# Patient Record
Sex: Female | Born: 1975 | Race: White | Hispanic: No | Marital: Married | Smoking: Former smoker
Health system: Southern US, Community
[De-identification: ages and names within clinical notes are randomized; demographics above are authoritative.]

## PROBLEM LIST (undated history)

## (undated) DIAGNOSIS — K30 Functional dyspepsia: Secondary | ICD-10-CM

## (undated) DIAGNOSIS — G90A Postural orthostatic tachycardia syndrome (POTS): Secondary | ICD-10-CM

## (undated) DIAGNOSIS — E079 Disorder of thyroid, unspecified: Secondary | ICD-10-CM

## (undated) DIAGNOSIS — R51 Headache: Secondary | ICD-10-CM

## (undated) DIAGNOSIS — K219 Gastro-esophageal reflux disease without esophagitis: Secondary | ICD-10-CM

## (undated) DIAGNOSIS — I82A19 Acute embolism and thrombosis of unspecified axillary vein: Secondary | ICD-10-CM

## (undated) DIAGNOSIS — R000 Tachycardia, unspecified: Secondary | ICD-10-CM

## (undated) DIAGNOSIS — R112 Nausea with vomiting, unspecified: Secondary | ICD-10-CM

## (undated) DIAGNOSIS — K589 Irritable bowel syndrome without diarrhea: Secondary | ICD-10-CM

## (undated) DIAGNOSIS — F419 Anxiety disorder, unspecified: Secondary | ICD-10-CM

## (undated) DIAGNOSIS — T4145XA Adverse effect of unspecified anesthetic, initial encounter: Secondary | ICD-10-CM

## (undated) DIAGNOSIS — D573 Sickle-cell trait: Secondary | ICD-10-CM

## (undated) DIAGNOSIS — M519 Unspecified thoracic, thoracolumbar and lumbosacral intervertebral disc disorder: Secondary | ICD-10-CM

## (undated) DIAGNOSIS — N809 Endometriosis, unspecified: Secondary | ICD-10-CM

## (undated) DIAGNOSIS — J309 Allergic rhinitis, unspecified: Secondary | ICD-10-CM

## (undated) DIAGNOSIS — Q078 Other specified congenital malformations of nervous system: Secondary | ICD-10-CM

## (undated) DIAGNOSIS — I498 Other specified cardiac arrhythmias: Secondary | ICD-10-CM

## (undated) DIAGNOSIS — G43909 Migraine, unspecified, not intractable, without status migrainosus: Secondary | ICD-10-CM

## (undated) DIAGNOSIS — T8859XA Other complications of anesthesia, initial encounter: Secondary | ICD-10-CM

## (undated) DIAGNOSIS — Z9889 Other specified postprocedural states: Secondary | ICD-10-CM

## (undated) DIAGNOSIS — J302 Other seasonal allergic rhinitis: Secondary | ICD-10-CM

## (undated) DIAGNOSIS — I951 Orthostatic hypotension: Secondary | ICD-10-CM

## (undated) DIAGNOSIS — D649 Anemia, unspecified: Secondary | ICD-10-CM

## (undated) HISTORY — DX: Disorder of thyroid, unspecified: E07.9

## (undated) HISTORY — DX: Gastro-esophageal reflux disease without esophagitis: K21.9

## (undated) HISTORY — DX: Anemia, unspecified: D64.9

## (undated) HISTORY — DX: Headache: R51

## (undated) HISTORY — DX: Anxiety disorder, unspecified: F41.9

## (undated) HISTORY — DX: Other specified congenital malformations of nervous system: Q07.8

## (undated) HISTORY — DX: Allergic rhinitis, unspecified: J30.9

## (undated) HISTORY — DX: Irritable bowel syndrome, unspecified: K58.9

## (undated) HISTORY — DX: Unspecified thoracic, thoracolumbar and lumbosacral intervertebral disc disorder: M51.9

## (undated) HISTORY — DX: Sickle-cell trait: D57.3

## (undated) HISTORY — DX: Endometriosis, unspecified: N80.9

## (undated) HISTORY — DX: Other seasonal allergic rhinitis: J30.2

## (undated) HISTORY — DX: Acute embolism and thrombosis of unspecified axillary vein: I82.A19

## (undated) HISTORY — PX: WISDOM TOOTH EXTRACTION: SHX21

## (undated) HISTORY — DX: Functional dyspepsia: K30

---
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
## 1994-04-30 HISTORY — PX: TONSILLECTOMY: SUR1361

## 2000-12-05 ENCOUNTER — Other Ambulatory Visit: Admission: RE | Admit: 2000-12-05 | Discharge: 2000-12-05 | Payer: Self-pay | Admitting: Gynecology

## 2001-12-08 ENCOUNTER — Other Ambulatory Visit: Admission: RE | Admit: 2001-12-08 | Discharge: 2001-12-08 | Payer: Self-pay | Admitting: Gynecology

## 2002-12-08 ENCOUNTER — Other Ambulatory Visit: Admission: RE | Admit: 2002-12-08 | Discharge: 2002-12-08 | Payer: Self-pay | Admitting: Gynecology

## 2003-12-06 ENCOUNTER — Other Ambulatory Visit: Admission: RE | Admit: 2003-12-06 | Discharge: 2003-12-06 | Payer: Self-pay | Admitting: Obstetrics and Gynecology

## 2004-06-20 ENCOUNTER — Inpatient Hospital Stay (HOSPITAL_COMMUNITY): Admission: AD | Admit: 2004-06-20 | Discharge: 2004-06-23 | Payer: Self-pay | Admitting: Obstetrics and Gynecology

## 2005-05-23 ENCOUNTER — Encounter: Admission: RE | Admit: 2005-05-23 | Discharge: 2005-05-23 | Payer: Self-pay | Admitting: Internal Medicine

## 2005-10-10 ENCOUNTER — Other Ambulatory Visit: Admission: RE | Admit: 2005-10-10 | Discharge: 2005-10-10 | Payer: Self-pay | Admitting: Obstetrics and Gynecology

## 2006-05-03 ENCOUNTER — Emergency Department (HOSPITAL_COMMUNITY): Admission: EM | Admit: 2006-05-03 | Discharge: 2006-05-03 | Payer: Self-pay | Admitting: Family Medicine

## 2006-12-10 ENCOUNTER — Emergency Department (HOSPITAL_COMMUNITY): Admission: EM | Admit: 2006-12-10 | Discharge: 2006-12-11 | Payer: Self-pay | Admitting: Emergency Medicine

## 2007-01-10 ENCOUNTER — Ambulatory Visit: Payer: Self-pay | Admitting: Internal Medicine

## 2007-01-14 ENCOUNTER — Ambulatory Visit: Payer: Self-pay | Admitting: Internal Medicine

## 2007-01-14 LAB — CONVERTED CEMR LAB
AST: 22 units/L (ref 0–37)
Albumin: 4.1 g/dL (ref 3.5–5.2)
Amylase: 73 units/L (ref 27–131)
Bilirubin Urine: NEGATIVE
Bilirubin, Direct: 0.1 mg/dL (ref 0.0–0.3)
CO2: 31 meq/L (ref 19–32)
Cortisol, Plasma: 20.6 ug/dL
Creatinine, Ser: 0.7 mg/dL (ref 0.4–1.2)
Eosinophils Absolute: 0 10*3/uL (ref 0.0–0.6)
Eosinophils Relative: 0.3 % (ref 0.0–5.0)
GFR calc Af Amer: 125 mL/min
GFR calc non Af Amer: 103 mL/min
HCT: 41.3 % (ref 36.0–46.0)
MCV: 83.4 fL (ref 78.0–100.0)
Mono Screen: NEGATIVE
Monocytes Relative: 5.8 % (ref 3.0–11.0)
Neutro Abs: 6.9 10*3/uL (ref 1.4–7.7)
Neutrophils Relative %: 76.2 % (ref 43.0–77.0)
Nitrite: NEGATIVE
Platelets: 226 10*3/uL (ref 150–400)
Potassium: 4.4 meq/L (ref 3.5–5.1)
RDW: 11.5 % (ref 11.5–14.6)
Sodium: 142 meq/L (ref 135–145)
Specific Gravity, Urine: <=1.005 (ref 1.000–1.03)
TSH: 2.85 microintl units/mL (ref 0.35–5.50)
Total Bilirubin: 0.9 mg/dL (ref 0.3–1.2)
Total Protein: 7.3 g/dL (ref 6.0–8.3)
Urine Glucose: NEGATIVE mg/dL
WBC: 9 10*3/uL (ref 4.5–10.5)

## 2007-01-15 ENCOUNTER — Ambulatory Visit: Payer: Self-pay | Admitting: Internal Medicine

## 2007-01-22 ENCOUNTER — Ambulatory Visit: Payer: Self-pay | Admitting: Internal Medicine

## 2007-01-22 LAB — CONVERTED CEMR LAB
CO2: 31 meq/L (ref 19–32)
Calcium: 9.2 mg/dL (ref 8.4–10.5)
Chloride: 106 meq/L (ref 96–112)
Potassium: 3.6 meq/L (ref 3.5–5.1)

## 2007-02-03 ENCOUNTER — Ambulatory Visit: Payer: Self-pay | Admitting: Internal Medicine

## 2007-02-03 LAB — CONVERTED CEMR LAB
BUN: 8 mg/dL (ref 6–23)
CO2: 30 meq/L (ref 19–32)
Calcium: 9.1 mg/dL (ref 8.4–10.5)
Chloride: 100 meq/L (ref 96–112)
Sodium: 137 meq/L (ref 135–145)

## 2007-02-13 ENCOUNTER — Ambulatory Visit: Payer: Self-pay | Admitting: Internal Medicine

## 2007-03-21 ENCOUNTER — Ambulatory Visit: Payer: Self-pay | Admitting: Internal Medicine

## 2007-03-24 ENCOUNTER — Ambulatory Visit: Payer: Self-pay | Admitting: Internal Medicine

## 2007-03-24 LAB — CONVERTED CEMR LAB
Bilirubin Urine: NEGATIVE
CO2: 30 meq/L (ref 19–32)
Creatinine, Ser: 0.5 mg/dL (ref 0.4–1.2)
Crystals: NEGATIVE
GFR calc Af Amer: 184 mL/min
GFR calc non Af Amer: 152 mL/min
Glucose, Bld: 99 mg/dL (ref 70–99)
Potassium: 3.9 meq/L (ref 3.5–5.1)
RBC / HPF: NONE SEEN
Sodium: 139 meq/L (ref 135–145)
Specific Gravity, Urine: <=1.005 (ref 1.000–1.03)
Total Protein, Urine: NEGATIVE mg/dL
Urine Glucose: NEGATIVE mg/dL
Urobilinogen, UA: 0.2 (ref 0.0–1.0)
pH: 5.5 (ref 5.0–8.0)

## 2007-04-07 ENCOUNTER — Ambulatory Visit: Payer: Self-pay | Admitting: Internal Medicine

## 2007-04-22 ENCOUNTER — Ambulatory Visit (HOSPITAL_COMMUNITY): Admission: RE | Admit: 2007-04-22 | Discharge: 2007-04-22 | Payer: Self-pay | Admitting: Internal Medicine

## 2007-05-29 ENCOUNTER — Ambulatory Visit: Payer: Self-pay | Admitting: Internal Medicine

## 2007-05-29 LAB — CONVERTED CEMR LAB
Basophils Relative: 0.1 % (ref 0.0–1.0)
GFR calc Af Amer: 125 mL/min
Glucose, Bld: 86 mg/dL (ref 70–99)
MCHC: 34.5 g/dL (ref 30.0–36.0)
Magnesium: 2 mg/dL (ref 1.5–2.5)
Monocytes Relative: 7.4 % (ref 3.0–11.0)

## 2007-06-05 ENCOUNTER — Ambulatory Visit: Payer: Self-pay | Admitting: Internal Medicine

## 2007-06-05 LAB — CONVERTED CEMR LAB
BUN: 5 mg/dL — ABNORMAL LOW (ref 6–23)
Calcium: 9 mg/dL (ref 8.4–10.5)
Chloride: 107 meq/L (ref 96–112)
Creatinine, Ser: 0.7 mg/dL (ref 0.4–1.2)
GFR calc Af Amer: 125 mL/min
GFR calc non Af Amer: 103 mL/min
Sodium: 141 meq/L (ref 135–145)

## 2007-06-23 ENCOUNTER — Ambulatory Visit: Payer: Self-pay | Admitting: Internal Medicine

## 2007-06-23 LAB — CONVERTED CEMR LAB
CO2: 29 meq/L (ref 19–32)
Calcium: 9.2 mg/dL (ref 8.4–10.5)
Chloride: 103 meq/L (ref 96–112)
Creatinine, Ser: 0.7 mg/dL (ref 0.4–1.2)
GFR calc Af Amer: 125 mL/min
Magnesium: 2 mg/dL (ref 1.5–2.5)
Total CK: 66 units/L (ref 7–177)

## 2007-07-21 DIAGNOSIS — N809 Endometriosis, unspecified: Secondary | ICD-10-CM | POA: Insufficient documentation

## 2007-08-25 ENCOUNTER — Emergency Department (HOSPITAL_COMMUNITY): Admission: EM | Admit: 2007-08-25 | Discharge: 2007-08-26 | Payer: Self-pay | Admitting: Emergency Medicine

## 2007-08-28 ENCOUNTER — Ambulatory Visit: Payer: Self-pay | Admitting: Internal Medicine

## 2007-09-04 ENCOUNTER — Telehealth: Payer: Self-pay | Admitting: Internal Medicine

## 2007-09-08 ENCOUNTER — Ambulatory Visit: Payer: Self-pay | Admitting: Internal Medicine

## 2007-09-08 DIAGNOSIS — K589 Irritable bowel syndrome without diarrhea: Secondary | ICD-10-CM | POA: Insufficient documentation

## 2007-09-09 LAB — CONVERTED CEMR LAB
Eosinophils Absolute: 0.1 10*3/uL (ref 0.0–0.7)
Eosinophils Relative: 0.7 % (ref 0.0–5.0)
Ferritin: 22.4 ng/mL (ref 10.0–291.0)
HCT: 40.6 % (ref 36.0–46.0)
Neutro Abs: 5.1 10*3/uL (ref 1.4–7.7)
RBC: 4.89 M/uL (ref 3.87–5.11)
WBC: 7.9 10*3/uL (ref 4.5–10.5)

## 2007-12-17 ENCOUNTER — Ambulatory Visit: Payer: Self-pay | Admitting: Cardiology

## 2007-12-26 ENCOUNTER — Ambulatory Visit: Payer: Self-pay | Admitting: Internal Medicine

## 2007-12-26 DIAGNOSIS — G90A Postural orthostatic tachycardia syndrome (POTS): Secondary | ICD-10-CM | POA: Insufficient documentation

## 2007-12-26 DIAGNOSIS — I951 Orthostatic hypotension: Secondary | ICD-10-CM

## 2007-12-26 DIAGNOSIS — D649 Anemia, unspecified: Secondary | ICD-10-CM

## 2007-12-26 DIAGNOSIS — R000 Tachycardia, unspecified: Secondary | ICD-10-CM

## 2008-01-01 ENCOUNTER — Encounter: Admission: RE | Admit: 2008-01-01 | Discharge: 2008-01-01 | Payer: Self-pay | Admitting: Internal Medicine

## 2008-01-02 ENCOUNTER — Telehealth: Payer: Self-pay | Admitting: Internal Medicine

## 2008-01-06 ENCOUNTER — Telehealth: Payer: Self-pay | Admitting: Internal Medicine

## 2008-03-09 ENCOUNTER — Telehealth: Payer: Self-pay | Admitting: Internal Medicine

## 2008-03-15 ENCOUNTER — Encounter: Payer: Self-pay | Admitting: Internal Medicine

## 2008-03-23 ENCOUNTER — Ambulatory Visit: Payer: Self-pay | Admitting: Internal Medicine

## 2008-04-20 ENCOUNTER — Ambulatory Visit: Payer: Self-pay | Admitting: Internal Medicine

## 2008-04-20 LAB — CONVERTED CEMR LAB: Iron: 167 ug/dL — ABNORMAL HIGH (ref 42–145)

## 2008-04-30 HISTORY — PX: COLONOSCOPY: SHX174

## 2008-05-03 ENCOUNTER — Telehealth: Payer: Self-pay | Admitting: Internal Medicine

## 2008-05-17 ENCOUNTER — Ambulatory Visit: Payer: Self-pay | Admitting: Internal Medicine

## 2008-06-10 ENCOUNTER — Ambulatory Visit: Payer: Self-pay | Admitting: Internal Medicine

## 2008-09-14 ENCOUNTER — Encounter: Payer: Self-pay | Admitting: Internal Medicine

## 2008-10-06 ENCOUNTER — Encounter: Payer: Self-pay | Admitting: Internal Medicine

## 2008-11-03 ENCOUNTER — Telehealth: Payer: Self-pay | Admitting: Internal Medicine

## 2008-11-05 ENCOUNTER — Encounter: Payer: Self-pay | Admitting: Internal Medicine

## 2008-11-16 ENCOUNTER — Ambulatory Visit: Payer: Self-pay | Admitting: Internal Medicine

## 2008-11-26 ENCOUNTER — Encounter (INDEPENDENT_AMBULATORY_CARE_PROVIDER_SITE_OTHER): Payer: Self-pay | Admitting: *Deleted

## 2008-12-15 ENCOUNTER — Encounter: Payer: Self-pay | Admitting: Internal Medicine

## 2008-12-29 ENCOUNTER — Telehealth: Payer: Self-pay | Admitting: Internal Medicine

## 2008-12-31 ENCOUNTER — Encounter: Payer: Self-pay | Admitting: Internal Medicine

## 2009-01-28 ENCOUNTER — Encounter: Payer: Self-pay | Admitting: Internal Medicine

## 2009-02-17 ENCOUNTER — Ambulatory Visit: Payer: Self-pay | Admitting: Internal Medicine

## 2009-02-28 ENCOUNTER — Encounter: Payer: Self-pay | Admitting: Internal Medicine

## 2009-03-31 ENCOUNTER — Encounter: Payer: Self-pay | Admitting: Internal Medicine

## 2009-04-01 ENCOUNTER — Ambulatory Visit: Payer: Self-pay | Admitting: Internal Medicine

## 2009-07-11 ENCOUNTER — Encounter: Admission: RE | Admit: 2009-07-11 | Discharge: 2009-07-11 | Payer: Self-pay | Admitting: Obstetrics and Gynecology

## 2009-07-14 ENCOUNTER — Encounter: Payer: Self-pay | Admitting: Internal Medicine

## 2009-08-11 ENCOUNTER — Encounter: Payer: Self-pay | Admitting: Internal Medicine

## 2009-08-30 ENCOUNTER — Telehealth: Payer: Self-pay | Admitting: Internal Medicine

## 2009-09-09 ENCOUNTER — Encounter: Payer: Self-pay | Admitting: Internal Medicine

## 2009-10-27 ENCOUNTER — Encounter: Payer: Self-pay | Admitting: Internal Medicine

## 2009-11-17 ENCOUNTER — Encounter: Admission: RE | Admit: 2009-11-17 | Discharge: 2009-11-17 | Payer: Self-pay | Admitting: Orthopedic Surgery

## 2009-12-05 ENCOUNTER — Telehealth: Payer: Self-pay | Admitting: *Deleted

## 2009-12-26 ENCOUNTER — Encounter: Payer: Self-pay | Admitting: Internal Medicine

## 2010-01-17 ENCOUNTER — Encounter: Payer: Self-pay | Admitting: Internal Medicine

## 2010-01-18 ENCOUNTER — Encounter: Payer: Self-pay | Admitting: Internal Medicine

## 2010-01-22 ENCOUNTER — Encounter: Payer: Self-pay | Admitting: Internal Medicine

## 2010-01-23 ENCOUNTER — Encounter: Payer: Self-pay | Admitting: Internal Medicine

## 2010-01-25 ENCOUNTER — Telehealth: Payer: Self-pay | Admitting: Internal Medicine

## 2010-01-26 ENCOUNTER — Encounter: Payer: Self-pay | Admitting: Internal Medicine

## 2010-02-23 ENCOUNTER — Ambulatory Visit: Payer: Self-pay | Admitting: Internal Medicine

## 2010-04-05 ENCOUNTER — Ambulatory Visit: Payer: Self-pay | Admitting: Internal Medicine

## 2010-04-30 LAB — HM COLONOSCOPY

## 2010-05-03 ENCOUNTER — Ambulatory Visit
Admission: RE | Admit: 2010-05-03 | Discharge: 2010-05-03 | Payer: Self-pay | Source: Home / Self Care | Attending: Internal Medicine | Admitting: Internal Medicine

## 2010-05-03 DIAGNOSIS — J019 Acute sinusitis, unspecified: Secondary | ICD-10-CM | POA: Insufficient documentation

## 2010-05-03 DIAGNOSIS — J309 Allergic rhinitis, unspecified: Secondary | ICD-10-CM | POA: Insufficient documentation

## 2010-05-03 DIAGNOSIS — F411 Generalized anxiety disorder: Secondary | ICD-10-CM | POA: Insufficient documentation

## 2010-05-21 ENCOUNTER — Encounter: Payer: Self-pay | Admitting: Internal Medicine

## 2010-05-28 ENCOUNTER — Encounter: Payer: Self-pay | Admitting: Internal Medicine

## 2010-05-30 NOTE — Progress Notes (Signed)
Summary: refills  Phone Note From Pharmacy   Caller: [facility] [address]. [phone]* Reason for Call: Needs renewal Details for Reason: celexa and allegra Initial call taken by: Romualdo Bolk, CMA Duncan Dull),  December 05, 2009 11:38 AM  Follow-up for Phone Call        Rx sent to pharmacy Follow-up by: Romualdo Bolk, CMA Duncan Dull),  December 05, 2009 11:39 AM    Prescriptions: ALLEGRA-D 12 HOUR 60-120 MG  TB12 (FEXOFENADINE-PSEUDOEPHEDRINE) Take 1 tablet by mouth once a day  #90.0 Each x 0   Entered by:   Romualdo Bolk, CMA (AAMA)   Authorized by:   Gordy Savers  MD   Signed by:   Romualdo Bolk, CMA (AAMA) on 12/05/2009   Method used:   Electronically to        [facility] [address] [phone]* (retail)       [address]       [place], [place]  [address]       Ph: [phone] or [phone]       Fax: [phone]   RxID:   [id number] CELEXA 20 MG TABS (CITALOPRAM HYDROBROMIDE) Take 1 tablet by mouth once a day  #90 x 0   Entered by:   Romualdo Bolk, CMA (AAMA)   Authorized by:   Gordy Savers  MD   Signed by:   Romualdo Bolk, CMA (AAMA) on 12/05/2009   Method used:   Electronically to        [facility] [address] [phone]* (retail)       [address]       [place], [place]  [address]       Ph: [phone] or [phone]       Fax: [phone]   RxID:   [id number]

## 2010-05-30 NOTE — Miscellaneous (Signed)
Summary: Home Health Certification/Care Plan  Home Health Certification/Care Plan   Imported By: Roderic Ovens 08/03/2009 14:29:38  _____________________________________________________________________  External Attachment:    Type:   Image     Comment:   External Document

## 2010-05-30 NOTE — Letter (Signed)
Summary: Home Health Cert and Plan of Care  Home Health Cert and Plan of Care   Imported By: Marylou Mccoy 02/07/2010 12:01:37  _____________________________________________________________________  External Attachment:    Type:   Image     Comment:   External Document

## 2010-05-30 NOTE — Miscellaneous (Signed)
Summary: Flu Shot/[facility]  [facility]   Imported By: Maryln Gottron 01/20/2010 14:51:55  _____________________________________________________________________  External Attachment:    Type:   Image     Comment:   External Document

## 2010-05-30 NOTE — Progress Notes (Signed)
Summary: home care service -9 weeks   Phone Note From Other Clinic   [facility]er[facility] [phone]  Request: Talk with Nurse Summary of Call: pt getting home care service for next 9 weeks. Initial call taken by: Lorne Skeens,  Aug 30, 2009 11:28 AM  Follow-up for Phone Call        08/30/09--11:30 am--LMTCB--nt  returning call, please call main ofc [phone] ask to speak with the blue team, Migdalia Dk  Aug 30, 2009 11:45 AM   Additional Follow-up for Phone Call Additional follow up Details #1::        08/30/09--12noon--[facility](autumn)--calling about continuing fluid administation to ms younce for next 9 weeks--order was given to continue with fluid administration for next 9 weeks--nt Additional Follow-up by: Ledon Snare, RN,  Aug 30, 2009 12:18 PM

## 2010-05-30 NOTE — Assessment & Plan Note (Signed)
Summary: severe headaches//ccm    Vital Signs:  Patient profile:   35 year old female Weight:      156 pounds Temp:     98.2 degrees F oral BP sitting:   128 / 100  (right arm) Cuff size:   regular  Vitals Entered By: Duard Brady LPN (April 05, 2010 10:19 AM) CC: c/o headache x2wks , muscle pain, neck pain, nausea Is Patient Diabetic? No   Primary Care Provider:  Gordy Savers  MD  CC:  c/o headache x2wks , muscle pain, neck pain, and nausea.  History of Present Illness: 35 year old patient who has a history of dysautonomia.  The past couple weeks, she has had some neck pain associated with headaches.  She describes some sinus symptoms, and left ear discomfort.  There's been no fever.  Her chief complaint today is headaches.  She has prescribed recently anti-inflammatory medication for her low back, which he is not started taking it.  She has been using Advil p.r.n. with some benefit.  She does have muscle relaxers, that she takes with improvement.  Allergies: 1)  Pcn 2)  Sulfa 3)  Erythromycin 4)  Amoxicillin  Past History:  Past Medical History: Reviewed history from 06/10/2008 and no changes required. ENDOMETRIOSIS (ICD-617.9) Dysautonomia (POTS) Seasonal allergic rhinitis lumbar disk disease Anemia-NOS Headache gravida one, para one, abortus zero  Past Surgical History: Reviewed history from 07/21/2007 and no changes required. Ankle Reconstruction (1993) Tonsillectomy (1996)  Review of Systems       The patient complains of anorexia and headaches.  The patient denies fever, weight loss, weight gain, vision loss, decreased hearing, hoarseness, chest pain, syncope, dyspnea on exertion, peripheral edema, prolonged cough, hemoptysis, abdominal pain, melena, hematochezia, severe indigestion/heartburn, hematuria, incontinence, genital sores, muscle weakness, suspicious skin lesions, transient blindness, difficulty walking, depression, unusual weight  change, abnormal bleeding, enlarged lymph nodes, angioedema, and breast masses.    Physical Exam  General:  overweight-appearing.  110/70 Head:  Normocephalic and atraumatic without obvious abnormalities. No apparent alopecia or balding. Eyes:  No corneal or conjunctival inflammation noted. EOMI. Perrla. Funduscopic exam benign, without hemorrhages, exudates or papilledema. Vision grossly normal. Ears:  External ear exam shows no significant lesions or deformities.  Otoscopic examination reveals clear canals, tympanic membranes are intact bilaterally without bulging, retraction, inflammation or discharge. Hearing is grossly normal bilaterally. Mouth:  Oral mucosa and oropharynx without lesions or exudates.  Teeth in good repair. Neck:  No deformities, masses, or tenderness noted. Lungs:  Normal respiratory effort, chest expands symmetrically. Lungs are clear to auscultation, no crackles or wheezes. Heart:  Normal rate and regular rhythm. S1 and S2 normal without gallop, murmur, click, rub or other extra sounds. Abdomen:  Bowel sounds positive,abdomen soft and non-tender without masses, organomegaly or hernias noted.   Impression & Recommendations:  Problem # 1:  DYSAUTONOMIA (ICD-742.8)  Problem # 2:  HEADACHE (ICD-784.0)  Her updated medication list for this problem includes:    Pindolol 5 Mg Tabs (Pindolol) .Marland Kitchen... Take one tablet by mouth twice daily. probable secondary to musculoligamentous neck pain-  Will start anti-inflammatories and will observe  Complete Medication List: 1)  Celexa 20 Mg Tabs (Citalopram hydrobromide) .... Take 1 tablet by mouth once a day 2)  Magnesium Oxide 400 Mg Tabs (Magnesium oxide) .Marland Kitchen.. 1 tablet by mouth once a day 3)  Fludrocortisone Acetate 0.1 Mg Tabs (Fludrocortisone acetate) .Marland Kitchen.. 1 1/2 tabs two times a day 4)  Pindolol 5 Mg Tabs (Pindolol) .... Take  one tablet by mouth twice daily. 5)  Mestinon 180 Mg Cr-tabs (Pyridostigmine bromide) .Marland Kitchen.. 1 tab once  daily 6)  Allegra-d 12 Hour 60-120 Mg Tb12 (Fexofenadine-pseudoephedrine) .... Take 1 tablet by mouth once a day 7)  Yaz 3-0.02 Mg Tabs (Drospirenone-ethinyl estradiol) .... As directed 8)  Anusol-hc 25 Mg Supp (Hydrocortisone acetate) .Marland Kitchen.. 1 at bedtime for 7 nights then as needed 9)  Miralax Pack (Polyethylene glycol 3350) .... Q am 10)  Curity Sterile Saline 0.9 % Soln (Sodium chloride (gu irrigant)) .... Q week 11)  Rhinocort Aqua 32 Mcg/act Susp (Budesonide) .... Use daily 12)  Cobal-1000 1000 Mcg/ml Soln (Cyanocobalamin) .Marland Kitchen.. 1 injection monthly 13)  Nu-iron 150 Mg Caps (Polysaccharide iron complex) .Marland Kitchen.. 1 cap once daily 14)  Cyclobenzaprine Hcl 5 Mg Tabs (Cyclobenzaprine hcl) .Marland Kitchen.. 1-2 tabs q 8 hours as needed 15)  Colace 100 Mg Caps (Docusate sodium) .... Once daily and prn  Patient Instructions: 1)  Please schedule a follow-up appointment in 3 months. 2)  call if unimproved in two or 3 days   Orders Added: 1)  Est. Patient Level III [28413]

## 2010-05-30 NOTE — Progress Notes (Signed)
Summary: refill celexa and allergra  Phone Note Refill Request Message from:  Fax from Pharmacy on January 25, 2010 9:48 AM  Refills Requested: Medication #1:  ALLEGRA-D 12 HOUR 60-120 MG  TB12 Take 1 tablet by mouth once a day  Medication #2:  CELEXA 20 MG TABS Take 1 tablet by mouth once a day [facility]   Method Requested: Fax to Local Pharmacy Initial call taken by: Duard Brady LPN,  January 25, 2010 9:49 AM    Prescriptions: ALLEGRA-D 12 HOUR 60-120 MG  TB12 (FEXOFENADINE-PSEUDOEPHEDRINE) Take 1 tablet by mouth once a day  #90.0 Each x 1   Entered by:   Duard Brady LPN   Authorized by:   Gordy Savers  MD   Signed by:   Duard Brady LPN on 09/81/1914   Method used:   Faxed to ...       [facility] (mail-order)             , [place]         Ph: [phone]       Fax: [phone]   RxID:   [id number] CELEXA 20 MG TABS (CITALOPRAM HYDROBROMIDE) Take 1 tablet by mouth once a day  #90 x 1   Entered by:   Duard Brady LPN   Authorized by:   Gordy Savers  MD   Signed by:   Duard Brady LPN on 53/66/4403   Method used:   Faxed to ...       [facility] (mail-order)             , [place]         Ph: [phone]       Fax: [phone]   RxID:   [id number]  faxed to Toni Arthurs

## 2010-05-30 NOTE — Letter (Signed)
[facility]   Imported By: Marylou Mccoy 11/22/2009 13:35:50  _____________________________________________________________________  External Attachment:    Type:   Image     Comment:   External Document

## 2010-05-30 NOTE — Miscellaneous (Signed)
Summary: Home Health Certification/Care Plan  Home Health Certification/Care Plan   Imported By: Roderic Ovens 10/28/2009 11:36:50  _____________________________________________________________________  External Attachment:    Type:   Image     Comment:   External Document

## 2010-05-30 NOTE — Miscellaneous (Signed)
Summary: flu vaccine   Clinical Lists Changes  Observations: Added new observation of FLU VAX: Historical (01/17/2010 7:55)      Immunization History:  Influenza Immunization History:    Influenza:  Historical (01/17/2010) given at [facility]

## 2010-05-30 NOTE — Assessment & Plan Note (Signed)
Summary: yearly/sl  Medications Added PINDOLOL 5 MG  TABS (PINDOLOL) Take one tablet by mouth twice daily. MESTINON 180 MG CR-TABS (PYRIDOSTIGMINE BROMIDE) 1 tab once daily COBAL-1000 1000 MCG/ML SOLN (CYANOCOBALAMIN) 1 injection monthly NU-IRON 150 MG CAPS (POLYSACCHARIDE IRON COMPLEX) 1 cap once daily CYCLOBENZAPRINE HCL 5 MG TABS (CYCLOBENZAPRINE HCL) 1-2 tabs q 8 hours as needed        Visit Type:  1 yr f/u Primary Provider:  Gordy Savers  MD  CC:  sob frequently....edema/everywhere....denies any cp....pt states she has had headaches for the past couple of days says due to allergies.  History of Present Illness: patient is a 35 year old with a history of dysautonomia.  I saw her in the [place] of 2010. Since then she has done fairly well.  She continues on IV fluids every other week. She still notes dizziness at times but not as bad as in the past.  Continues to note nausea.  Still has intermiitent headaches.  Continues to have cramps in hands, legs.  electrolytes were checked and normal.   She remains very active.  She is working 4 hrs per day.  Daughter is in 1st grade.  Keeps up with her activites.  Current Medications (verified): 1)  Celexa 20 Mg Tabs (Citalopram Hydrobromide) .... Take 1 Tablet By Mouth Once A Day 2)  Magnesium Oxide 400 Mg Tabs (Magnesium Oxide) .Marland Kitchen.. 1 Tablet By Mouth Once A Day 3)  Fludrocortisone Acetate 0.1 Mg  Tabs (Fludrocortisone Acetate) .Marland Kitchen.. 1 1/2 Tabs Two Times A Day 4)  Pindolol 5 Mg  Tabs (Pindolol) .... Take One Tablet By Mouth Twice Daily. 5)  Mestinon 180 Mg Cr-Tabs (Pyridostigmine Bromide) .Marland Kitchen.. 1 Tab Once Daily 6)  Allegra-D 12 Hour 60-120 Mg  Tb12 (Fexofenadine-Pseudoephedrine) .... Take 1 Tablet By Mouth Once A Day 7)  Yaz 3-0.02 Mg  Tabs (Drospirenone-Ethinyl Estradiol) .... As Directed 8)  Anusol-Hc 25 Mg  Supp (Hydrocortisone Acetate) .Marland Kitchen.. 1 At Bedtime For 7 Nights Then As Needed 9)  Miralax  Pack (Polyethylene Glycol 3350) .... Q  Am 10)  Phillips 500 Mg Tabs (Magnesium Oxide) .Marland Kitchen.. 1 At Bedtime 11)  Curity Sterile Saline 0.9 % Soln (Sodium Chloride (Gu Irrigant)) .... Q Week 12)  Rhinocort Aqua 32 Mcg/act Susp (Budesonide) .... Use Daily 13)  Cobal-1000 1000 Mcg/ml Soln (Cyanocobalamin) .Marland Kitchen.. 1 Injection Monthly 14)  Nu-Iron 150 Mg Caps (Polysaccharide Iron Complex) .Marland Kitchen.. 1 Cap Once Daily 15)  Cyclobenzaprine Hcl 5 Mg Tabs (Cyclobenzaprine Hcl) .Marland Kitchen.. 1-2 Tabs Q 8 Hours As Needed  Allergies: 1)  Pcn 2)  Sulfa 3)  Erythromycin 4)  Amoxicillin  Past History:  Past medical, surgical, family and social histories (including risk factors) reviewed, and no changes noted (except as noted below).  Past Medical History: Reviewed history from 06/10/2008 and no changes required. ENDOMETRIOSIS (ICD-617.9) Dysautonomia (POTS) Seasonal allergic rhinitis lumbar disk disease Anemia-NOS Headache gravida one, para one, abortus zero  Past Surgical History: Reviewed history from 07/21/2007 and no changes required. Ankle Reconstruction (1993) Tonsillectomy (1996)  Family History: Reviewed history from 12/26/2007 and no changes required. No FH of Colon Cancer: details of her biological father's health unknown mother, age 36, status post cholecystectomy, history of hypo-thyroidism, and history of migraine headaches  One biological sister is well  Social History: Reviewed history from 09/08/2007 and no changes required. Occupation: admin work part-time  Patient is a former smoker.  Alcohol Use - yes occasional wine Married 1 child (girl) Dad is Majel Homer  Review of Systems       All systems reviewed.  Neg to the above problem except as noted abvoe.  Vital Signs:  Patient profile:   35 year old female Height:      66 inches Weight:      158 pounds BMI:     25.59 Pulse rate:   69 / minute Pulse (ortho):   75 / minute Pulse rhythm:   regular BP sitting:   122 / 80  (left arm) BP standing:   122 / 84 Cuff  size:   regular  Vitals Entered By: Danielle Rankin, CMA (February 23, 2010 4:18 PM)  Serial Vital Signs/Assessments:  Time      Position  BP       Pulse  Resp  Temp     By 4:30 PM   Lying [place]  122/80   69                    Danielle Rankin, CMA 4:31 PM   Sitting   126/82   [address] [place] 4:32 PM   Standing  122/84   [address], [place] 4:34 PM   Standing  127/85   [address], [place] 4:35 PM   Standing  124/84   79                    Danielle Rankin, [place]  Comments: 4:30 PM no sxms By: Danielle Rankin, CMA  4:31 PM pt states feels like her head is going to explode By: Danielle Rankin, CMA  4:32 PM no sxms By: Danielle Rankin, CMA  4:34 PM no sxms By: Danielle Rankin, CMA  4:35 PM no sxms By: Danielle Rankin, CMA    Physical Exam  Additional Exam:  Patinet is in NAD HEENT:  Normocephalic, atraumatic. EOMI, PERRLA.  Neck: JVP is normal. No thyromegaly. No bruits.  Lungs: clear to auscultation. No rales no wheezes.  Heart: Regular rate and rhythm. Normal S1, S2. No S3.   No significant murmurs. PMI not displaced.  Abdomen:  Supple, nontender. Normal bowel sounds. No masses. No hepatomegaly.  Extremities:   Good distal pulses throughout. No lower extremity edema.  Musculoskeletal :moving all extremities.  Neuro:   alert and oriented x3.    Impression & Recommendations:  Problem # 1:  DYSAUTONOMIA (ICD-742.8) patient has done fairly well this past year.  I would keep her on the same regimen for now.  I would recomm that she stays active.  Will look into protocols for bike/rowing.

## 2010-05-30 NOTE — Miscellaneous (Signed)
Summary: [facility] Verbal Orders  [facility]   Imported By: Roderic Ovens 03/06/2010 09:04:35  _____________________________________________________________________  External Attachment:    Type:   Image     Comment:   External Document

## 2010-05-30 NOTE — Letter (Signed)
Summary: [facility]   [facility]   Imported By: Marylou Mccoy 03/03/2010 14:54:23  _____________________________________________________________________  External Attachment:    Type:   Image     Comment:   External Document

## 2010-05-30 NOTE — Miscellaneous (Signed)
Summary: Home Health Certification/Care Plan  Home Health Certification/Care Plan   Imported By: Roderic Ovens 08/24/2009 16:33:28  _____________________________________________________________________  External Attachment:    Type:   Image     Comment:   External Document

## 2010-05-31 ENCOUNTER — Encounter: Payer: Self-pay | Admitting: Internal Medicine

## 2010-06-01 NOTE — Assessment & Plan Note (Signed)
Summary: New/BCBS/#/cd   Vital Signs:  Patient profile:   35 year old female Height:      66 inches (167.64 cm) Weight:      158.4 pounds (72 kg) O2 Sat:      98 % on Room air Temp:     98.9 degrees F (37.17 degrees C) oral Pulse rate:   69 / minute BP sitting:   118 / 90  (left arm) Cuff size:   regular  Vitals Entered By: Orlan Leavens RMA (May 03, 2010 9:41 AM)  O2 Flow:  Room air CC: New patient, URI symptoms Is Patient Diabetic? No Pain Assessment Patient in pain? no      Comments C/o nasal & chest congestion, been taking Thera flu ovc not getting better   Primary Care Provider:  Newt Lukes MD  CC:  New patient and URI symptoms.  History of Present Illness: new pt to me but known to our diivison - transfer from [place] office where she followed with PK  URI Symptoms      This is a 35 year old woman who presents with URI symptoms.  The symptoms began 3 weeks ago.  The severity is described as moderate.  not improved with otc meds in past 1 week.  The patient reports nasal congestion, purulent nasal discharge, sore throat, productive cough, and earache, but denies clear nasal discharge and dry cough.  Associated symptoms include fever, use of an antipyretic, and response to antipyretic.  The patient denies dyspnea, wheezing, vomiting, and diarrhea.  The patient also reports itchy watery eyes, itchy throat, headache, muscle aches, and severe fatigue.  The patient denies seasonal symptoms and response to antihistamine.  Risk factors for Strep sinusitis include tooth pain.  The patient denies the following risk factors for Strep sinusitis: Strep exposure, tender adenopathy, and absence of cough.    also reviewed chronic med issues 1) dysautonomia -dx 2008 by symptoms locally, clarified dx 2009 at [facility] with neuro - med tx controlling most sy (fludrocort, pindolol, mestinon and cymbalta) - no recent changes in meds or symptoms - IVF every weekend at home via PIV and  HHRN (2L for symptom mgmt)  2) IBS and "GI" problems - relates to autonomic dysfx as for POTS (see above) - follows with GI specialist in autonomic dysfx at [facility] for same - no change in symptoms, bowels or meds - take Mg, iron and B12 shots for same  3) allg rhinitis - infreq use of otc antihist/decong or nasal spray - worse seasonally -  Preventive Screening-Counseling & Management  Alcohol-Tobacco     Alcohol drinks/day: <1     Alcohol Counseling: not indicated; use of alcohol is not excessive or problematic     Smoking Status: never     Tobacco Counseling: not indicated; no tobacco use  Caffeine-Diet-Exercise     Does Patient Exercise: no     Exercise Counseling: to improve exercise regimen     Depression Counseling: not indicated; screening negative for depression  Safety-Violence-Falls     Seat Belt Counseling: not indicated; patient wears seat belts     Helmet Counseling: not indicated; patient wears helmet when riding bicycle/motocycle     Violence Counseling: not indicated; no violence risk noted     Fall Risk Counseling: not indicated; no significant falls noted  Clinical Review Panels:  Immunizations   Last Flu Vaccine:  Historical (01/17/2010)  CBC   WBC:  7.9 (09/08/2007)   RBC:  4.89 (09/08/2007)  Hgb:  13.7 (09/08/2007)   Hct:  40.6 (09/08/2007)   Platelets:  247 (09/08/2007)   MCV  83.1 (09/08/2007)   MCHC  33.7 (09/08/2007)   RDW  11.8 (09/08/2007)   PMN:  65.8 (09/08/2007)   Lymphs:  27.3 (09/08/2007)   Monos:  5.9 (09/08/2007)   Eosinophils:  0.7 (09/08/2007)   Basophil:  0.3 (09/08/2007)  Complete Metabolic Panel   Glucose:  91 (06/23/2007)   Sodium:  138 (06/23/2007)   Potassium:  3.6 (06/23/2007)   Chloride:  103 (06/23/2007)   CO2:  29 (06/23/2007)   BUN:  5 (06/23/2007)   Creatinine:  0.7 (06/23/2007)   Albumin:  4.1 (01/14/2007)   Total Protein:  7.3 (01/14/2007)   Calcium:  9.2 (06/23/2007)   Total Bili:  0.9 (01/14/2007)   Alk  Phos:  42 (01/14/2007)   SGPT (ALT):  32 (01/14/2007)   SGOT (AST):  22 (01/14/2007)   Current Medications (verified): 1)  Celexa 20 Mg Tabs (Citalopram Hydrobromide) .... Take 1 Tablet By Mouth Once A Day 2)  Magnesium Oxide 400 Mg Tabs (Magnesium Oxide) .Marland Kitchen.. 1 Tablet By Mouth Once A Day 3)  Fludrocortisone Acetate 0.1 Mg  Tabs (Fludrocortisone Acetate) .Marland Kitchen.. 1 1/2 Tabs Two Times A Day 4)  Pindolol 5 Mg  Tabs (Pindolol) .... Take One Tablet By Mouth Twice Daily. 5)  Mestinon 180 Mg Cr-Tabs (Pyridostigmine Bromide) .Marland Kitchen.. 1 Tab Once Daily 6)  Allegra-D 12 Hour 60-120 Mg  Tb12 (Fexofenadine-Pseudoephedrine) .... Take 1 Tablet By Mouth Once A Day 7)  Yaz 3-0.02 Mg  Tabs (Drospirenone-Ethinyl Estradiol) .... As Directed 8)  Anusol-Hc 25 Mg  Supp (Hydrocortisone Acetate) .Marland Kitchen.. 1 At Bedtime For 7 Nights Then As Needed 9)  Miralax  Pack (Polyethylene Glycol 3350) .... Q Am 10)  Curity Sterile Saline 0.9 % Soln (Sodium Chloride (Gu Irrigant)) .... Q Week 11)  Rhinocort Aqua 32 Mcg/act Susp (Budesonide) .... Use Daily 12)  Cobal-1000 1000 Mcg/ml Soln (Cyanocobalamin) .Marland Kitchen.. 1 Injection Monthly 13)  Nu-Iron 150 Mg Caps (Polysaccharide Iron Complex) .Marland Kitchen.. 1 Cap Once Daily 14)  Cyclobenzaprine Hcl 5 Mg Tabs (Cyclobenzaprine Hcl) .Marland Kitchen.. 1-2 Tabs Q 8 Hours As Needed 15)  Colace 100 Mg Caps (Docusate Sodium) .... Once Daily and Prn 16)  Azithromycin 250 Mg Tabs (Azithromycin) .... 2 Tabs By Mouth Today, Then 1 By Mouth Daily Starting Tomorrow 17)  Hydromet 5-1.5 Mg/60ml Syrp (Hydrocodone-Homatropine) .... 5 Cc Poi Every 4 Hours As Needed For Cough, Esp At Bedtime  Allergies (verified): 1)  Pcn 2)  Sulfa 3)  Erythromycin 4)  Amoxicillin  Past History:  Past Medical History: ENDOMETRIOSIS hx Dysautonomia (POTS) IBS with autonomic dysfx Seasonal allergic rhinitis lumbar disk disease Anemia-NOS Headache anxiety gravida one, para one, abortus zero  MD roster: card - [facility] neuro - klein at [facility] -  danielle myer, pa at [facility] - [facility] - gruber  Past Surgical History: Ankle Reconstruction (1993) Tonsillectomy (1996)   Family History: No FH of Colon Cancer: details of her biological father's health unknown mother, age 84, s/p cholecystectomy, hypothyroidism, and history of migraine headaches  One biological sister is well  Social History: 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 I have reviewed  all other systems and they were negative.   Physical Exam  General:  alert, well-developed, well-nourished, and cooperative to examination.    Head:  Normocephalic and atraumatic without obvious abnormalities. No apparent alopecia or balding. Eyes:  vision grossly intact; pupils equal, round and reactive to light.  conjunctiva and lids normal.   mild allg conjunctivitis Ears:  normal pinnae bilaterally, without erythema, swelling, or tenderness to palpation. TMs hazy with clear effusion, no cerumen impaction. Hearing grossly normal bilaterally  Nose:  mild sinus tenderness over maxillary region B - otherwise normal Mouth:  teeth and gums in good repair; mucous membranes moist, without lesions or ulcers. oropharynx clear without exudate, mild erythema and PND Neck:  supple, full ROM, no masses, no thyromegaly; no thyroid nodules or tenderness. no JVD or carotid bruits.   Lungs:  normal respiratory effort, no intercostal retractions or use of accessory muscles; normal breath sounds bilaterally - no crackles and no wheezes.    Heart:  normal rate, regular rhythm, no murmur, and no rub. BLE without edema. normal DP pulses and normal cap refill in all 4 extremities    Genitalia:  defer to gyn Msk:  No deformity or scoliosis noted of  thoracic or lumbar spine.   Neurologic:  alert & oriented X3 and cranial nerves II-XII symetrically intact.  strength normal in all extremities, sensation intact to light touch, and gait normal. speech fluent without dysarthria or aphasia; follows commands with good comprehension.  Skin:  no rashes, vesicles, ulcers, or erythema. No nodules or irregularity to palpation.  Psych:  Oriented X3, memory intact for recent and remote, normally interactive, good eye contact, not anxious appearing, not depressed appearing, and not agitated.      Impression & Recommendations:  Problem # 1:  ACUTE SINUSITIS, UNSPECIFIED (ICD-461.9)  tx acute infx atop chronic allg rhinitis - Zpak and hydromet for night cough - rx done resume antihist and regular nasal steroid spray use  Her updated medication list for this problem includes:    Allegra-d 12 Hour 60-120 Mg Tb12 (Fexofenadine-pseudoephedrine) .Marland Kitchen... Take 1 tablet by mouth once a day    Rhinocort Aqua 32 Mcg/act Susp (Budesonide) ..... Use daily    Azithromycin 250 Mg Tabs (Azithromycin) .Marland Kitchen... 2 tabs by mouth today, then 1 by mouth daily starting tomorrow    Hydromet 5-1.5 Mg/17ml Syrp (Hydrocodone-homatropine) .Marland KitchenMarland KitchenMarland KitchenMarland Kitchen 5 cc poi every 4 hours as needed for cough, esp at bedtime  Orders: Prescription Created Electronically [phone])  Problem # 2:  DYSAUTONOMIA (ICD-742.8) dx 2008/2009 - hx reviewed today symptoms well controlled with current mgmt - cont to follow with local cards and [facility] for same -  Problem # 3:  IRRITABLE BOWEL SYNDROME (ICD-564.1) autonomic dysfx complicating same - follows with GI at [facility] for same - no change meds rec -  cont mgmt as ongoing  Problem # 4:  ALLERGIC RHINITIS (ICD-477.9)  Her updated medication list for this problem includes:    Rhinocort Aqua 32 Mcg/act Susp (Budesonide) ..... Use daily  Discussed use of allergy medications and environmental measures.   Problem # 5:  ANXIETY (ICD-300.00)  Her updated  medication list for this problem includes:    Celexa 20 Mg Tabs (Citalopram hydrobromide) .Marland Kitchen... Take 1 tablet by mouth once a day  Time spent with patient 40 minutes, more than 50% of this time was spent counseling patient on sinus and allergy symptoms and review of POTS hx and meds as well as GI issues and various specialists  Complete  Medication List: 1)  Celexa 20 Mg Tabs (Citalopram hydrobromide) .... Take 1 tablet by mouth once a day 2)  Magnesium Oxide 400 Mg Tabs (Magnesium oxide) .Marland Kitchen.. 1 tablet by mouth once a day 3)  Fludrocortisone Acetate 0.1 Mg Tabs (Fludrocortisone acetate) .Marland Kitchen.. 1 1/2 tabs two times a day 4)  Pindolol 5 Mg Tabs (Pindolol) .... Take one tablet by mouth twice daily. 5)  Mestinon 180 Mg Cr-tabs (Pyridostigmine bromide) .Marland Kitchen.. 1 tab once daily 6)  Allegra-d 12 Hour 60-120 Mg Tb12 (Fexofenadine-pseudoephedrine) .... Take 1 tablet by mouth once a day 7)  Yaz 3-0.02 Mg Tabs (Drospirenone-ethinyl estradiol) .... As directed 8)  Anusol-hc 25 Mg Supp (Hydrocortisone acetate) .Marland Kitchen.. 1 at bedtime for 7 nights then as needed 9)  Miralax Pack (Polyethylene glycol 3350) .... Q am 10)  Curity Sterile Saline 0.9 % Soln (Sodium chloride (gu irrigant)) .... Q week 11)  Rhinocort Aqua 32 Mcg/act Susp (Budesonide) .... Use daily 12)  Cobal-1000 1000 Mcg/ml Soln (Cyanocobalamin) .Marland Kitchen.. 1 injection monthly 13)  Nu-iron 150 Mg Caps (Polysaccharide iron complex) .Marland Kitchen.. 1 cap once daily 14)  Cyclobenzaprine Hcl 5 Mg Tabs (Cyclobenzaprine hcl) .Marland Kitchen.. 1-2 tabs q 8 hours as needed 15)  Colace 100 Mg Caps (Docusate sodium) .... Once daily and prn 16)  Azithromycin 250 Mg Tabs (Azithromycin) .... 2 tabs by mouth today, then 1 by mouth daily starting tomorrow 17)  Hydromet 5-1.5 Mg/19ml Syrp (Hydrocodone-homatropine) .... 5 cc poi every 4 hours as needed for cough, esp at bedtime  Patient Instructions: 1)  it was good to see you today. 2)  medical history and medications reviewed today 3)  Zpak  antibiotic and cough syrup for sinus symptoms - your prescriptions have been electronically submitted or faxed to your pharmacy. Please take as directed. Contact our office if you believe you're having problems with the medication(s).  4)  Get plenty of rest, drink lots of clear liquids, and use Tylenol or Ibuprofen for fever and comfort. Return in 7-10 days if you're not better:sooner if you're feeling  5)  Please schedule a follow-up appointment annually for medical physical and labs, call sooner if problems.  Prescriptions: HYDROMET 5-1.5 MG/5ML SYRP (HYDROCODONE-HOMATROPINE) 5 cc poi every 4 hours as needed for cough, esp at bedtime  #100cc x 0   Entered and Authorized by:   Newt Lukes MD   Signed by:   Newt Lukes MD on 05/03/2010   Method used:   Printed then faxed to ...       [facility] [address] [phone]* (retail)       [address]       [place], [place]  [address]       Ph: [phone] or [phone]       Fax: [phone]   RxID:   [id number] AZITHROMYCIN 250 MG TABS (AZITHROMYCIN) 2 tabs by mouth today, then 1 by mouth daily starting tomorrow  #6 x 0   Entered and Authorized by:   Newt Lukes MD   Signed by:   Newt Lukes MD on 05/03/2010   Method used:   Electronically to        [facility] [address] [phone]* (retail)       [address]       [place], [place]  [address]       Ph: [phone] or [phone]  Fax: [phone]   RxID:   [id number]    Orders Added: 1)  Est. Patient Level V [696[address]] 2)  Prescription Created Electronically [id number]

## 2010-06-27 NOTE — Letter (Signed)
Summary: Home Health Cert & Plan of Care  Home Health Cert & Plan of Care   Imported By: Marylou Mccoy 06/19/2010 13:29:03  _____________________________________________________________________  External Attachment:    Type:   Image     Comment:   External Document

## 2010-06-27 NOTE — Letter (Signed)
Summary: Medical Update & Patient Information  Medical Update & Patient Information   Imported By: Marylou Mccoy 06/22/2010 11:39:52  _____________________________________________________________________  External Attachment:    Type:   Image     Comment:   External Document

## 2010-07-31 ENCOUNTER — Ambulatory Visit (INDEPENDENT_AMBULATORY_CARE_PROVIDER_SITE_OTHER): Payer: BC Managed Care – PPO | Admitting: Internal Medicine

## 2010-07-31 ENCOUNTER — Encounter: Payer: Self-pay | Admitting: Internal Medicine

## 2010-07-31 DIAGNOSIS — D649 Anemia, unspecified: Secondary | ICD-10-CM

## 2010-07-31 DIAGNOSIS — G9009 Other idiopathic peripheral autonomic neuropathy: Secondary | ICD-10-CM

## 2010-07-31 DIAGNOSIS — Q078 Other specified congenital malformations of nervous system: Secondary | ICD-10-CM

## 2010-07-31 LAB — CBC WITH DIFFERENTIAL/PLATELET
Basophils Relative: 0.2 % (ref 0.0–3.0)
Eosinophils Relative: 2.3 % (ref 0.0–5.0)
HCT: 41.4 % (ref 36.0–46.0)
Monocytes Relative: 6.1 % (ref 3.0–12.0)
Neutrophils Relative %: 62.6 % (ref 43.0–77.0)
Platelets: 263 10*3/uL (ref 150.0–400.0)
RBC: 4.83 Mil/uL (ref 3.87–5.11)
WBC: 9.1 10*3/uL (ref 4.5–10.5)

## 2010-07-31 LAB — BASIC METABOLIC PANEL
BUN: 11 mg/dL (ref 6–23)
GFR: 130.36 mL/min (ref >60.00)
Potassium: 4.2 mEq/L (ref 3.5–5.1)

## 2010-07-31 MED ORDER — FLUDROCORTISONE 0.1 MG/ML ORAL SUSPENSION: 0.1000 mg | Freq: Two times a day (BID) | ORAL | Status: DC

## 2010-07-31 MED ORDER — PINDOLOL 5 MG PO TABS: 5.0000 mg | ORAL_TABLET | Freq: Two times a day (BID) | ORAL | Status: DC

## 2010-07-31 NOTE — Progress Notes (Signed)
HPI Patient is a 35 year old with a history of dysautonomia.  I saw her last in October. A few wks ago she developed on upper respiratory infection.  Since then she says she is feeling worse from her dysautonomia.  She has had more dizziness, fatigue, palpitations.  She denies syncope.  BP at home has been very labile (up to 180 at times).  Heart rate has also been labile, which is uncomfortable for her.  She does admit to using nasal decongestant.  Allergies  Allergen Reactions  . Amoxicillin   . Erythromycin   . Penicillins   . Sulfonamide Derivatives     Current Outpatient Prescriptions  Medication Sig Dispense Refill  . budesonide (RHINOCORT AQUA) 32 MCG/ACT nasal spray 1 spray by Nasal route as needed.        . citalopram (CELEXA) 20 MG tablet Take 20 mg by mouth daily.        . cyclobenzaprine (FLEXERIL) 10 MG tablet Take 10 mg by mouth 3 (three) times daily as needed.        . docusate sodium (COLACE) 100 MG capsule Take 100 mg by mouth 3 (three) times daily as needed.        . drospirenone-ethinyl estradiol (YAZ) 3-0.02 MG per tablet Take 1 tablet by mouth daily.        . fexofenadine (ALLEGRA) 180 MG tablet Take 60 mg by mouth as needed.        . fludrocortisone (FLORINEF) 0.1mg /mL SUSP 0.1 mg. Take 1 1/2 tablet twice a day       . iron polysaccharides (NU-IRON) 150 MG capsule Take 150 mg by mouth daily.        . Magnesium Oxide 400 (241.3 MG) MG TABS Take 1 tablet by mouth daily.        . pindolol (VISKEN) 5 MG tablet Take 5 mg by mouth.        . polyethylene glycol (MIRALAX / GLYCOLAX) packet Take 17 g by mouth daily.        Marland Kitchen pyridostigmine (MESTINON) 180 MG CR tablet Take 180 mg by mouth daily.        . sodium chloride (CURITY STERILE SALINE) 0.9 % irrigation Irrigate with as directed once.          No past medical history on file.  No past surgical history on file.  No family history on file.  History   Social History  . Marital Status: Married    Spouse Name: N/A     Number of Children: N/A  . Years of Education: N/A   Occupational History  . Not on file.   Social History Main Topics  . Smoking status: Former Smoker    Quit date: 04/30/1996  . Smokeless tobacco: Never Used  . Alcohol Use: No  . Drug Use: No  . Sexually Active: Not on file   Other Topics Concern  . Not on file   Social History Narrative  . No narrative on file    Review of Systems:  All systems reviewed.  They are negative to the above problem except as previously stated.  Vital Signs: BP 121/80  Pulse 89  Resp 18  Ht 5\' 6"  (1.676 m)  Wt 158 lb (71.668 kg)  BMI 25.50 kg/m2  Physical Exam  HEENT:  Normocephalic, atraumatic. EOMI, PERRLA.  Neck: JVP is normal. No thyromegaly. No bruits.  Lungs: clear to auscultation. No rales no wheezes.  Heart: Regular rate and rhythm. Normal S1, S2.  No S3.   No significant murmurs. PMI not displaced.  Abdomen:  Supple, nontender. Normal bowel sounds. No masses. No hepatomegaly.  Extremities:   Good distal pulses throughout. No lower extremity edema.  Musculoskeletal :moving all extremities.  Neuro:   alert and oriented x3.  CN II-XII grossly intact.   Assessment and Plan:

## 2010-07-31 NOTE — Patient Instructions (Signed)
Your physician wants you to follow-up in:  6 months. You will receive a reminder letter in the mail two months in advance. If you don't receive a letter, please call our office to schedule the follow-up appointment.   

## 2010-08-02 ENCOUNTER — Telehealth: Payer: Self-pay | Admitting: *Deleted

## 2010-08-02 ENCOUNTER — Other Ambulatory Visit: Payer: Self-pay | Admitting: *Deleted

## 2010-08-02 MED ORDER — FLUDROCORTISONE ACETATE 0.1 MG PO TABS: 0.1000 mg | ORAL_TABLET | Freq: Two times a day (BID) | ORAL | Status: DC

## 2010-08-02 NOTE — Telephone Encounter (Signed)
Called patient with results of bmet and cbc.

## 2010-08-03 NOTE — Assessment & Plan Note (Signed)
With her symptoms and her labile BP I would recommend:  1.  D/C decongestants which may be exacerbating symptoms.  2.  Cut back on Florinef to 1 tab 2x per day.  3.  INcrease Pindolol to 5/2.5 mg per day.   4.  Follow BP at home.  May consider low dose Klonopin for heart rate stabilization. Remain active.  She continues to get fluids IV  Check BMET.

## 2010-08-03 NOTE — Assessment & Plan Note (Signed)
Will check CBC 

## 2010-09-06 ENCOUNTER — Telehealth: Payer: Self-pay | Admitting: Internal Medicine

## 2010-09-06 NOTE — Telephone Encounter (Signed)
Not sure about refilling this medication will route this to Dr. Tenny Craw nurse to verify if its ok to fill this medication under her name

## 2010-09-06 NOTE — Telephone Encounter (Signed)
Pt needs refill of mestinon time span 180mg  uses [facility] [address], dr Tenny Craw agreed to manage med since neurologist quit practicing-pls call pt if any questions [phone]

## 2010-09-07 ENCOUNTER — Encounter: Payer: Self-pay | Admitting: Endocrinology

## 2010-09-07 ENCOUNTER — Other Ambulatory Visit: Payer: Self-pay | Admitting: *Deleted

## 2010-09-07 ENCOUNTER — Ambulatory Visit (INDEPENDENT_AMBULATORY_CARE_PROVIDER_SITE_OTHER): Payer: BC Managed Care – PPO | Admitting: Endocrinology

## 2010-09-07 DIAGNOSIS — R21 Rash and other nonspecific skin eruption: Secondary | ICD-10-CM

## 2010-09-07 MED ORDER — METHYLPREDNISOLONE (PAK) 4 MG PO TABS: ORAL_TABLET | ORAL | Status: AC

## 2010-09-07 MED ORDER — PYRIDOSTIGMINE BROMIDE ER 180 MG PO TBCR: 180.0000 mg | EXTENDED_RELEASE_TABLET | Freq: Every day | ORAL | Status: DC

## 2010-09-07 MED ORDER — CLOBETASOL PROPIONATE 0.05 % EX CREA: TOPICAL_CREAM | CUTANEOUS | Status: DC

## 2010-09-07 NOTE — Progress Notes (Signed)
  Subjective:    Patient ID: Unnamed Hino, female    DOB: Feb 20, 1976, 35 y.o.   MRN: [id number]  HPI 1 month of intermittent severe itching on the hands, neck, and face.  No assoc fever.   Past Medical History  Diagnosis Date  . ENDOMETRIOSIS 07/21/2007  . DYSAUTONOMIA 12/26/2007  . Irritable bowel syndrome 09/08/2007  . ALLERGIC RHINITIS 05/03/2010  . ANEMIA-NOS 12/26/2007  . Lumbar disc disease   . Headache     Past Surgical History  Procedure Date  . Ankle reconstruction 1993  . Tonsillectomy 1996    History   Social History  . Marital Status: Married    Spouse Name: N/A    Number of Children: 1  . Years of Education: N/A   Occupational History  . Property Management    Social History Main Topics  . Smoking status: Former Smoker    Quit date: 04/30/1996  . Smokeless tobacco: Never Used  . Alcohol Use: No  . Drug Use: No  . Sexually Active: Not on file   Other Topics Concern  . 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 budesonide (RHINOCORT AQUA) 32 MCG/ACT nasal spray 1 spray by Nasal route as needed.        . citalopram (CELEXA) 20 MG tablet Take 20 mg by mouth daily.        . cyclobenzaprine (FLEXERIL) 10 MG tablet 1-2 tablets every 8 hours as needed      . docusate sodium (COLACE) 100 MG capsule Once daily and as needed      . drospirenone-ethinyl estradiol (YAZ) 3-0.02 MG per tablet Take 1 tablet by mouth daily.        . iron polysaccharides (NU-IRON) 150 MG capsule Take 150 mg by mouth daily.        . Magnesium Oxide 400 (241.3 MG) MG TABS Take 1 tablet by mouth daily.        . pindolol (VISKEN) 5 MG tablet Take 1 tablet (5 mg total) by mouth 2 (two) times daily.  60 tablet  11    . polyethylene glycol (MIRALAX / GLYCOLAX) packet Take 17 g by mouth daily.        . sodium chloride (CURITY STERILE SALINE) 0.9 % irrigation Irrigate with as directed. Once weekly        Allergies  Allergen Reactions  . Amoxicillin   . Erythromycin   . Penicillins   . Sulfonamide Derivatives     Family History  Problem Relation Age of Onset  . Thyroid disease Mother     hypothyroidism  . Migraines Mother   . COPD Neg Hx     No FH of Colon Cancer    BP 104/78  Pulse 78  Temp(Src) 98 F (36.7 C) (Oral)  Ht 5' 6.5" (1.689 m)  Wt 159 lb 1.9 oz (72.176 kg)  BMI 25.30 kg/m2  SpO2 99%   Review of Systems Denies sob    Objective:   Physical Exam GENERAL: no distress Skin:  Moderate eczematous rash at the writs, ankles, and face.         Assessment & Plan:  Rash, uncertain etiology.  New problem

## 2010-09-07 NOTE — Patient Instructions (Addendum)
You continue to use the benadryl cram as needed. i have sent to your pharmacy prescriptions for steroid pills, and cream. I hope feel better soon.  If you don't feel better by next week, please call your doctor.

## 2010-09-07 NOTE — Telephone Encounter (Signed)
Refilled medication

## 2010-09-09 DIAGNOSIS — R21 Rash and other nonspecific skin eruption: Secondary | ICD-10-CM | POA: Insufficient documentation

## 2010-09-12 NOTE — Assessment & Plan Note (Signed)
[facility]                            CARDIOLOGY OFFICE NOTE   NAME:YONCETaimane, Stimmel                          MRN:          [id number]  DATE:05/29/2007                            DOB:          1976/04/17    IDENTIFICATION:  Ms. Chelsey Sanford is a woman I follow in cardiology clinic.  She is 35 year old with by autonomic dysfunction.  I last saw her in  December.   In the interval, I talked to the patient.  She had been doing a little  bit better this was a little over a week ago.  She was feeling  completely normal but she says the dizziness has improved.   This past weekend, on Friday, she drove with her family (she was a  passenger) to the mountains.  She was outside some watching her daughter  use her inner tube down a hill.  Actually went on this for a bit.  By  Monday she was lying in bed, did not get up.  Tuesday she felt bad, but  got up.  Yesterday was bad.  Today she was eating breakfast and she  began to feel things tunnel down.  She developed cramping diffusely.  She was dizzy.  Her face felt numb at times.  She has stiff neck.  Just  overall is feeling much worse than previous.   CURRENT MEDICATIONS:  Florinef 0.1 mg at 8:00 a.m. in 0.1 mg at 2:00  p.m. pindolol 2.5, Yasmin and salt tablets.   PHYSICAL EXAM:  The patient is in no significant distress at rest.  She  is tearful at times.  Blood pressure laying 117/74, pulse is 86.  She is a little lightheaded  throughout all of orthostatics.  Sitting 123/78 pulse 86, standing zero  minutes 125/80 pulse 98, at 2 minutes 123/86 pulse 101, at 5 minutes,  124/84 pulse 108.  Her lungs are clear.  Cardiac exam regular rate and rhythm, S1-S2 no S3 no murmurs.  No  clicks.  Abdomen is benign.  EXTREMITIES:  No edema.   IMPRESSION:  1. Autonomic dysfunction sounds like she has had a setback possibly      with a recent travel and some over exertion.  Question some      dehydration.  I will give her a  1.5 liters of IV fluid today.  Plan      to check a BMET, CBC and also an intracellular (RBC) magnesium.      She should continue on her medicines.  She is asking about the mag      oxide.  I gave her just over-the-counter supplement but may switch      it to magnesium oxide.  Like to see her blood work first.  I told      her to be in touch with her once I have seen the results of where      to proceed with her medicines.     Pricilla Riffle, MD, [facility]  Electronically Signed    PVR/MedQ  DD: 05/29/2007  DT: 05/30/2007  Job #: [id number]

## 2010-09-12 NOTE — Assessment & Plan Note (Signed)
[facility]                            CARDIOLOGY OFFICE NOTE   NAME:Chelsey Sanford, Chelsey Sanford                          MRN:          [id number]  DATE:08/28/2007                            DOB:          03-18-1976    IDENTIFICATION:  Chelsey Sanford is a 35 year old woman with dysautonomia.  I  last saw her in cardiology clinic back on February 23.   In the interval she has been seen by Dr. Graciela Husbands at [facility].  She  underwent tilt table testing last week and came off her medicines for  this.   Since having the test done she is back on her medicines, which now  include a low dose of Mestinon.  She has been feeling very poorly.  She  has had a lot of diarrhea (see she did report some bright red blood in  her stool).  She has also had dizziness, severe headache (went to the  emergency room but was not officially seen), and she has had nausea and  vomiting.   She called yesterday.  Unfortunately, we did not have space in our  clinic for hydration nor was there availability at the hospital except  through the emergency room.  I asked her if she could wait until today,  she could come in for IV fluids.   On talking to her this morning, she has had a lot of nausea and  vomiting, no fevers.  No diarrhea today.  Blood pressure on arrival 124/85 pulse 65, weight 144.  LUNGS:  Clear.  CARDIAC:  Regular rate and rhythm, S1, S2, no murmurs.  ABDOMEN:  Benign.  EXTREMITIES:  No edema.   CURRENT MEDICINES:  1. Multivitamin.  2. Mestinon question quarter tablet t.i.d.  3. Magnesium oxide 400 daily.  4. Pindolol 2.5 mg b.i.d.  5. Celexa 20 mg.  6. Florinef 0.15 mg b.i.d.  7. Yasmin.   The patient was given 2 L of normal saline.  I would continue on the  current medical regimen.  Otherwise she is due back to see Dr. Graciela Husbands in  the next couple weeks.  If she continues to do poorly before then we can  set her up for further fluid, but I am not eager to change her medicines  since she is being followed by Dr. Graciela Husbands.   Again, no blood work was drawn today.  We will see her response to IV  fluid's help.     Pricilla Riffle, MD, [facility]  Electronically Signed    PVR/MedQ  DD: 08/28/2007  DT: 08/28/2007  Job #: [id number]   cc:   Rickard Patience, MD

## 2010-09-12 NOTE — Assessment & Plan Note (Signed)
[facility]                            CARDIOLOGY OFFICE NOTE   NAME:Chelsey Sanford, Chelsey Sanford                          MRN:          [id number]  DATE:05/30/2007                            DOB:          [birth year]    IDENTIFICATION:  Chelsey Sanford is a woman who I have followed clinic.  She has a  history of dysautonomia.  I saw her yesterday and she was feeling  poorly, refer to full dictation for full details.   She had a BMET, CBC, magnesium done (was goes to be an intracellular  magnesium but mis-done).  Magnesium was 3, potassium was 3.6.   I have recommended the patient increase her Florinef to 0.15 mg in the  a.m. and 0.15 mg in the afternoon.  Also gave her prescription for mag  oxide tablets 400 to take every other day.  She will come in 1 week for  BMET panel.  I will set to see her back in clinic in 3 weeks.  Encouraged her on the phone to increase her fluid and salt intake.     Pricilla Riffle, MD, [facility]  Electronically Signed    PVR/MedQ  DD: 05/30/2007  DT: 05/30/2007  Job #: [id number]

## 2010-09-12 NOTE — Assessment & Plan Note (Signed)
[facility]                            CARDIOLOGY OFFICE NOTE   NAME:YONCEKeighley, Deckman                          MRN:          [id number]  DATE:06/23/2007                            DOB:          07-28-1975    IDENTIFICATION:  Ms. Starkman is a 35 year old woman with a history of  autonomic dysfunction.  She was last seen in cardiology clinic actually  back on January 29.   When I saw her on January 29, she was having a setback. I actually gave  her some IV fluids and checked some electrolytes. I got back in touch  with her and recommended that she increase her Florinef to 0.15 at 8:00  a.m. and 2:00 p.m.  I gave her a prescription for magnesium oxide. She  said she felt better on this.  Encouraged her to continue on with  fluid/salt intake.   I talked to a her few days ago. She was seeing some floaters in one eye  and again feeling poorly. She had her appointment this week, and I  recommended adding Celexa to her regimen.  Again, she has been reluctant  to change medicines and to add medicines given her experience with one  dose of Wellbutrin. She has filled a prescription for Celexa and is now  taking it.   Over the weekend, she has felt some better but still weak. Things get  worse as the day goes on.  She has some cramping in her legs, she still  notes occasional headaches, floaters.   CURRENT MEDICATIONS:  1. Yasmin.  2. Pindolol 2.5 at lunch.  3. Salt tablet 1/2 daily.  4. Florinef 0.15 mg b.i.d. (8 a.m. and 2 p.m.).  5. Celexa 20.  6. Magnesium oxide 400 every other day.   PHYSICAL EXAMINATION:  GENERAL:  The patient is in no distress.  VITAL SIGNS:  Blood pressure is 111/74 lying, pulse 80; sitting 126/71,  pulse 97; standing at 0 minutes 122/83, pulse 100.  At 2 minutes,  133/91, pulse 102, the patient feeling her pulse stronger; at 5 minutes,  133/97 pulse 107.Marland Kitchen  LUNGS:  Clear.  CARDIAC:  Regular rate and rhythm.  Normal S1-S2, no  murmurs.  ABDOMEN:  Benign.  EXTREMITIES:  Some minimal swelling of her fingers. No lower extremity  edema.   IMPRESSION:  Autonomic dysfunction, still with evidence of peripheral  pooling.  Blood pressure actually increases with standing with signs of  increased sympathetic response.   I would back off on the salt. She can pull back on the afternoon  Florinef to 0.1 at 2:00 p.m.; keep the a.m. at 0.15.  Continue on the  Celexa.  Re-add an evening dose pindolol 2.5.   She is planning to go to [place]. Her husband has won a trip through his  company. With her evidence of some increased fluid on exam, I would not  plan any other hydration. I told she may have some setback with the  changes of elevation, but she is eager to take this trip.  I told her  again to watch which  she eats and rest.  They are staying at a resort.  She will check on the food and water situation there.   Her appointment with Dr. Graciela Husbands is in March.  I have asked her to keep  this. I will see her back after.     Pricilla Riffle, MD, [facility]  Electronically Signed    PVR/MedQ  DD: 06/24/2007  DT: 06/24/2007  Job #: [id number]

## 2010-09-12 NOTE — Assessment & Plan Note (Signed)
[facility]                            CARDIOLOGY OFFICE NOTE   NAME:Chelsey Sanford                          MRN:          [id number]  DATE:02/03/2007                            DOB:          October 13, 1975    IDENTIFICATION:  Chelsey Sanford is a 35 year old woman with a history of  dysautonomia.  I last saw her in Cardiology actually on September 12.   In the interval I have spoken to the patient a couple of times, she  actually came in for a blood pressure check on September 16 with still  evidence of orthostasis feeling poorly, she was given IV fluids normal  saline.  The patient was also seen in GI inbetween my seeing her, they  did not feel there was an active GI process.   I have spoken to [facility] on the phone, since seen she has been placed on  Florinef 0.05 b.i.d.  This was increased about a week ago to 0.1 in the  a.m., 0.05 in the p.m.   Since seeing and now on the higher dose of Florinef she is doing some  better.  Her headaches are gone, she still gets an achy neck at  nighttime.  She is not as dizzy, not nearly feeling like she is going to  faint.  Her GI tract is not normal but not what it was with the  diarrhea.  Her appetite is okay and she is trying to drink fluids.   Still, she does not feel quite right, her energy is off.  She is very  fatigued.  If she goes to the store she will feel her heart racing and  she will be tired.   CURRENT MEDICATIONS:  1. Florinef 0.1 in the a.m., 0.05 q.p.m.  2. Advil p.r.n.  3. Yasmin birth control.  4. Rhinocort p.r.n.  5. Allegra-D.   PHYSICAL EXAMINATION:  Patient is in no distress.  Blood pressure laying  109/75, pulse 88; sitting 132/89, pulse at 91; standing at 0 minutes  140/91, pulse 93; at 2 minutes 123/85, pulse 100; at 5 minutes 133/86,  pulse 100.  LUNGS:  Clear.  NECK:  JVP is normal.  CARDIAC:  Regular rate and rhythm, S1-S2, no S3.  ABDOMEN:  Benign.  EXTREMITIES:  No edema.   IMPRESSION:   Autonomic dysfunction, improved from her last visit and  even her last nurse's visit.  She does not meet full criteria for  orthostasis, has a little bit of a hypertensive response initially with  standing.  Still, her symptoms have not completely resolved.  Again  though I have encouraged her I think, this has been a fairly quick  response in the positive direction.   I would try adding Wellbutrin 150 to her regimen.  Continue on the  Florinef dressing as it is.  I will check a BMET today.  I have  encouraged her to stay active, again exercising.  Again, she can try  neck collar to ease the load on her neck at night.  Abdominal binders,  small meals.   I am not eager  to have her switch her oral contraceptive as I think  right now it might add a little confusion to what we have, I do not want  to do this.   I will set followup for 1 month, sooner if problems develop.     Pricilla Riffle, MD, [facility]  Electronically Signed    PVR/MedQ  DD: 02/03/2007  DT: 02/04/2007  Job #: [id number]

## 2010-09-12 NOTE — Assessment & Plan Note (Signed)
[facility]                            CARDIOLOGY OFFICE NOTE   NAME:Chelsey Sanford, Laidlaw                          MRN:          [id number]  DATE:01/14/2007                            DOB:          01-02-1976    IDENTIFICATION:  This is a record of a phone conversation with Ms.  Swart.  Her mother called me today saying the patient continued to feel  poorly.  She was very dizzy this morning.   I spoke to the patient on the phone.  She began taking the Flagyl on  Friday after I saw her, continuing to drink fluids, increased salt  intake.  She said past couple days has not had watery stool.  Headache  yesterday she treated with ibuprofen, was able to sleep last night.   This morning she woke up initially feeling pretty good but then became  progressively more dizzy, jittery.  She has had 3 episodes of diarrhea,  she thinks it is worse.  She feels shaky.  She was squatting to clean  the [employer], when she stood she felt very, very faint, did not pass  out but sat on the sofa.  Overall does not feel well.   What I have recommended is the patient come to our office.  We will go  ahead and recheck a CBC, BMET, urinalysis, cortisol, TSH, beta hCG.  I  will have the patient set up to receive (after orthostatics) 1L of  normal saline intravenously.     Pricilla Riffle, MD, [facility]  Electronically Signed    PVR/MedQ  DD: 01/14/2007  DT: 01/14/2007  Job #: [id number]

## 2010-09-12 NOTE — Assessment & Plan Note (Signed)
HEALTHCARE                         GASTROENTEROLOGY OFFICE NOTE   NAME:Chelsey, Sanford                          MRN:          [id number]  DATE:01/15/2007                            DOB:          1976/01/07    REFERRING PHYSICIAN:  Pricilla Riffle, MD, [facility]   PRIMARY CARE PHYSICIAN:  Dr. Danise Edge with [facility].   PROBLEM:  Nausea, dizziness, and intermittent loose stools.   HISTORY:  Chelsey Sanford is a very nice, generally healthy 35 year old white  female who is accompanied by her husband today.  She says her initial  symptoms started abruptly about 4 weeks ago while she was driving home  in the car with her husband from a funeral.  She said she developed a  sensation of tunnel vision, and then felt as if she was going to pass  out.  This was followed by nausea and vomiting, diaphoresis, weakness,  and one episode of diarrhea.  The dizziness and nausea persisted, and  she went to the emergency room.  She had workup there, including CT of  her head, which was done without contrast, and was told that she  probably had labyrinthitis.  She then followed up with her primary  physician within the next few days, who also felt that she likely had a  virus, and she was given some Phenergan for the nausea and vomiting.  She says this made her very sleepy, and that she slept at home for 2  days.  After that time, she was noted to be orthostatic, and was  referred to Dr. Tenny Craw.  She says ever since that time she has had  intermittent dizziness throughout the day, which scares her, but  occasionally it will occur while she is driving.  She says she feels  somewhat visually disoriented with these episodes, seems to be worse  with bending over, sometimes with standing.  Usually with the dizziness,  she will develop nausea, but generally does not vomit.  She also  complains of headaches over the past 4 weeks, which have been terrible.  She says she did have a few  migraines in college, but this feels  somewhat different, it is a posterior headache that comes forward  bilaterally to the front.  She says she had 3 solid says of headache  over this past weekend.  She has not had any documented fever or chills.  She does have an appetite, which is fair, and has been trying to eat  well and increase her sodium.  She has had some urgency for bowel  movements, but her bowel movements have generally been fairly regular,  occasionally loose, and somewhat foul smelling.  After being evaluated  by Dr. Tenny Craw, it was not sure whether she had a component of an  infectious gastroenteritis, and she was started empirically on Flagyl,  which she has been taking, but says makes her more nauseated.   The day prior to this office visit, she had a bad episode of dizziness,  and says that she has felt sort of fuzzy ever since then.   CURRENT  MEDICATIONS:  1. Prenatal vitamins daily.  2. Allegra D daily.  3. YAZ birth control pills daily.  4. Flagyl 250 q.i.d. over the past 5 days.   ALLERGIES:  1. PENICILLIN.  2. SULFA.  3. ERYTHROMYCIN.  4. AMOXICILLIN.   PAST MEDICAL HISTORY:  Benign.  She has had some chronic allergy  difficulty, seasonal allergies.  No prior surgeries.   FAMILY HISTORY:  Pertinent for heart disease in her maternal grandmother  and great grandmother.  No family history of GI disease.   SOCIAL HISTORY:  Patient is married, has 1 small child.  She is a full  time mom.  She is a nonsmoker.  Drinks alcohol socially.   REVIEW OF SYSTEMS:  Pertinent for allergy sinus symptoms.  She does have  some dysmenorrhea, and low back pain.  Her last menstrual period was  December 22, 2006.  GI symptoms as above.  Neurologic symptoms as above.  All other review of systems negative.   PHYSICAL EXAMINATION:  Well-developed white female in no acute distress.  Height is 5 feet 6 inches.  Weight is 142.  Blood pressure 120/80.  Pulse is in the 116 to 120 range  and regular.  HEENT:  Atraumatic.  Normocephalic.  EOMI.  PERRLA.  Sclerae are  anicteric.  NECK:  Supple without nodes.  CARDIOVASCULAR:  Regular rate and rhythm with S1 and S2.  No murmur,  rub, or gallop.  PULMONARY:  Clear to auscultation and percussion.  ABDOMEN:  Soft.  She is basically nontender.  There is no palpable mass  or hepatosplenomegaly.  RECTAL EXAM:  Was not done today.  EXTREMITIES:  Without clubbing, cyanosis, or edema.  NEUROLOGIC:  Grossly nonfocal.   LABORATORY STUDIES:  Drawn per Dr. Tenny Craw with WBC of 9, hemoglobin 14.6,  hematocrit of 41.3, MCV of 83, platelets 226,000, electrolytes within  normal limits, BUN 7, creatinine 0.7, liver function studies normal,  amylase 73, TSH of 2.85, beta HCG negative.  The cortisol level was  20.6, this was an a.m. level, and was normal.   IMPRESSION:  A 35 year old white female with 4-week history of a  syndrome with dizziness, nausea, orthostasis, tachycardia, intermittent  urgency and loose stools of unclear etiology.  I do not think she has a  primary gastrointestinal issue.  There is some concern for a  dysautonomia-type picture, which may have been triggered by a  gastroenteritis.   PLAN:  Patient was encouraged to complete a 1-week course of the Flagyl,  and then discontinue.  I have offered her an antispasmodic, which she is  declining at this point because she does not want to take anything else  that may contribute to dizziness, etc.  I have reassured her that we do  not think she has any underlying significant GI pathology at present,  and that her nausea is likely secondary to her orthostatic symptoms or  some other non GI problem. As such, she will return to the care of Dr.  Tenny Craw, who I had spoken with earlier in the day, who indicated  that she will likely start the patient on Florinef, or a similar  medication to help with regulation of her blood pressure. No further GI  plans at this time (follow up  prn).      Mike Gip, PA-C  Electronically Signed      Wilhemina Bonito. Marina Goodell, MD  Electronically Signed   AE/MedQ  DD: 01/17/2007  DT: 01/18/2007  Job #: [id number]   cc:  Pricilla Riffle, MD, [facility]

## 2010-09-12 NOTE — Assessment & Plan Note (Signed)
[facility]                            CARDIOLOGY OFFICE NOTE   NAME:YONCECiarah, Sanford                          MRN:          [id number]  DATE:04/07/2007                            DOB:          12/22/1975    IDENTIFICATION:  Chelsey Sanford is a woman I follow in clinic.  She is 35 years  old with autonomic dysfunction.  I last saw her on November 21.  She is  currently on Florinef 0.1/0.05.  Note this was recently increased a few  days ago to 0.1 b.i.d. by Maurine Cane.  Patient called, complaining of  dizziness.  She is also on 2.5 mg of pindolol daily.  She did not  tolerate Wellbutrin.   The patient comes in today, tearful, crying.  She still is feeling bad,  feeling foggy in her head, some days with severe dizziness.  She is  afraid to go out, still having severe headaches at times, more starting  posterior and radiating forward.  She will have some partial days that  are okay.  She is just very frustrated.   CURRENT MEDICATIONS:  1. Allegra D.  2. Yasmin.  3. Florinef 0.1 a.m., 0.1 at 2 p.m.  4. Pindolol 2.5.  5. Salt tablets.   PHYSICAL EXAM:  ORTHOSTATICS:  Blood pressure lying is 122/87, pulse 72.  Sitting, 138/91, pulse 80.  Standing 0 minutes, 126/80, pulse 82.  At  two minutes, 131/84, pulse 83.  At five minutes, 130/84, pulse 80.  Patient with disconnected feeling and light-headed throughout the whole  orthostatic testing.  LUNGS:  Clear.  CARDIAC EXAM:  Regular rate and rhythm, no murmurs or clicks.  ABDOMEN:  Benign.  EXTREMITIES:  Feet cool, no edema.   IMPRESSION:  Dysautonomia.  Clinically, her blood pressure has improved,  but she is still symptomatic.  She is very sensitive to medicines.  I  spent a long time talking to her.  I think overall she has improved  some, but is long-course with this.  I am reluctant to add anything  before talking to Drs. Ladona Ridgel and Graciela Husbands to get their opinion.   With her severe headaches, I will set her up  for an MRI/MRA of her head,  especially with a family history of aneurysms, though I am not convinced  this will yield anything.   The patient also asked about other options for second opinions.  We  discussed Georgana Curio.  I am not sure if he is taking new patients or  not, but I told her I would look into this and get back  in touch with her.  For now, continue on Florinef 0.1 at 8 and 2 p.m.,  continue on the pindolol and fluids and I, again, will be in touch with  her.     Pricilla Riffle, MD, [facility]  Electronically Signed    PVR/MedQ  DD: 04/09/2007  DT: 04/09/2007  Job #: [id number]

## 2010-09-12 NOTE — Assessment & Plan Note (Signed)
[facility]                            CARDIOLOGY OFFICE NOTE   NAME:YONCEDerotha, Fishbaugh                          MRN:          [id number]  DATE:05/17/2008                            DOB:          01-12-1976    IDENTIFICATION:  Ms. Outten is a 35 year old woman with a history of  autonomic dysfunction with history of dysautonomia.  She is also  followed at [facility] in the GI Clinic and in the Neurology by Dr.  Graciela Husbands.   I saw Ms. Drab back in August.   In the interval, she has been doing okay.  She seems to be coping more  she says with her ups and downs.  She is getting IV fluids once a week,  she tried twice a week, but had some problems with IV access at that  time.  She usually takes 2 liters on Sunday.   Her biggest complaints have been GI and she is again following up with  [facility] GI division.  She said there is some consideration for having an  upper GI in the near future.  Note, she was seen at [facility] in both  Neurology and GI back in December.  She has an appointment with Dr.  Graciela Husbands tomorrow.   She still gets headaches posteriorly, though not as bad.  Again, the GI  symptoms have been prominent.  Note, prior to Christmas, she had the flu  for a couple of days, but relieved and had upper respiratory complaints  did not seem to have any exacerbation of her autonomic issues.   CURRENT MEDICATIONS:  1. Yasmin.  2. Florinef 0.15 b.i.d.  3. Celexa 20.  4. Pindolol 5 b.i.d.  5. Mestinon 60 t.i.d.  6. Mag oxide 400 daily.  7. Vear Clock' nightly.  8. MiraLax.  9. Probiotic.  10.IV fluids one time per week.   PHYSICAL EXAMINATION:  GENERAL:  The patient is in no distress at rest.  VITAL SIGNS:  Blood pressure lying 106/67, pulse 76, sitting slight  lightheadedness and when she first sat up blood pressure 119/81, pulse  84, standing at 0 minute 116/71, pulse 89, at 2 minutes 112/67 pulse 85.  The patient slightly diaphoretic at 5 minutes.  The  patient felt her  legs burning and became more diaphoretic, blood pressure 114/48, pulse  99.  LUNGS:  Clear.  CARDIAC:  Regular rate and rhythm.  S1, S2.  No S3, no murmurs.  ABDOMEN:  Benign, nontender.  Normal bowel sounds.  EXTREMITIES:  Feet and hands warm.  No edema.   IMPRESSION:  Autonomic dysfunction still with evidence of relative  tachycardia with standing.  Also note that, she does have a diastolic  blood pressure drop of over 10 points at 5-minute standing.  Became  quite symptomatic.  I am not going to make any changes.  She is due to  see Dr. Graciela Husbands tomorrow.  There had been some mention of possibly  ProAmatine for her way to see what is that.   I encouraged her to continue as she is doing on support hose and  abdominal binders as she tolerates.  Again, continue with the fluids.  Eager to hear more from GI division regarding her workup.   I will set followup for this summer, but again wait to hear from  response from [facility].     Pricilla Riffle, MD, [facility]  Electronically Signed    PVR/MedQ  DD: 05/17/2008  DT: 05/18/2008  Job #: [id number]   cc:   Laurey Arrow, PA  Rickard Patience, MD  Lula Olszewski, MD @ [facility]

## 2010-09-12 NOTE — Assessment & Plan Note (Signed)
[facility] [facility]                            CARDIOLOGY OFFICE NOTE   NAME:[name], Chelsey Sanford                          MRN:          [id number]  DATE:01/15/2007                            DOB:          Sep 13, 1975    IDENTIFICATION:  Chelsey Sanford is a 35 year old woman whom I have followed  in clinic for the past week.  She has a history of dizziness, near  syncope, and palpitations.  See initial dictation for full details.   I spoke to the patient tonight on the phone.  She was seen in GI today.  I actually spoke with Mike Gip, and they did not think there was an  active GI process.  They told the patient to continue her Flagyl for 7  days.   I spoke to the patient this evening.  She has not had a bowel movement.  She is still dizzy some, but not as bad.  She is trying to drink fluids.   I told the patient to continue on her regimen.  If the symptoms do not  start to resolve by the end of the week, I would begin Florinef.  Again,  all of her blood work and studies have been negative so far.  She has my  beeper number.  Should her symptoms worsen, I would be happy to talk to  her and change plans, but we will go with this for now.     Pricilla Riffle, MD, [facility]  Electronically Signed    PVR/MedQ  DD: 01/16/2007  DT: 01/16/2007  Job #: [id number]

## 2010-09-12 NOTE — Assessment & Plan Note (Signed)
[place] [facility]                            CARDIOLOGY OFFICE NOTE   NAME:YONCEPatrick, Sohm                          MRN:          [id number]  DATE:12/26/2007                            DOB:          [birth year]    IDENTIFICATION:  Ms. Whitaker is a 35 year old woman with dysautonomia.  She was last seen in April.   In the interval, she has been in once and actually seen by Wende Bushy on December 17, 2007.  She was complaining of increased symptoms of  chest pain, like a lump in her chest with sharp shooting pains.  At that  time, she was encouraged to take an extra half of pindolol to see if it  helped with palpitations.  Continue fluids.   The patient does get IV hydration one time per week, though she did miss  this Sunday.   I got a call from her mother and then the patient.  On Wednesday, she  was having a very bad day.  She was waiting to hear back from the  visiting nurse, and they did in deed come back for IV fluids that  evening.   Since the fluids, the patient has been feeling better.  She understands  this is a chronic problem and that it is going to take time with up and  down days.   On days after IV hydration, her symptoms seem to be tolerable.  She gets  nauseated.  She does get posterior headaches.  She has dizziness, but  she states her symptoms are controllable at times; but particularly when  she misses fluids, she has a very difficult time.  She feels some  significant palpitations like her heart is going to beat out of her  chest and significant chest pressure.   CURRENT MEDICINES:  1. Yasmin.  2. Florinef 0.15 b.i.d.  3. Celexa 20.  4. Pindolol 5 b.i.d.  5. Pyridostigmine 60 t.i.d.  6. Magnesium oxide 400.  7. Phillips p.r.n.  8. MiraLax p.r.n.  9. Probiotic.   PHYSICAL EXAMINATION:  GENERAL:  The patient is in no acute distress at  rest.  VITAL SIGNS:  Blood pressure 119/79, pulse 68, lying, a little dizzy;  sitting  134/83, pulse 78, a little dizzy; standing at 0 minutes 131/84,  pulse 81, little dizzy; 2 minutes 115/82, pulse 82, a little dizzy; 5  minutes 117/82, pulse 81, a little dizzy.  NECK:  JVP is normal.  LUNGS:  Clear.  No rales.  CARDIAC:  Regular rate and rhythm.  S1 and S2.  No murmurs.  ABDOMEN:  Benign.  EXTREMITIES:  No edema.   IMPRESSION:  Dysautonomia, on several medicines.  She was doing better  earlier this year, but when she went off the medications for a tilt  table, she has had a regression.  She is followed by Dr. Graciela Husbands in [facility] and also by one of the PA's in the Gastrointestinal Division.  I  will try to get these records from the most recent clinic visit.   I encouraged the patient to continue on  with IV fluids.  We are not  close to tapering now.  She seems to understand this.  I will see if we  can get GI followup locally.  There is a concern of hemorrhoids.   I will set to see the patient back later in the fall and again be in  touch with her and her family regarding records from [facility] and her  progress.     Pricilla Riffle, MD, [facility]  Electronically Signed    PVR/MedQ  DD: 12/29/2007  DT: 12/29/2007  Job #: [id number]

## 2010-09-12 NOTE — Assessment & Plan Note (Signed)
[facility] [facility]                            CARDIOLOGY OFFICE NOTE   NAME:[name], [name]                          MRN:          [id number]  DATE:03/21/2007                            DOB:          03-20-1976    IDENTIFICATION:  Chelsey Sanford is a 35 year old with a history of autonomic  dysfunction.  I last saw her back in October.   In the interval, she has actually improved some.  She said she has  actually had a couple of weeks where she felt pretty good, almost back  to her old self.  Then a few days ago she developed a bad headache, one  of her worst headaches ever, posterior, spreading across to the front of  her head.  It lasted a couple of days.  No other neurologic complaints.  Today, the headache is gone.  She is feeling somewhat better.   CURRENT MEDICATIONS:  1. Allegra-D.  2. Rhinocort p.r.n.  3. Yasmin.  4. Florinef 0.1 mg q.a.m. and 0.5 mg q.p.m.  5. Pindolol 2.5 mg at lunch.  6. Salt tablets.   PHYSICAL EXAMINATION:  GENERAL:  The patient is in no distress.  VITAL SIGNS:  Blood pressure laying 101/69.  She is mildly lightheaded.  Pulse 74.  Sitting:  128/84, pulse 80.  Standing:  Zero minutes 125/86,  pulse 82; standing two minutes 136/90, pulse 92; standing five minutes  120/83, pulse 84.  The patient felt okay with this.  LUNGS:  Clear.  CARDIAC:  Regular rate and rhythm.  S1 S2.  No S3.  No murmurs.  ABDOMEN:  Benign.  EXTREMITIES:  No edema.   IMPRESSION:  1. Autonomic dysfunction, improving actually both clinically and by      blood pressure.  I would continue on current regimen.  I am      reluctant to change anything for now.  I encouraged her to take      activities as tolerated and stay active.  We will check a B-MET      today.  2. Headaches.  Again may be an exacerbation of her dysautonomia.      Again, if she has a bad headache in the future, I told her to call      me.  3. Gastroenterology.  The patient's appetite is down.   She is      constipated at times and has a bowel movement every few days.  I      will discuss with Dr. Lina Sar any possible options to improve      regularity and appetite.  Again, may reflect autonomic problems.   FOLLOWUP:  In 6-8 weeks.     Pricilla Riffle, MD, [facility]  Electronically Signed    PVR/MedQ  DD: 03/23/2007  DT: 03/23/2007  Job #: [id number]

## 2010-09-12 NOTE — Assessment & Plan Note (Signed)
[place] [facility]                            CARDIOLOGY OFFICE NOTE   NAME:YONCECarola, Viramontes                          MRN:          [id number]  DATE:01/10/2007                            DOB:          [birth year]    IDENTIFICATION:  Mrs. Penny is a 35 year old who comes in today on  referral from [facility] for evaluation of dizziness.   HISTORY OF PRESENT ILLNESS:  The patient dates the episodes as beginning  discreetly in mid August.  She was driving home with her husband from a  funeral.  She had been feeling fine that day, did not have anything to  eat at the funeral.  She was in the car nearing [place] when all of a  sudden she began to develop some tunnel vision.  She did not black out,  but she felt like she needed to get a drink of water.  They were so  close to home that they kept driving.   In getting to home things got worse, and again profound dizziness, no  syncope, nausea, vomiting, there was 1 loose bowel movement.   The patient was taken to the emergency room by her family, she was told  she was dehydrated, initial review of blood work was relatively  unremarkable.  She also had urinalysis done which revealed a specific  gravity of 1.016.  In addition, head CT was negative.  Chest x-ray  negative.   The patient was told to go home, drink plenty of fluids, that she may  have a viral infection.   Over the next few weeks patient has continued to have problems, she  again denies syncope but just has spells of feeling a little bit out of  it.  She will feel somewhat dizzy, weak, a little bit distant from  things, she has numbness in her hands.  Changing position from bending  to standing is difficult.   The patient does note 1 loose bowel movement per day which is  malodorous, this is new for her, and with significant urgency.   Patient has continued to have some nausea, appetite decreased, for  breakfast she may have a coffee or  banana, lunch some soup, dinner she  may have a meat dish.  Have a gingerale.  Overall, she says she is  drinking adequately, by the end of the day her urine is more clear.   She denies fevers, chills.  Just feeling swimmy-headed.   ALLERGIES:  1. PENICILLIN.  2. SULFA.  3. ERYTHROMYCIN.  4. AMOXICILLIN.   MEDICATIONS:  Prenatal vitamin, Allegra D, Rhinocort, Yasmin and Advil.   PAST MEDICAL HISTORY:  Negative.   PAST SURGICAL HISTORY:  1. Ankle reconstruction.  2. Wisdom teeth.  3. Tonsillectomy.   FAMILY HISTORY:  1. Negative for premature CAD.  2. Positive for hypertension.   SOCIAL HISTORY:  1. The patient is a full-time homemaker.  2. Quit tobacco in 2004.  3. Drinks occasionally 1 to 2 drinks.  4. Drinks 1 to 2 cups of coffee per day.   Otherwise, all systems reviewed negative to  above problem except as  noted.   PHYSICAL EXAM:  On exam, the patient is in no distress at rest.  Blood  pressure laying 122/83, pulse is 93; sitting 148/88, pulse is 90;  standing at 0 minutes 119/87, pulse 110, patient nervous; at 2 minutes  120/76, pulse 119; at 5 minutes 117/76, pulse 122.  HEENT:  Normocephalic, atraumatic, EOMI, PERRL.  Throat clear.  Mucus  membranes moist.  NECK:  JVP is normal, no thyromegaly.  No bruits, no lymphadenopathy.  LUNGS:  Clear without rales or wheezes.  CARDIAC EXAM:  Regular rate and rhythm, S1 S2, no S3.  No murmurs or  clicks.  ABDOMEN:  Supple, nontender, no hepatomegaly.  There is some increased  bowel sounds.  EXTREMITIES:  Good distal pulses throughout, no lower extremity edema.  RECTAL EXAM:  Shows heme negative stool.  A 12-lead EKG shows normal  sinus rhythm 99 bpm.   IMPRESSION:  Patient is a 35 year old who was previously healthy until  mid August.  She now has evidence of autonomic dysfunction with POTS.  She does have change in her bowel habits and I wonder, if indeed, she  has had a gastrointestinal infection that has  exacerbated this.  Again,  she noted a change to loose stool with significant urgency and change in  smell, it is heme negative.   She had blood work done recently at [facility]  (yesterday) which was by report unremarkable.   What I would recommend is I have provided reassurance.  I think this is  temporary and not life-threatening.  The patient was very anxious given  that she stays at home alone with her daughter as her husband travels  quite a bit.   I talked to GI.  Would give her a prescription for Flagyl 250 every 6  hours along with some Compazine.  I told her to begin eating some Activa  type yogurt.   She should continue to push fluids, increase her salt intake by salting  things, can consider an over-the-counter tablet 1 to 2 per day.   The patient will call later in the week to let me know how she is doing  and if her symptoms are unchanged or worsened.     Pricilla Riffle, MD, [facility]  Electronically Signed    PVR/MedQ  DD: 01/11/2007  DT: 01/12/2007  Job #: [id number]   cc:   [facility] at [facility]

## 2010-09-12 NOTE — Assessment & Plan Note (Signed)
[place] [facility]                            CARDIOLOGY OFFICE NOTE   NAME:YONCECoralie, Stanke                          MRN:          [id number]  DATE:01/23/2007                            DOB:          [birth year]    PATIENT IDENTIFICATION:  The patient is a 35 year old woman with  autonomic dysfunction.  I spoke to her on the telephone today.  She is  on Florinef 0.05 b.i.d.  Has noted some improvement in her symptoms,  slight, not much.  Still continues headaches.  Dizziness is not as  profound.   I encouraged the patient to stay on the Florinef.  I actually told her  to increase it to 0.1 and 0.05 a day.  I will set to see her back on  October 6.  Encouraged her to increase her salt intake.  Let her know  her potassium was normal at 3.6.     Pricilla Riffle, MD, [facility]  Electronically Signed    PVR/MedQ  DD: 01/23/2007  DT: 01/24/2007  Job #: [id number]

## 2010-09-12 NOTE — Assessment & Plan Note (Signed)
[facility] [facility]                            CARDIOLOGY OFFICE NOTE   NAME:Chelsey Sanford, Chelsey Sanford                          MRN:          [id number]  DATE:12/17/2007                            DOB:          04/29/1976    PRIMARY CARDIOLOGIST:  Pricilla Riffle, MD, [facility]   This is a 35 year old married white female patient with history of  autonomic dysfunction that has multiple issues going on.  She had been  followed in [facility] by Dr. Rickard Patience as well.  The patient  comes in today because of increased symptoms of chest pain.  She feels  like there is a lump stuck in her chest as well as sharp shooting pains  that happen on different occasions.  She has multiple episodes where her  heart is just racing, that can awaken her from sleep and her pulse is  120.  Another big complaint is profuse sweating.  She says she breaks  out in a cold sweat, not like a hot flash but just dripping when she is  inside.  She is trying to avoid the heat.  Overall, her symptoms have  improved with weekly hydration at home, which she receives 2 liters of  fluid every Sunday, but over the past 2 weeks, she has definitely had an  increase in symptoms.  She states she is being followed in [facility]  and also by a GI doctor and was told she has some bacteria and was  placed on antibiotic.  They also wanted to do an ultrasound because of  some right upper quadrant pain to rule out gallstones.   CURRENT MEDICATIONS:  1. Yasmin birth control.  2. Florinef 0.1 mg 1-1/2 b.i.d.  3. Celexa 20 mg daily.  4. Pindolol 5 mg b.i.d.  5. Pyridostigmine 60 mg t.i.d.  6. Xifaxan for 10 days.  7. Magnesium oxide 400 mg daily.   PHYSICAL EXAMINATION:  GENERAL:  This is an anxious 35 year old white  female, in no acute distress.  VITAL SIGNS:  Blood pressure 132/81, pulse 93, and weight 148.  NECK:  Without JVD, HJR, bruit, or thyroid enlargement.  LUNGS:  Clear anterior, posterior, and lateral.  HEART:  Regular rate and rhythm at 90 beats per minute.  Normal S1 and  S2.  No murmur, rub, bruit, thrill, or heave noted.  ABDOMEN:  Soft without organomegaly, masses, lesions, or abnormal  tenderness.  EXTREMITIES:  Without cyanosis, clubbing, or edema.  She has good distal  pulses.   IMPRESSION:  Dysautonomia and postural orthostatic tachycardia syndrome.  Gastrointestinal workup currently being done.   PLAN:  I told the patient she could take an extra half pindolol to see  if this would help her symptoms of tachycardia.  She already has an  appointment to see Dr. Tenny Craw next week, which  we will keep.  Her EKG is normal today, and I will make no further  medication changes.  She would like Dr. Tenny Craw to be her primary.  She  will follow up with Dr. Tenny Craw next week.  Jacolyn Reedy, PA-C  Electronically Signed      Everardo Beals. Juanda Chance, MD, [facility]  Electronically Signed   ML/MedQ  DD: 12/17/2007  DT: 12/18/2007  Job #: [id number]

## 2010-09-15 NOTE — Discharge Summary (Signed)
Chelsey Sanford, Chelsey Sanford NO.:  [id number]   MEDICAL RECORD NO.:  [id number]          PATIENT TYPE:  INP   LOCATION:  9136                          FACILITY:  [facility]   PHYSICIAN:  Zenaida Niece, M.D.DATE OF BIRTH:  Feb 06, 1976   DATE OF ADMISSION:  06/20/2004  DATE OF DISCHARGE:                                 DISCHARGE SUMMARY   ADMISSION DIAGNOSES:  1.  Intrauterine pregnancy at 38 weeks.  2.  Pregnancy-induced hypertension.   DISCHARGE DIAGNOSES:  1.  Intrauterine pregnancy at 38 weeks.  2.  Pregnancy-induced hypertension.   PROCEDURES:  On June 21, 2004 she had a spontaneous vaginal delivery.   HISTORY AND PHYSICAL:  This is a 35 year old white female gravida 1 para 0  with an EGA of 38+ weeks who presents for induction due to pregnancy-induced  hypertension. She has had elevated blood pressures since approximately 34  weeks. On the day of admission she presented with the complaint of a severe  headache for 3 days and no relief with rest and Tylenol. Prenatal care is  otherwise uncomplicated. Prenatal labs:  Blood type A positive with a  negative antibody screen, RPR nonreactive, rubella equivocal, hepatitis B  surface antigen negative, HIV negative, gonorrhea and chlamydia negative,  group B strep is negative, 1-hour Glucola is 89. Past medical history:  Anemia and migraine headaches. Surgical history:  Ankle surgery and  tonsillectomy. Allergies to PENICILLIN, ERYTHROMYCIN, and SULFA. Physical  exam:  She is afebrile with blood pressures 130s to 150s over 70s to 90s.  Fetal heart tracing is reactive with irregular contractions. Abdomen is soft  and nontender. Cervix is 1, 50, -2, vertex presentation, and amniotomy  reveals clear fluid.   HOSPITAL COURSE:  The patient was evaluated in triage and admitted for  induction as mentioned above. PIH labs were normal and she had no  significant proteinuria. She had amniotomy performed and was started on  Pitocin. She gradually entered active labor, reached complete, and pushed  well. On the morning of February 22 she had a vaginal delivery of a viable  female infant with Apgars of 8 and 9 that weighed 8 pounds 10 ounces.  Placenta delivered spontaneous, was intact. She had a second degree  laceration repaired with 2-0 Vicryl. Cervix and rectum were intact. She had  a small periurethral laceration, also repaired with 3-0 Vicryl for  hemostasis, and estimated blood loss was 400 mL. Postpartum, she had no  significant complications and blood pressure remained stable. She did  develop a rash around her epidural site where the tape was. On postpartum  day #2 she was felt to be stable enough for discharge home.   DISCHARGE INSTRUCTIONS:  Regular diet, pelvic rest, follow up in 6 weeks.  Medications are Percocet #30 one to two p.o. q.4-6h. p.r.n. pain and over-  the-counter ibuprofen as needed. She was given our discharge pamphlet.      TDM/MEDQ  D:  06/23/2004  T:  06/23/2004  Job:  [id number]

## 2010-10-02 ENCOUNTER — Other Ambulatory Visit: Payer: Self-pay | Admitting: Internal Medicine

## 2010-10-31 ENCOUNTER — Ambulatory Visit (INDEPENDENT_AMBULATORY_CARE_PROVIDER_SITE_OTHER): Payer: BC Managed Care – PPO | Admitting: Internal Medicine

## 2010-10-31 ENCOUNTER — Encounter: Payer: Self-pay | Admitting: Internal Medicine

## 2010-10-31 VITALS — BP 100/72 | HR 70 | Temp 98.5°F | Ht 66.0 in

## 2010-10-31 DIAGNOSIS — R05 Cough: Secondary | ICD-10-CM

## 2010-10-31 DIAGNOSIS — J309 Allergic rhinitis, unspecified: Secondary | ICD-10-CM

## 2010-10-31 DIAGNOSIS — J329 Chronic sinusitis, unspecified: Secondary | ICD-10-CM

## 2010-10-31 MED ORDER — FLUTICASONE FUROATE 27.5 MCG/SPRAY NA SUSP: 1.0000 | Freq: Every day | NASAL | Status: DC

## 2010-10-31 MED ORDER — AZITHROMYCIN 250 MG PO TABS: ORAL_TABLET | ORAL | Status: AC

## 2010-10-31 MED ORDER — HYDROCODONE-HOMATROPINE 5-1.5 MG/5ML PO SYRP: 5.0000 mL | ORAL_SOLUTION | Freq: Four times a day (QID) | ORAL | Status: AC | PRN

## 2010-10-31 NOTE — Patient Instructions (Signed)
It was good to see you today. Zpak antibiotics, new steroid nose spray and cough suppressant - Your prescription(s) have been submitted to your pharmacy. Please take as directed and contact our office if you believe you are having problem(s) with the medication(s).

## 2010-10-31 NOTE — Progress Notes (Signed)
  Subjective:     Chelsey Sanford is a 35 y.o. female who presents for evaluation of sinus pain. Symptoms include: cough, frequent clearing of the throat, headaches, mouth breathing, nasal congestion, sinus pressure and spitting/vomiting mucous. Onset of symptoms was 2 weeks ago. Symptoms have been unchanged since that time. Past history is significant for occasional episodes of bronchitis. Patient is a former smoker, quit 20 years ago.  The following portions of the patient's history were reviewed and updated as appropriate: allergies, current medications, past family history, past medical history, past social history, past surgical history and problem list.  Review of Systems Constitutional: negative for fevers and weight loss Respiratory: negative for asthma and wheezing Cardiovascular: negative for chest pain and palpitations   Objective:    BP 100/72  Pulse 70  Temp(Src) 98.5 F (36.9 C) (Oral)  Ht 5\' 6"  (1.676 m)  SpO2 97% General appearance: alert, cooperative and mild distress Eyes: conjunctivae/corneas clear. PERRL, EOM's intact. Fundi benign. Ears: normal TM's and external ear canals both ears Nose: Nares normal. Septum midline. Mucosa normal. No drainage or sinus tenderness., no discharge, sinus tenderness frontal bilateral Throat: lips, mucosa, and tongue normal; teeth and gums normal Lungs: clear to auscultation bilaterally Heart: regular rate and rhythm, S1, S2 normal, no murmur, click, rub or gallop    Assessment:    Acute allergic sinusitis.    Plan:    Nasal saline sprays. Neti pot recommended. Instructions given. Nasal steroids per medication orders. Azithromycin per medication orders.

## 2010-11-03 ENCOUNTER — Telehealth: Payer: Self-pay | Admitting: *Deleted

## 2010-11-03 MED ORDER — FLUTICASONE PROPIONATE 50 MCG/ACT NA SUSP: 1.0000 | Freq: Every day | NASAL | Status: DC

## 2010-11-03 NOTE — Telephone Encounter (Signed)
Notified pt with changes of nasal spray & md response...11/03/10 @ 3:56pm/LMB

## 2010-11-03 NOTE — Telephone Encounter (Signed)
Pt prev on rhinocort (budesonide) but i do not see this as acceptable trial - change veramyst to fluticosone nasal sp[ray - erx done - let pt know same - if not improved on this generic flonase, pt to let us know - will then change to veramyst if needed - thanks

## 2010-11-03 NOTE — Telephone Encounter (Signed)
Received fax stating needing prior auth for Veramyst Nasal spray. Pls call [phone]. Member ID # [id number]. Called insurance was told by rep. States need to go own website [email or web address] and download form. Form has been downloaded waiting for md response...11/03/10@9 :47am/LMB

## 2010-11-03 NOTE — Telephone Encounter (Signed)
Per insurance form pt must have tried at least 1 nasal spray flunisolide, fluticasone, memotesane furoate or triamcinolone acetonide. Pls advise

## 2010-12-22 ENCOUNTER — Encounter: Payer: Self-pay | Admitting: Internal Medicine

## 2011-01-23 ENCOUNTER — Other Ambulatory Visit: Payer: Self-pay

## 2011-01-23 MED ORDER — CITALOPRAM HYDROBROMIDE 20 MG PO TABS: 20.0000 mg | ORAL_TABLET | Freq: Every day | ORAL | Status: DC

## 2011-02-12 LAB — I-STAT 8, (EC8 V) (CONVERTED LAB)
Bicarbonate: 24.1 — ABNORMAL HIGH
Glucose, Bld: 112 — ABNORMAL HIGH
Hemoglobin: 15.3 — ABNORMAL HIGH
Operator id: 277751
Sodium: 137
TCO2: 25
pCO2, Ven: 34.4 — ABNORMAL LOW

## 2011-02-12 LAB — URINALYSIS, ROUTINE W REFLEX MICROSCOPIC
Bilirubin Urine: NEGATIVE
Glucose, UA: NEGATIVE
Hgb urine dipstick: NEGATIVE
Ketones, ur: 40 — AB
Nitrite: NEGATIVE
Protein, ur: NEGATIVE
Specific Gravity, Urine: 1.016
Urobilinogen, UA: 0.2
pH: 7.5

## 2011-02-12 LAB — URINE CULTURE

## 2011-02-12 LAB — DIFFERENTIAL
Basophils Absolute: 0
Basophils Relative: 0
Eosinophils Relative: 0
Lymphocytes Relative: 16
Neutro Abs: 8 — ABNORMAL HIGH

## 2011-02-12 LAB — CBC
HCT: 41.4
MCHC: 34.6
MCV: 82.9
Platelets: 242
RDW: 12.3

## 2011-02-12 LAB — URINE MICROSCOPIC-ADD ON

## 2011-02-12 LAB — POCT I-STAT CREATININE
Creatinine, Ser: 0.6
Operator id: 277751

## 2011-02-12 LAB — PREGNANCY, URINE: Preg Test, Ur: NEGATIVE

## 2011-03-19 ENCOUNTER — Telehealth: Payer: Self-pay | Admitting: Internal Medicine

## 2011-03-19 NOTE — Telephone Encounter (Signed)
New message:; pt having some symptons and would like to be seen soon.  Does not want to see a PA unless she has too.  Would rather see Dr. Tenny Craw.  It concerns her neurological disorder that Dr. Tenny Craw is aware of.  Please call her back and advise.

## 2011-03-19 NOTE — Telephone Encounter (Signed)
Patient was called she wanted Dr. Tenny Craw to know she feels bad dizzy,nausea,muscles cramping.Stated she thought Dr.Ross might want to change her medications.Stated she does not have any medicine for nausea and will need a new prescription.

## 2011-03-20 NOTE — Telephone Encounter (Addendum)
Called patient and discussed how she has been feeling. She has been having cold symptoms, dizzy,nauseated, and having headaches, and also leg cramps times 3 weeks. She saw the dentist today and he did a xray and informed her that she has a terrible sinus infection and was started on a Zpack. Advised her that Dr.Ross ordered lab work on her. She would like to have labs done at home on Sunday by [facility]. Called Judeth Cornfield at [facility] and she will arrange for lab draw of CBC BMET and CK and get results back to Dr.Ross.

## 2011-03-20 NOTE — Telephone Encounter (Signed)
Patient should have labs:  CBC, BMET, CK.  Could be drawn by home health. She should be seen.  QUestion next clinic for orthostatics and visit (add on). Make sure no chance pregnant.

## 2011-03-26 ENCOUNTER — Encounter: Payer: Self-pay | Admitting: Internal Medicine

## 2011-04-30 ENCOUNTER — Other Ambulatory Visit: Payer: Self-pay | Admitting: Internal Medicine

## 2011-04-30 ENCOUNTER — Telehealth: Payer: Self-pay

## 2011-04-30 MED ORDER — TRIAMCINOLONE ACETONIDE 0.5 % EX OINT: TOPICAL_OINTMENT | Freq: Three times a day (TID) | CUTANEOUS | Status: DC

## 2011-04-30 NOTE — Telephone Encounter (Signed)
Pt called c/o of allergic reaction affecting her lips. Pt states that lips started to blistered and became very painful with mild swelling/redness after using a lip balm 2 days ago. No associated swelling of the tongue, throat or eyes. Pt says she had the same reaction 6 years ago that resolved on its own, but was milder than now. Pt is requesting advisement,  Unable to make OV due to child care issues.

## 2011-04-30 NOTE — Telephone Encounter (Signed)
Use Bendryl prn Use Rx for triamcinolone OV w/Dr Felicity Coyer in 3-4 d Thx

## 2011-04-30 NOTE — Telephone Encounter (Signed)
Pt advised of MD's recommendation and understood.

## 2011-05-02 ENCOUNTER — Other Ambulatory Visit: Payer: Self-pay

## 2011-05-02 MED ORDER — PYRIDOSTIGMINE BROMIDE ER 180 MG PO TBCR: 180.0000 mg | EXTENDED_RELEASE_TABLET | Freq: Every day | ORAL | Status: DC

## 2011-05-11 ENCOUNTER — Ambulatory Visit (INDEPENDENT_AMBULATORY_CARE_PROVIDER_SITE_OTHER): Payer: BC Managed Care – PPO | Admitting: Internal Medicine

## 2011-05-11 ENCOUNTER — Encounter: Payer: Self-pay | Admitting: Internal Medicine

## 2011-05-11 VITALS — BP 110/82 | HR 74 | Temp 98.0°F | Wt 166.0 lb

## 2011-05-11 DIAGNOSIS — K224 Dyskinesia of esophagus: Secondary | ICD-10-CM

## 2011-05-11 DIAGNOSIS — K219 Gastro-esophageal reflux disease without esophagitis: Secondary | ICD-10-CM

## 2011-05-11 DIAGNOSIS — Q078 Other specified congenital malformations of nervous system: Secondary | ICD-10-CM

## 2011-05-11 MED ORDER — PANTOPRAZOLE SODIUM 40 MG PO TBEC: 40.0000 mg | DELAYED_RELEASE_TABLET | Freq: Every day | ORAL | Status: DC

## 2011-05-11 NOTE — Patient Instructions (Signed)
It was good to see you today. Start generic Protonix for acid reflex > hopefully this will improve your esophageal spasm symptoms! Your prescription(s) have been submitted to your pharmacy. Please take as directed and contact our office if you believe you are having problem(s) with the medication(s). If symptoms worse or unimproved, call for refer to GI as discussed

## 2011-05-11 NOTE — Progress Notes (Signed)
  Subjective:    Patient ID: Chelsey Sanford, female    DOB: 1975-09-21, 36 y.o.   MRN: [id number]  HPI Comments: Onset of symptoms 3 days ago. Associated with waves of tightness in the substernal region. Associated with sour sensation, backwash and regurgitation with forward leaning position - no true nausea or vomiting. No abdominal pain or radiation. No prior history of heartburn  Gastrophageal Reflux She complains of chest pain, dysphagia, early satiety, globus sensation and heartburn. She reports no belching, no choking, no coughing, no hoarse voice, no nausea or no sore throat. This is a new problem. The current episode started in the past 7 days. The problem occurs constantly. The problem has been waxing and waning. The heartburn duration is more than one hour. The heartburn is located in the substernum. The heartburn is of severe intensity. The heartburn wakes her from sleep. The heartburn does not limit her activity. The heartburn changes with position. The symptoms are aggravated by certain foods, bending and lying down. Pertinent negatives include no anemia, fatigue or weight loss. Risk factors include no known risk factors. She has tried an antacid for the symptoms. The treatment provided no relief.   Past Medical History  Diagnosis Date  . ALLERGIC RHINITIS   . ANEMIA-NOS   . ENDOMETRIOSIS   . Lumbar disc disease   . DYSAUTONOMIA   . Headache   . Irritable bowel syndrome     Review of Systems  Constitutional: Negative for weight loss and fatigue.  HENT: Negative for sore throat and hoarse voice.   Respiratory: Negative for cough and choking.   Cardiovascular: Positive for chest pain.  Gastrointestinal: Positive for heartburn and dysphagia. Negative for nausea.       Objective:   Physical Exam BP 110/82  Pulse 74  Temp(Src) 98 F (36.7 C) (Oral)  Wt 166 lb (75.297 kg)  SpO2 99% Wt Readings from Last 3 Encounters:  05/11/11 166 lb (75.297 kg)  09/07/10 159 lb 1.9 oz  (72.176 kg)  07/31/10 158 lb (71.668 kg)   Constitutional: She appears well-developed and well-nourished. No distress.  Dtr at side Mouth/Throat: Oropharynx is clear and moist. No oropharyngeal exudate.  Neck: Normal range of motion. Neck supple. No JVD present. No thyromegaly present.  Cardiovascular: Normal rate, regular rhythm and normal heart sounds.  No murmur heard. No BLE edema. Pulmonary/Chest: Effort normal and breath sounds normal. No respiratory distress. She has no wheezes.  Abdominal: Soft. Bowel sounds are normal. She exhibits no distension. There is no tenderness. no masses Psychiatric: She has a normal mood and affect. Her behavior is normal. Judgment and thought content normal.   Lab Results  Component Value Date   WBC 9.1 07/31/2010   HGB 14.2 07/31/2010   HCT 41.4 07/31/2010   PLT 263.0 07/31/2010   GLUCOSE 94 07/31/2010   ALT 32 01/14/2007   AST 22 01/14/2007   NA 134* 07/31/2010   K 4.2 07/31/2010   CL 96 07/31/2010   CREATININE 0.6 07/31/2010   BUN 11 07/31/2010   CO2 27 07/31/2010   TSH 2.85 01/14/2007        Assessment & Plan:  SSCP - consistent with esophageal spasm Possible GERD trigger - Dysautonomia - ?relation if any to above  tx PPI - protonix chosen due to interaction of other PPIs with celexa Consider need for GI eval/EGD if pain remains severe or recurrent regurgitation - local or CH, pt will let us know her preference

## 2011-05-14 ENCOUNTER — Telehealth: Payer: Self-pay

## 2011-05-14 ENCOUNTER — Telehealth: Payer: Self-pay | Admitting: Internal Medicine

## 2011-05-14 DIAGNOSIS — K224 Dyskinesia of esophagus: Secondary | ICD-10-CM

## 2011-05-14 NOTE — Telephone Encounter (Signed)
Patient originally saw Mike Gip PA in 2008 and is a patient of Dr Marina Goodell, she was seen in 08 on request from Dr Tenny Craw as an urgent work-in by Dr Leone Payor..  Patient does have a history of colon in 2010 by [facility].  Dr Felicity Coyer wants her seen this week for dysphagia.  She will come in and see Willette Cluster RNP at 9:30 on 05/17/11.  Corrie Dandy will have her get her most recent records from [facility] that the patient says is a year old prior to the appt.

## 2011-05-14 NOTE — Telephone Encounter (Signed)
Pt informed and will expect a call from [facility] with appt info 

## 2011-05-14 NOTE — Telephone Encounter (Signed)
Will refer to local GI - continue tx as rx'd - thanks

## 2011-05-14 NOTE — Telephone Encounter (Signed)
Pt called stating that she is not better in spite of medication  Rx'd at OV 01/10. Pt believes she is having esophageal spasms. Pt is requesting further advisement from MD, what will next step in treatment be?

## 2011-05-15 ENCOUNTER — Ambulatory Visit: Payer: BC Managed Care – PPO | Admitting: Nurse Practitioner

## 2011-05-15 ENCOUNTER — Encounter: Payer: Self-pay | Admitting: Internal Medicine

## 2011-05-17 ENCOUNTER — Ambulatory Visit (INDEPENDENT_AMBULATORY_CARE_PROVIDER_SITE_OTHER): Payer: BC Managed Care – PPO | Admitting: Nurse Practitioner

## 2011-05-17 ENCOUNTER — Encounter: Payer: Self-pay | Admitting: Nurse Practitioner

## 2011-05-17 VITALS — BP 116/80 | HR 80 | Ht 66.0 in | Wt 167.0 lb

## 2011-05-17 DIAGNOSIS — K219 Gastro-esophageal reflux disease without esophagitis: Secondary | ICD-10-CM

## 2011-05-17 DIAGNOSIS — R079 Chest pain, unspecified: Secondary | ICD-10-CM

## 2011-05-17 NOTE — Progress Notes (Signed)
. 05/17/2011 Chelsey Sanford [id number] 07/19/1975   HISTORY OF PRESENT ILLNESS: Patient is a 36 year old female with a history of dysautonomia, followed at [facility]. She sees Dr. Dietrich Pates for associated labile blood pressures. Patient was seen here in 2009 for rectal bleeding and altered bowel habits. Following that she was seen by GI at [facility] where her neurologist practices. Patient has a long history of alternating constipation and diarrhea which has been attributed to dysautonomia. Overall patient feels she manages her symptoms to the best of her ability. Alleen  comes in today for an unrelated problem. She gives a one week history of regurgitation, solid food dysphagia, and chest pressure. Symptoms started with an episode of significant chest discomfort. She now has sensation that something is stuck in her throat. This has never happened to her before.Tums and Protonix over last several days hasn't made a noticeable difference except chest pressure may be better. No thrush, she had a Z-Pak a couple of months ago. No odynophagia.   Past Medical History  Diagnosis Date  . ALLERGIC RHINITIS   . ANEMIA-NOS   . ENDOMETRIOSIS   . Lumbar disc disease   . DYSAUTONOMIA   . Headache   . Irritable bowel syndrome   . Anxiety    Past Surgical History  Procedure Date  . Ankle reconstruction 1993  . Tonsillectomy 1996  . Wisdom tooth extraction     reports that she quit smoking about 15 years ago. She has never used smokeless tobacco. She reports that she drinks alcohol. She reports that she does not use illicit drugs. family history includes Colon polyps in her mother; Crohn's disease in her maternal grandmother; Migraines in her mother; Skin cancer in her mother; and Thyroid disease in her mother.  There is no history of COPD and Colon cancer. Allergies  Allergen Reactions  . Amoxicillin   . Erythromycin   . Penicillins   . Sulfonamide Derivatives      Outpatient Encounter Prescriptions as of  05/17/2011  Medication Sig Dispense Refill  . citalopram (CELEXA) 20 MG tablet Take 1 tablet (20 mg total) by mouth daily.  90 tablet  1  . cyanocobalamin (,VITAMIN B-12,) 1000 MCG/ML injection 1 injection montly       . cyclobenzaprine (FLEXERIL) 10 MG tablet 1-2 tablets every 8 hours as needed      . docusate sodium (COLACE) 100 MG capsule Once daily and as needed      . drospirenone-ethinyl estradiol (YAZ) 3-0.02 MG per tablet Take 1 tablet by mouth daily.        . fludrocortisone (FLORINEF) 0.1 MG tablet Take 1.5 mg by mouth daily.      . fluticasone (FLONASE) 50 MCG/ACT nasal spray Place 1 spray into the nose daily.  16 g  2  . iron polysaccharides (NU-IRON) 150 MG capsule Take 150 mg by mouth daily.        . magnesium oxide (MAG-OX) 400 MG tablet Take 400 mg by mouth daily.      . pantoprazole (PROTONIX) 40 MG tablet Take 1 tablet (40 mg total) by mouth daily.  30 tablet  3  . pindolol (VISKEN) 5 MG tablet Take 1 tablet (5 mg total) by mouth 2 (two) times daily.  60 tablet  11  . polyethylene glycol (MIRALAX / GLYCOLAX) packet Take 17 g by mouth daily.        Marland Kitchen pyridostigmine (MESTINON) 180 MG CR tablet Take 1 tablet (180 mg total) by mouth daily.  30 tablet  1  . sodium chloride (CURITY STERILE SALINE) 0.9 % irrigation Irrigate with as directed. Once weekly      . fludrocortisone (FLORINEF) 0.1 MG tablet 1 1/2 tablets two times a day        REVIEW OF SYSTEMS  : Positive for headaches, fatigue, back pain, sleeping problems. All other systems reviewed and negative except where noted in the History of Present Illness.  PHYSICAL EXAM: BP 116/80  Pulse 80  Ht 5\' 6"  (1.676 m)  Wt 167 lb (75.751 kg)  BMI 26.95 kg/m2  LMP 04/23/2011 General: Well developed white female in no acute distress Head: Normocephalic and atraumatic Eyes:  sclerae anicteric,conjunctive pink. Ears: Normal auditory acuity Mouth: No deformity or lesions Neck: Supple, no masses.  Lungs: Clear throughout to  auscultation Heart: Regular rate and rhythm; no murmurs heard Abdomen: Soft, non distended, nontender. No masses or hepatomegaly noted. Normal Bowel sounds Rectal: not done Musculoskeletal: Symmetrical with no gross deformities  Skin: No lesions on visible extremities Extremities: No edema or deformities noted Neurological: Alert oriented x 4, grossly nonfocal Cervical Nodes:  No significant cervical adenopathy Psychological:  Alert and cooperative. Normal mood and affect  ASSESSMENT AND PLAN; 1. GERD type symptoms with chest pressure,solid food dysphagia and regurgitation. Her symptoms are acute, consider pill esophagitis / ulceration. Rule out esophageal spasms. Esophageal stricture seems less likely given acute onset. Recommend patient continue daily PPI. For further evaluation will proceed with upper endoscopy. Procedure will be done with Propofol. The benefits, risks, and potential complications of EGD with possible biopsies and/or dilation were discussed with the patient and she agrees to proceed.   2. Altered bowel habits, chronic, and felt to be related to her dysautonomia. Bowel movements at baseline.Colonoscopy done for diarrhea and rectal bleeding in June 2010 by Dr. Mechele Claude at [facility] revealed a normal colon with internal hemorrhoids.  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 Patient hasn't seen a gastroenterologist since.   3. 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 Dysautonomia, formerly followed at [facility]. She has labile blood pressure associated with dysautonomia and is followed by Dr. Tenny Craw.

## 2011-05-17 NOTE — Patient Instructions (Signed)
We have scheduled the Endoscopy with Dr. Stan Head. Directions and brochure provided. Your sedation will be PROPOFOL. We have provided you information about the sedation.

## 2011-05-19 NOTE — Progress Notes (Signed)
Reviewed and agree with management. Kyliegh Jester T. Jamal Pavon MD FACG 

## 2011-05-22 ENCOUNTER — Telehealth: Payer: Self-pay | Admitting: Internal Medicine

## 2011-05-22 NOTE — Telephone Encounter (Signed)
Per Dr Leone Payor ok to do EGD without propofol if she is willing to try.  Patient was given fentanyl and versed for her colon and is fine with proceeding with endo without propofol.  She is scheduled for EGD on 05/28/11 without propofol.

## 2011-05-24 ENCOUNTER — Encounter: Payer: BC Managed Care – PPO | Admitting: Internal Medicine

## 2011-05-25 ENCOUNTER — Ambulatory Visit: Payer: BC Managed Care – PPO | Admitting: Internal Medicine

## 2011-05-28 ENCOUNTER — Ambulatory Visit (AMBULATORY_SURGERY_CENTER): Payer: BC Managed Care – PPO | Admitting: Internal Medicine

## 2011-05-28 ENCOUNTER — Encounter: Payer: BC Managed Care – PPO | Admitting: Internal Medicine

## 2011-05-28 ENCOUNTER — Encounter: Payer: Self-pay | Admitting: Internal Medicine

## 2011-05-28 VITALS — BP 128/75 | HR 70 | Temp 96.4°F | Resp 16 | Ht 66.0 in | Wt 167.0 lb

## 2011-05-28 DIAGNOSIS — R1319 Other dysphagia: Secondary | ICD-10-CM

## 2011-05-28 DIAGNOSIS — K219 Gastro-esophageal reflux disease without esophagitis: Secondary | ICD-10-CM

## 2011-05-28 MED ORDER — SODIUM CHLORIDE 0.9 % IV SOLN: 500.0000 mL | INTRAVENOUS | Status: DC

## 2011-05-28 NOTE — Progress Notes (Signed)
Patient did not experience any of the following events: a burn prior to discharge; a fall within the facility; wrong site/side/patient/procedure/implant event; or a hospital transfer or hospital admission upon discharge from the facility. (G8907) Patient did not have preoperative order for IV antibiotic SSI prophylaxis. (G8918)  

## 2011-05-28 NOTE — Patient Instructions (Addendum)
The only abnormality I saw was a small hiatal hernia (stomach moves into chest some). This is very common and I doubt it is related to your symptoms. i did dilate the esophagus to see if that will help. Please follow the post-dilation diet.You may need further evaluation of the motility or movement of the esophagus and stomach, it is possible symptoms could be related to dysautonomia. If your symptoms persist over the next few weeks I think returning to [facility] (more motility expertise there) would most likely make sense. Call me and I will try to help with that. Iva Boop, MD, [facility]  Please refer to blue and green discharge instruction sheets and hand-outs.

## 2011-05-28 NOTE — Op Note (Signed)
[facility] [address] [place], [place]  [address]  ENDOSCOPY PROCEDURE REPORT  PATIENT:  Chelsey, Sanford  MR#:  [id number] BIRTHDATE:  12-Sep-1975, 35 yrs. old  GENDER:  female  ENDOSCOPIST:  Iva Boop, MD, [facility]  PROCEDURE DATE:  05/28/2011 PROCEDURE:  EGD, diagnostic 43235, Maloney Dilation of Esophagus ASA CLASS:  Class II INDICATIONS:  reflux symptoms despite therapy, dysphagia  MEDICATIONS:   These medications were titrated to patient response per physician's verbal order, Fentanyl 50 mcg IV, Versed 6 mg IV TOPICAL ANESTHETIC:  Cetacaine Spray  DESCRIPTION OF PROCEDURE:   After the risks benefits and alternatives of the procedure were thoroughly explained, informed consent was obtained.  The LB GIF-H180 [id number] endoscope was introduced through the mouth and advanced to the second portion of the duodenum, without limitations.  The instrument was slowly withdrawn as the mucosa was fully examined. <<PROCEDUREIMAGES>>  A hiatal hernia was found. It was 2 cm in size. 35-37 cm. The Z-line was slightly irregular but nothing suspicious seen. Otherwise the examination was normal.    Retroflexed views also revealed a hiatal hernia.    The scope was then withdrawn from the patient, and since she had dysphagia 41 French Maloney dilator was passed easily and without heme,  and the procedure completed.  COMPLICATIONS:  None  ENDOSCOPIC IMPRESSION: 1) 2 cm hiatal hernia 2) Otherwise normal examination - 54 French dilator passed due to dysphagia symptoms RECOMMENDATIONS: 1) Post-dilation diet today-tomorrow 2) If persisetnt problems over next several weeks then call back - think would need more of a motility evaluation given dysautonomia - and most likely at [facility] where there is more expertise  Iva Boop, MD, [facility]  CC:  Pricilla Riffle, MD, Newt Lukes, MD and The Patient  n. eSIGNED:   Iva Boop at 05/28/2011 10:16 AM  Vicente Masson, [phone]

## 2011-05-29 ENCOUNTER — Telehealth: Payer: Self-pay

## 2011-05-29 NOTE — Telephone Encounter (Signed)
  Follow up Call-  Call back number 05/28/2011  Post procedure Call Back phone  # [phone]  Permission to leave phone message Yes     Patient questions:  Do you have a fever, pain , or abdominal swelling? no Pain Score  0 *  Have you tolerated food without any problems? yes  Have you been able to return to your normal activities? yes  Do you have any questions about your discharge instructions: Diet   yes Medications  no Follow up visit  no  Do you have questions or concerns about your Care? no  Actions: * If pain score is 4 or above: No action needed, pain <4.

## 2011-06-05 ENCOUNTER — Telehealth: Payer: Self-pay | Admitting: Internal Medicine

## 2011-06-05 NOTE — Telephone Encounter (Signed)
She has seen a gastroenterologist at [facility] in past - referred by her dysautonomia specialist at [facility]. I recommend she see that GI person again. Do not know the name - am happy to make a referral if she gets name. She may be able to get this faster through her dysautonomia specialist, perhaps. If she calls them they could likely get an appointment sooner (I suspect).

## 2011-06-05 NOTE — Telephone Encounter (Signed)
Left message for patient to call back  

## 2011-06-05 NOTE — Telephone Encounter (Signed)
She is having continued symptoms, absolutely no improvement.  She is asking the next step.  Motility study or return to [facility] referral.

## 2011-06-05 NOTE — Telephone Encounter (Signed)
Patient has seen Debroah Baller and Mechele Claude at [facility].  I have scheduled her an appt for Debroah Baller, [place] for 07-04-11 11:15.  The patient is aware of the appt

## 2011-06-08 ENCOUNTER — Encounter: Payer: Self-pay | Admitting: Internal Medicine

## 2011-06-08 ENCOUNTER — Ambulatory Visit (INDEPENDENT_AMBULATORY_CARE_PROVIDER_SITE_OTHER): Payer: BC Managed Care – PPO | Admitting: Internal Medicine

## 2011-06-08 VITALS — BP 118/78 | HR 80 | Ht 66.0 in | Wt 162.0 lb

## 2011-06-08 DIAGNOSIS — Q078 Other specified congenital malformations of nervous system: Secondary | ICD-10-CM

## 2011-06-08 NOTE — Progress Notes (Signed)
HPI Patient is a 36 year old with a history of autonomic dysfunction and POTS.  I saw her back in April 2012. Since seen she continues to get IV fluids every couple weeks.  She gets some dizziness.  HA seem to have improved.  Her biggest complaint now is difficulty with and pain with swallowing fluids and food.  She has been seen by C. Leone Payor.  A hiatal hernia was seen, otherwise it was unremarkable.  She has an appt at [facility]. Allergies  Allergen Reactions  . Amoxicillin   . Erythromycin   . Penicillins   . Sulfonamide Derivatives     Current Outpatient Prescriptions  Medication Sig Dispense Refill  . citalopram (CELEXA) 20 MG tablet Take 1 tablet (20 mg total) by mouth daily.  90 tablet  1  . cyanocobalamin (,VITAMIN B-12,) 1000 MCG/ML injection 1 injection montly       . cyclobenzaprine (FLEXERIL) 10 MG tablet 1-2 tablets every 8 hours as needed      . docusate sodium (COLACE) 100 MG capsule Once daily and as needed      . drospirenone-ethinyl estradiol (YAZ) 3-0.02 MG per tablet Take 1 tablet by mouth daily.        . fludrocortisone (FLORINEF) 0.1 MG tablet Take 0.1 mg by mouth daily. Takes 1      . fluticasone (FLONASE) 50 MCG/ACT nasal spray Place 1 spray into the nose daily.  16 g  2  . iron polysaccharides (NU-IRON) 150 MG capsule Take 150 mg by mouth daily.        . magnesium oxide (MAG-OX) 400 MG tablet Take 400 mg by mouth daily.      . meloxicam (MOBIC) 15 MG tablet Take 15 mg by mouth daily as needed.        . pantoprazole (PROTONIX) 40 MG tablet Take 40 mg by mouth 2 (two) times daily.      . pindolol (VISKEN) 5 MG tablet Take 1 tablet (5 mg total) by mouth 2 (two) times daily.  60 tablet  11  . polyethylene glycol (MIRALAX / GLYCOLAX) packet Take 17 g by mouth daily. As needed      . pyridostigmine (MESTINON) 180 MG CR tablet Take 1 tablet (180 mg total) by mouth daily.  30 tablet  1  . sodium chloride (CURITY STERILE SALINE) 0.9 % irrigation Irrigate with as directed. Once  weekly      . fludrocortisone (FLORINEF) 0.1 MG tablet 1 1/2 tablets two times a day         Past Medical History  Diagnosis Date  . ALLERGIC RHINITIS   . ANEMIA-NOS   . ENDOMETRIOSIS   . Lumbar disc disease   . DYSAUTONOMIA   . Headache   . Irritable bowel syndrome   . Anxiety   . Delayed gastric emptying     dx at [facility] by gastric emptying study per patient    Past Surgical History  Procedure Date  . Ankle reconstruction 1993  . Tonsillectomy 1996  . Wisdom tooth extraction     Family History  Problem Relation Age of Onset  . Thyroid disease Mother     hypothyroidism  . Migraines Mother   . COPD Neg Hx   . Colon cancer Neg Hx   . Colon polyps Mother   . Skin cancer Mother   . Crohn's disease Maternal Grandmother     History   Social History  . Marital Status: Married    Spouse Name: N/A  Number of Children: 1  . Years of Education: N/A   Occupational History  . Property Management    Social History Main Topics  . Smoking status: Former Smoker    Quit date: 04/30/1996  . Smokeless tobacco: Never Used  . Alcohol Use: Yes     Occasional wine  . Drug Use: No  . Sexually Active: Not on file   Other Topics Concern  . 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  They are negative to the above problem except as previously stated.  Vital Signs: BP 118/78  Pulse 80  Ht 5\' 6"  (1.676 m)  Wt 162 lb (73.483 kg)  BMI 26.15 kg/m2  LMP 05/26/2011  Physical Exam Patient is in NAD.  HEENT:  Normocephalic, atraumatic. EOMI, PERRLA.  Neck: JVP is normal. No thyromegaly. No bruits.  Lungs: clear to auscultation. No rales no wheezes.  Heart: Regular rate and rhythm. Normal S1, S2. No S3.   No significant murmurs. PMI not  displaced.  Abdomen:  Supple, nontender. Normal bowel sounds. No masses. No hepatomegaly.  Extremities:   Good distal pulses throughout. No lower extremity edema.  Musculoskeletal :moving all extremities.  Neuro:   alert and oriented x3.  CN II-XII grossly intact.   Assessment and Plan:

## 2011-06-27 NOTE — Assessment & Plan Note (Signed)
Patient with dysautonomia.  Has done fairly well on regimen.  I am concerned that this represents some worsening with GI symptoms.  I agree with referral to [facility] where she has been seen before.  I would not make any changes fro now.  Take activities as tolerated.

## 2011-07-12 ENCOUNTER — Other Ambulatory Visit: Payer: BC Managed Care – PPO | Admitting: Internal Medicine

## 2011-07-15 ENCOUNTER — Other Ambulatory Visit: Payer: Self-pay | Admitting: Internal Medicine

## 2011-08-02 ENCOUNTER — Emergency Department (HOSPITAL_COMMUNITY): Payer: BC Managed Care – PPO

## 2011-08-02 ENCOUNTER — Encounter (HOSPITAL_COMMUNITY): Payer: Self-pay

## 2011-08-02 ENCOUNTER — Other Ambulatory Visit: Payer: Self-pay

## 2011-08-02 ENCOUNTER — Emergency Department (HOSPITAL_COMMUNITY)
Admission: EM | Admit: 2011-08-02 | Discharge: 2011-08-02 | Disposition: A | Discharge status: Home / Self Care | Payer: BC Managed Care – PPO | Attending: Emergency Medicine | Admitting: Emergency Medicine

## 2011-08-02 DIAGNOSIS — R079 Chest pain, unspecified: Secondary | ICD-10-CM | POA: Insufficient documentation

## 2011-08-02 DIAGNOSIS — K229 Disease of esophagus, unspecified: Secondary | ICD-10-CM | POA: Insufficient documentation

## 2011-08-02 DIAGNOSIS — Z79899 Other long term (current) drug therapy: Secondary | ICD-10-CM | POA: Insufficient documentation

## 2011-08-02 LAB — POCT I-STAT, CHEM 8
BUN: 11 mg/dL (ref 6–23)
Calcium, Ion: 1.13 mmol/L (ref 1.12–1.32)
Creatinine, Ser: 1 mg/dL (ref 0.50–1.10)
Glucose, Bld: 88 mg/dL (ref 70–99)
TCO2: 24 mmol/L (ref 0–100)

## 2011-08-02 MED ORDER — HYDROMORPHONE HCL PF 1 MG/ML IJ SOLN
1.0000 mg | Freq: Once | INTRAMUSCULAR | Status: AC
  Administered 2011-08-02: 1 mg via INTRAVENOUS
  Filled 2011-08-02: qty 1

## 2011-08-02 MED ORDER — MORPHINE SULFATE 4 MG/ML IJ SOLN
4.0000 mg | Freq: Once | INTRAMUSCULAR | Status: AC
  Administered 2011-08-02: 4 mg via INTRAVENOUS
  Filled 2011-08-02: qty 1

## 2011-08-02 MED ORDER — ONDANSETRON HCL 4 MG/2ML IJ SOLN
4.0000 mg | Freq: Once | INTRAMUSCULAR | Status: AC
  Administered 2011-08-02: 4 mg via INTRAVENOUS
  Filled 2011-08-02: qty 2

## 2011-08-02 MED ORDER — ACETAMINOPHEN-CODEINE 120-12 MG/5ML PO SOLN: 5.0000 mL | Freq: Four times a day (QID) | ORAL | Status: AC | PRN

## 2011-08-02 NOTE — ED Provider Notes (Signed)
History     CSN: [id number]  Arrival date & time 08/02/11  1528   None     Chief Complaint  Patient presents with  . Chest Pain    (Consider location/radiation/quality/duration/timing/severity/associated sxs/prior treatment) HPI Pt here with CC SS and R lower CP onset today after the pt had an upper endoscopy where multiple esophageal and gastric biopsies  Pain is sharp, worse with deep breath and movement.  Associated with mild left sided neck discomfort.  Pain is mild to moderate.  Past Medical History  Diagnosis Date  . ALLERGIC RHINITIS   . ANEMIA-NOS   . ENDOMETRIOSIS   . Lumbar disc disease   . DYSAUTONOMIA   . Headache   . Irritable bowel syndrome   . Anxiety   . Delayed gastric emptying     dx at [facility] by gastric emptying study per patient    Past Surgical History  Procedure Date  . Ankle reconstruction 1993  . Tonsillectomy 1996  . Wisdom tooth extraction     Family History  Problem Relation Age of Onset  . Thyroid disease Mother     hypothyroidism  . Migraines Mother   . COPD Neg Hx   . Colon cancer Neg Hx   . Colon polyps Mother   . Skin cancer Mother   . Crohn's disease Maternal Grandmother     History  Substance Use Topics  . Smoking status: Former Smoker    Quit date: 04/30/1996  . Smokeless tobacco: Never Used  . Alcohol Use: Yes     Occasional wine    OB History    Grav Para Term Preterm Abortions TAB SAB Ect Mult Living                  Review of Systems  Constitutional: Negative for fever and chills.  Respiratory: Negative for cough and shortness of breath.   Cardiovascular: Positive for chest pain. Negative for palpitations and leg swelling.  Gastrointestinal: Negative for nausea, vomiting and abdominal pain.  All other systems reviewed and are negative.    Allergies  Amoxicillin; Erythromycin; Penicillins; and Sulfonamide derivatives  Home Medications   Current Outpatient Rx  Name Route Sig Dispense Refill  .  CITALOPRAM HYDROBROMIDE 20 MG PO TABS Oral Take 20 mg by mouth daily.    . CYANOCOBALAMIN 1000 MCG/ML IJ SOLN  1 injection montly     . DOCUSATE SODIUM 100 MG PO CAPS Oral Take 100 mg by mouth daily.     . DROSPIRENONE-ETHINYL ESTRADIOL 3-0.02 MG PO TABS Oral Take 1 tablet by mouth daily.      Marland Kitchen FLUDROCORTISONE ACETATE 0.1 MG PO TABS Oral Take 0.05-0.1 mg by mouth See admin instructions. Takes 1 tablet in the morning and 1/2 tablet in the afternoon    . POLYSACCHARIDE IRON COMPLEX 150 MG PO CAPS Oral Take 150 mg by mouth daily.      Marland Kitchen MAGNESIUM OXIDE 400 MG PO TABS Oral Take 400 mg by mouth daily.    . MESTINON 180 MG PO TBCR  TAKE 1 TABLET BY MOUTH DAILY 30 tablet 0  . PANTOPRAZOLE SODIUM 40 MG PO TBEC Oral Take 40 mg by mouth 2 (two) times daily.    Marland Kitchen PINDOLOL 5 MG PO TABS Oral Take 5 mg by mouth daily.    . SODIUM CHLORIDE 0.9 % IR SOLN Irrigation Irrigate with as directed. Once weekly    . ACETAMINOPHEN-CODEINE 120-12 MG/5ML PO SOLN Oral Take 5 mLs by mouth every 6 (  six) hours as needed for pain. 120 mL 0    BP 118/75  Pulse 79  Temp(Src) 97.7 F (36.5 C) (Oral)  Resp 12  SpO2 97%  LMP 07/19/2011  Physical Exam  Nursing note and vitals reviewed. Constitutional: She appears well-developed and well-nourished.  HENT:  Head: Normocephalic and atraumatic.  Eyes: Right eye exhibits no discharge. Left eye exhibits no discharge.  Neck: Normal range of motion. Neck supple.  Cardiovascular: Normal rate, regular rhythm and normal heart sounds.   Pulmonary/Chest: Effort normal and breath sounds normal.  Abdominal: Soft. There is no tenderness.  Musculoskeletal: She exhibits no edema and no tenderness.  Neurological: She is alert. GCS eye subscore is 4. GCS verbal subscore is 5. GCS motor subscore is 6.  Skin: Skin is warm and dry.  Psychiatric: She has a normal mood and affect. Her behavior is normal.    ED Course  Procedures (including critical care time)   Labs Reviewed  POCT  I-STAT, CHEM 8  LAB REPORT - SCANNED   Dg Chest 2 View  08/02/2011  *RADIOLOGY REPORT*  Clinical Data: Recent endoscopic dilatation.  Concern for esophageal leak.  CHEST - 2 VIEW  Comparison: 12/10/2006  Findings:  Normal heart size with clear lung fields.  No bony abnormality.  No effusion, mediastinal air, and mediastinal widening, or pneumothorax. No change from priors appear  IMPRESSION: No active cardiopulmonary disease.  Original Report Authenticated By: Elsie Stain, M.D.   Ct Chest Wo Contrast  08/02/2011  *RADIOLOGY REPORT*  Clinical Data: Chest pain developing after endoscopic biopsies of the esophagus.  Severe pain with inspiration.  CT CHEST WITHOUT CONTRAST  Technique:  Multidetector CT imaging of the chest was performed using the standard protocol without intravenous contrast.  Contrast:   No IV contrast was given, but the patient sipped on oral contrast before the scan started and slowly throughout the scan.  Comparison:  Chest x-ray earlier today.  Findings:  No significant mediastinal, hilar, or axillary lymphadenopathy.   Heart size normal.  No evidence of pericardial effusion.  Thoracic and upper abdominal aorta normal.  Pulmonary parenchyma clear without evidence of nodule or mass, localized consolidation, or significant interstitial lung disease. No pleural effusions.  No evidence for mediastinal air.  No extraluminal extravasation of contrast.  Extrathoracic soft tissues appear unremarkable.  Visualized upper abdomen unremarkable.  IMPRESSION: Normal CT of the chest. No evidence for esophageal perforation or contrast leakage.  No pleural effusion or pneumothorax.  Original Report Authenticated By: Elsie Stain, M.D.     1. Chest pain   2. Esophageal abnormality       MDM  Pt is in nad, afvss, nontoxic appearing, exam and hx as above.  C/f esophageal injury.  cxr nad, getting ct chest with barium swallow, basic labs.  Ct shows nothing acute, will d/c, return warnings given.   Pt to f/u with Dr Erma Heritage, her GI.        Elijio Miles, MD 08/03/11 1141

## 2011-08-02 NOTE — Discharge Instructions (Signed)
Take your medication as prescribed. Follow up with Dr. Penne Lash as discussed.  RESOURCE GUIDE  Dental Problems  Patients with Medicaid: [facility] [phone]                                           [phone] [address] Phone:  [phone]                                                   Phone:  [phone]  If unable to pay or uninsured, contact:  [facility] or [facility]. to become qualified for the adult dental clinic.  Chronic Pain Problems Contact [facility]  [phone] Patients need to be referred by their primary care doctor.  Insufficient Money for Medicine Contact [employer]:  call "211" or [facility] [phone].  No Primary Care Doctor Call [facility]  [phone] Other agencies that provide inexpensive medical care    [facility]  [phone]    [facility]  [phone]    [facility]  [phone]    [facility]  [phone]    [facility]  [phone]    [facility]  [phone]  Psychological Services [facility]  [phone] [facility]  [phone] [facility]   [phone] (emergency services [phone])  Abuse/Neglect [facility] Child Abuse Hotline 252-534-9855 [facility] Child Abuse Hotline [phone] (After Hours)  Emergency Shelter [facility] [phone]  [facility] [place] [facility] [phone] [facility] [phone]  MRSA Hotline #:   918 436 7608    [facility] Resources  [facility]  [employer]                           [facility] Dept. [address] [place]                     [address]         [address]  Blondell Reveal Phone:  [phone]                                   Phone:  [phone]                   Phone:  [phone]  [facility] Phone:  [phone]  [facility] Child Abuse Hotline 6158799214 [phone] (After Hours)

## 2011-08-02 NOTE — ED Notes (Signed)
Returned to room from CT. 

## 2011-08-02 NOTE — ED Notes (Signed)
Pt.  Had a motility study done at [facility] this morning. Endoscopy with  Biopsies performed.   Pt. Was discharged home  And then developed rt. Side chest pain and severe pain on her lt. Lateral neck area denies any sob but does have severe pain  With inspirations

## 2011-08-06 NOTE — ED Provider Notes (Addendum)
I saw and evaluated the patient, reviewed the resident's note and I agree with the findings and plan.  CP immediately after manometry/upper endoscopy biopsy x 5. D/W [facility] Dr. Bernita Buffy who performed the procedure which was without obvious complication. Screening CXR unremarkable. CT w/oral contrast without extravasation. Cardiac etiology not pursued given low likelihood. Feeling better. Dr. Penne Lash will call to f/u tomorrow.   Date: 08/06/2011  Rate: 75  Rhythm: normal sinus rhythm  QRS Axis: normal  Intervals: normal  ST/T Wave abnormalities: normal  Conduction Disutrbances:none  No significant change compared to previous EKG  Forbes Cellar, MD 08/06/11 0865  Forbes Cellar, MD 08/06/11 [phone]

## 2011-08-10 ENCOUNTER — Other Ambulatory Visit: Payer: Self-pay | Admitting: Internal Medicine

## 2011-09-14 ENCOUNTER — Encounter: Payer: Self-pay | Admitting: Internal Medicine

## 2011-09-14 ENCOUNTER — Ambulatory Visit (INDEPENDENT_AMBULATORY_CARE_PROVIDER_SITE_OTHER): Payer: BC Managed Care – PPO | Admitting: Internal Medicine

## 2011-09-14 ENCOUNTER — Other Ambulatory Visit (INDEPENDENT_AMBULATORY_CARE_PROVIDER_SITE_OTHER): Payer: BC Managed Care – PPO

## 2011-09-14 VITALS — BP 102/82 | HR 88 | Temp 98.5°F | Ht 66.0 in | Wt 165.0 lb

## 2011-09-14 DIAGNOSIS — I998 Other disorder of circulatory system: Secondary | ICD-10-CM

## 2011-09-14 DIAGNOSIS — R58 Hemorrhage, not elsewhere classified: Secondary | ICD-10-CM

## 2011-09-14 LAB — CBC WITH DIFFERENTIAL/PLATELET
Basophils Absolute: 0 10*3/uL (ref 0.0–0.1)
Basophils Relative: 0.3 % (ref 0.0–3.0)
Eosinophils Absolute: 0.3 10*3/uL (ref 0.0–0.7)
MCHC: 33.7 g/dL (ref 30.0–36.0)
MCV: 83.7 fl (ref 78.0–100.0)
Monocytes Absolute: 0.6 10*3/uL (ref 0.1–1.0)
Neutrophils Relative %: 56.8 % (ref 43.0–77.0)
Platelets: 255 10*3/uL (ref 150.0–400.0)
RBC: 4.71 Mil/uL (ref 3.87–5.11)
RDW: 12.6 % (ref 11.5–14.6)

## 2011-09-14 LAB — PROTIME-INR: Prothrombin Time: 10 s — ABNORMAL LOW (ref 10.2–12.4)

## 2011-09-14 NOTE — Patient Instructions (Signed)
It was good to see you today. Suspect bruise is related to mild trauma in past few weeks - apply ice 3x/day x 5 min per episode for next 3 days, then as needed Test(s) ordered today. Your results will be called to you after review (48-72hours after test completion). If any changes need to be made, you will be notified at that time.

## 2011-09-14 NOTE — Progress Notes (Signed)
  Subjective:    Patient ID: Chelsey Sanford, female    DOB: 12/08/1975, 36 y.o.   MRN: [id number]  HPI complains of bruise R posterior thigh Denies trauma or pain No other rash or bruise No med changes No hx same  Past Medical History  Diagnosis Date  . ALLERGIC RHINITIS   . ANEMIA-NOS   . ENDOMETRIOSIS   . Lumbar disc disease   . DYSAUTONOMIA   . Headache   . Irritable bowel syndrome   . Anxiety   . Delayed gastric emptying     dx at [facility] by gastric emptying study per patient    Review of Systems  Musculoskeletal: Negative for myalgias, back pain, joint swelling and gait problem.       Objective:   Physical Exam BP 102/82  Pulse 88  Temp(Src) 98.5 F (36.9 C) (Oral)  Ht 5\' 6"  (1.676 m)  Wt 165 lb (74.844 kg)  BMI 26.63 kg/m2  SpO2 95% Wt Readings from Last 3 Encounters:  09/14/11 165 lb (74.844 kg)  06/08/11 162 lb (73.483 kg)  05/28/11 167 lb (75.751 kg)   Constitutional: She appears well-developed and well-nourished. No distress. Musculoskeletal: no mass or lumps. Normal range of motion, no joint effusions. No gross deformities Skin: ecchymosis on r posterior thigh. Remaining skin is warm and dry. No other bruising or petechia or rash noted. No erythema.  Psychiatric: She has a normal mood and affect. Her behavior is normal. Judgment and thought content normal.   Lab Results  Component Value Date   WBC 9.1 07/31/2010   HGB 14.6 08/02/2011   HCT 43.0 08/02/2011   PLT 263.0 07/31/2010   GLUCOSE 88 08/02/2011   ALT 32 01/14/2007   AST 22 01/14/2007   NA 140 08/02/2011   K 3.9 08/02/2011   CL 106 08/02/2011   CREATININE 1.00 08/02/2011   BUN 11 08/02/2011   CO2 27 07/31/2010   TSH 2.85 01/14/2007        Assessment & Plan:  ecchymosis R posterior thigh - suspect incidental unrecalled mild trauma - no mass on exam  Check CBC and pt/ptt Reassurance provided - pt to call if worse or unimproved

## 2011-09-18 ENCOUNTER — Other Ambulatory Visit: Payer: Self-pay | Admitting: Internal Medicine

## 2011-09-24 ENCOUNTER — Other Ambulatory Visit: Payer: Self-pay | Admitting: Internal Medicine

## 2011-10-05 ENCOUNTER — Telehealth: Payer: Self-pay

## 2011-10-05 ENCOUNTER — Ambulatory Visit (INDEPENDENT_AMBULATORY_CARE_PROVIDER_SITE_OTHER): Payer: BC Managed Care – PPO | Admitting: Internal Medicine

## 2011-10-05 ENCOUNTER — Encounter: Payer: Self-pay | Admitting: Internal Medicine

## 2011-10-05 VITALS — BP 112/80 | HR 76 | Temp 99.1°F | Ht 66.0 in | Wt 164.0 lb

## 2011-10-05 DIAGNOSIS — K219 Gastro-esophageal reflux disease without esophagitis: Secondary | ICD-10-CM

## 2011-10-05 DIAGNOSIS — H6690 Otitis media, unspecified, unspecified ear: Secondary | ICD-10-CM

## 2011-10-05 DIAGNOSIS — H6692 Otitis media, unspecified, left ear: Secondary | ICD-10-CM

## 2011-10-05 DIAGNOSIS — R42 Dizziness and giddiness: Secondary | ICD-10-CM

## 2011-10-05 DIAGNOSIS — J309 Allergic rhinitis, unspecified: Secondary | ICD-10-CM

## 2011-10-05 MED ORDER — LEVOFLOXACIN 250 MG PO TABS: 250.0000 mg | ORAL_TABLET | Freq: Every day | ORAL | Status: AC

## 2011-10-05 MED ORDER — PANTOPRAZOLE SODIUM 40 MG PO TBEC: 40.0000 mg | DELAYED_RELEASE_TABLET | Freq: Two times a day (BID) | ORAL | Status: DC

## 2011-10-05 MED ORDER — MECLIZINE HCL 12.5 MG PO TABS: 12.5000 mg | ORAL_TABLET | Freq: Three times a day (TID) | ORAL | Status: AC | PRN

## 2011-10-05 MED ORDER — TRIAMCINOLONE ACETONIDE(NASAL) 55 MCG/ACT NA INHA: 2.0000 | Freq: Every day | NASAL | Status: DC

## 2011-10-05 NOTE — Telephone Encounter (Signed)
Fax from pharmacy stating possible drug interaction between levofloxacin 250 mg and celexa, please advise advise

## 2011-10-05 NOTE — Telephone Encounter (Signed)
Ok this time 

## 2011-10-05 NOTE — Telephone Encounter (Signed)
Pharmacy informed ok per MD. 

## 2011-10-05 NOTE — Patient Instructions (Signed)
Take all new medications as prescribed Continue all other medications as before Your refill was done as requested You can also take Mucinex (or it's generic off brand) for congestion and ear fullness

## 2011-10-06 ENCOUNTER — Encounter: Payer: Self-pay | Admitting: Internal Medicine

## 2011-10-06 DIAGNOSIS — K219 Gastro-esophageal reflux disease without esophagitis: Secondary | ICD-10-CM | POA: Insufficient documentation

## 2011-10-06 HISTORY — DX: Gastro-esophageal reflux disease without esophagitis: K21.9

## 2011-10-06 NOTE — Progress Notes (Signed)
Subjective:    Patient ID: Chelsey Sanford, female    DOB: 1975/07/17, 36 y.o.   MRN: [id number]  HPI  Here with 3 days onset fever, HA, left ear pain without d/c or hearing loss, but with mild vertigo as well with position change.  Pt denies chest pain, increased sob or doe, wheezing, orthopnea, PND, increased LE swelling, palpitations, dizziness or syncope.   Pt denies polydipsia, polyuria.  Pt denies new neurological symptoms such as new headache, or facial or extremity weakness or numbness  Does have several wks ongoing nasal allergy symptoms with clear congestion, itch and sneeze, without fever, pain, ST, cough or wheezing.  Denies worsening reflux, dysphagia, abd pain, n/v, bowel change or blood but needs protonix refill.  Has hx of dysautonomia but this vertigo different, and IVF's yesterday at home no help.   Past Medical History  Diagnosis Date  . ALLERGIC RHINITIS   . ANEMIA-NOS   . ENDOMETRIOSIS   . Lumbar disc disease   . DYSAUTONOMIA   . Headache   . Irritable bowel syndrome   . Anxiety   . Delayed gastric emptying     dx at [facility] by gastric emptying study per patient   Past Surgical History  Procedure Date  . Ankle reconstruction 1993  . Tonsillectomy 1996  . Wisdom tooth extraction     reports that she quit smoking about 15 years ago. She has never used smokeless tobacco. She reports that she drinks alcohol. She reports that she does not use illicit drugs. family history includes Colon polyps in her mother; Crohn's disease in her maternal grandmother; Migraines in her mother; Skin cancer in her mother; and Thyroid disease in her mother.  There is no history of COPD and Colon cancer. Allergies  Allergen Reactions  . Amoxicillin   . Erythromycin   . Penicillins   . Sulfonamide Derivatives    Current Outpatient Prescriptions on File Prior to Visit  Medication Sig Dispense Refill  . citalopram (CELEXA) 20 MG tablet TAKE 1 TABLET BY MOUTH EVERY DAY  90 tablet  1  .  cyanocobalamin (,VITAMIN B-12,) 1000 MCG/ML injection 1 injection montly       . docusate sodium (COLACE) 100 MG capsule Take 100 mg by mouth daily.       . drospirenone-ethinyl estradiol (YAZ) 3-0.02 MG per tablet Take 1 tablet by mouth daily.        . fludrocortisone (FLORINEF) 0.1 MG tablet Take 0.05-0.1 mg by mouth See admin instructions. Takes 1 tablet in the morning and 1/2 tablet in the afternoon      . iron polysaccharides (NU-IRON) 150 MG capsule Take 150 mg by mouth daily.        . magnesium oxide (MAG-OX) 400 MG tablet Take 400 mg by mouth daily.      . MESTINON 180 MG CR tablet TAKE 1 TABLET BY MOUTH DAILY  30 tablet  2  . pantoprazole (PROTONIX) 40 MG tablet Take 1 tablet (40 mg total) by mouth 2 (two) times daily.  180 tablet  3  . pindolol (VISKEN) 5 MG tablet Take 5 mg by mouth daily.      . Probiotic Product (ALIGN) 4 MG CAPS Take by mouth daily.      . sodium chloride (CURITY STERILE SALINE) 0.9 % irrigation Irrigate with as directed. Once weekly      . triamcinolone (NASACORT AQ) 55 MCG/ACT nasal inhaler Place 2 sprays into the nose daily.  1 Inhaler  12  Review of Systems Review of Systems  Constitutional: Negative for diaphoresis and unexpected weight change.  HENT: Negative for drooling and tinnitus.   Eyes: Negative for photophobia and visual disturbance.  Respiratory: Negative for choking and stridor.   Gastrointestinal: Negative for vomiting and blood in stool.  Genitourinary: Negative for hematuria and decreased urine volume.  Musculoskeletal: Negative for gait problem.  Skin: Negative for color change and wound.    Objective:   Physical Exam BP 112/80  Pulse 76  Temp(Src) 99.1 F (37.3 C) (Oral)  Ht 5\' 6"  (1.676 m)  Wt 164 lb (74.39 kg)  BMI 26.47 kg/m2  SpO2 96% Physical Exam  VS noted Constitutional: Pt appears well-developed and well-nourished.  HENT: Head: Normocephalic.  Right Ear: External ear normal.  Left Ear: External ear normal.  Bilat tm's  mild erythema, left much more than right with mild bulge.  Sinus nontender.  Pharynx mild erythema Eyes: Conjunctivae and EOM are normal. Pupils are equal, round, and reactive to light.  Neck: Normal range of motion. Neck supple.  Cardiovascular: Normal rate and regular rhythm.   Pulmonary/Chest: Effort normal and breath sounds normal.  Neurological: Pt is alert. Not confused Skin: Skin is warm. No erythema.  Psychiatric: Pt behavior is normal. Thought content normal. 1+ nervous    Assessment & Plan:

## 2011-10-06 NOTE — Assessment & Plan Note (Signed)
stable overall by hx and exam, most recent data reviewed with pt, and pt to continue medical treatment as before, for med refill

## 2011-10-06 NOTE — Assessment & Plan Note (Signed)
Mild, likely related to left middle ear, for meclizine prn,  to f/u any worsening symptoms or concerns

## 2011-10-06 NOTE — Assessment & Plan Note (Signed)
Mild to mod, for antibx course,  to f/u any worsening symptoms or concerns 

## 2011-10-06 NOTE — Assessment & Plan Note (Signed)
Ok for [employer],  to f/u any worsening symptoms or concerns

## 2011-11-16 ENCOUNTER — Other Ambulatory Visit: Payer: Self-pay

## 2011-11-16 MED ORDER — PINDOLOL 5 MG PO TABS: 5.0000 mg | ORAL_TABLET | Freq: Every day | ORAL | Status: DC

## 2011-11-16 MED ORDER — CYANOCOBALAMIN 1000 MCG/ML IJ SOLN: 1000.0000 ug | INTRAMUSCULAR | Status: DC

## 2011-11-16 MED ORDER — MAGNESIUM OXIDE 400 MG PO TABS: 400.0000 mg | ORAL_TABLET | Freq: Every day | ORAL | Status: DC

## 2011-12-13 ENCOUNTER — Encounter: Payer: Self-pay | Admitting: Internal Medicine

## 2011-12-13 ENCOUNTER — Ambulatory Visit (INDEPENDENT_AMBULATORY_CARE_PROVIDER_SITE_OTHER): Payer: BC Managed Care – PPO | Admitting: Internal Medicine

## 2011-12-13 VITALS — BP 124/86 | HR 84 | Temp 98.2°F

## 2011-12-13 DIAGNOSIS — R51 Headache: Secondary | ICD-10-CM

## 2011-12-13 DIAGNOSIS — H6980 Other specified disorders of Eustachian tube, unspecified ear: Secondary | ICD-10-CM

## 2011-12-13 DIAGNOSIS — J329 Chronic sinusitis, unspecified: Secondary | ICD-10-CM

## 2011-12-13 DIAGNOSIS — Q078 Other specified congenital malformations of nervous system: Secondary | ICD-10-CM

## 2011-12-13 MED ORDER — PREDNISONE (PAK) 10 MG PO TABS: 10.0000 mg | ORAL_TABLET | ORAL | Status: AC

## 2011-12-13 MED ORDER — BUTALBITAL-APAP-CAFFEINE 50-325-40 MG PO TABS: 1.0000 | ORAL_TABLET | Freq: Two times a day (BID) | ORAL | Status: AC | PRN

## 2011-12-13 MED ORDER — LEVOFLOXACIN 500 MG PO TABS: 500.0000 mg | ORAL_TABLET | Freq: Every day | ORAL | Status: AC

## 2011-12-13 NOTE — Patient Instructions (Signed)
It was good to see you today. Levaquin for infection, pred taper for inflammation and Esgic as needed for headache symptoms - Your prescription(s) have been submitted to your pharmacy. Please take as directed and contact our office if you believe you are having problem(s) with the medication(s). Hydrate! Rest!

## 2011-12-13 NOTE — Progress Notes (Signed)
  Subjective:    Patient ID: Chelsey Sanford, female    DOB: Dec 06, 1975, 36 y.o.   MRN: [id number]  HPI See CC above Increase in sinus headache, different than usual migraines Not responding to usual tx for allergy and dysautonomia - increasing fatigue Planning HH visit for IVF to help hydration    Past Medical History  Diagnosis Date  . ALLERGIC RHINITIS   . ANEMIA-NOS   . ENDOMETRIOSIS   . Lumbar disc disease   . DYSAUTONOMIA   . Headache   . Irritable bowel syndrome   . Anxiety   . Delayed gastric emptying     dx at [facility] by gastric emptying study per patient  . GERD (gastroesophageal reflux disease) 10/06/2011    Review of Systems  Constitutional: Positive for chills and fatigue. Negative for fever.  HENT: Positive for ear pain (L), congestion, postnasal drip, sinus pressure and tinnitus. Negative for rhinorrhea and ear discharge.   Eyes: Negative for pain and redness.  Respiratory: Positive for cough.   Neurological: Positive for headaches. Negative for seizures and facial asymmetry.       Objective:   Physical Exam BP 124/86  Pulse 84  Temp 98.2 F (36.8 C) (Oral)  SpO2 98%  LMP 12/12/2011 Wt Readings from Last 3 Encounters:  10/05/11 164 lb (74.39 kg)  09/14/11 165 lb (74.844 kg)  06/08/11 162 lb (73.483 kg)   Constitutional: She appears well-developed and well-nourished. No distress.  HENT: Head: Normocephalic and atraumatic. Sinus tenderness and pressure to palpation. Ears: R TM ok but L TM hazy, no erythema or effusion; Nose: Nose normal. Mouth/Throat: Oropharynx is clear and moist. No oropharyngeal exudate.  Eyes: Conjunctivae and EOM are normal. Pupils are equal, round, and reactive to light. No scleral icterus.  Neck: Normal range of motion. Neck supple. No JVD present. No thyromegaly present.  Cardiovascular: Normal rate, regular rhythm and normal heart sounds.  No murmur heard. No BLE edema. Pulmonary/Chest: Effort normal and breath sounds normal. No  respiratory distress. She has no wheezes.  Neurological: She is alert and oriented to person, place, and time. No cranial nerve deficit. Coordination normal.  Skin: Skin is warm and dry. No rash noted. No erythema.  Psychiatric: She has a normal mood and affect. Her behavior is normal. Judgment and thought content normal.   Lab Results  Component Value Date   WBC 8.7 09/14/2011   HGB 13.3 09/14/2011   HCT 39.4 09/14/2011   PLT 255.0 09/14/2011   GLUCOSE 88 08/02/2011   ALT 32 01/14/2007   AST 22 01/14/2007   NA 140 08/02/2011   K 3.9 08/02/2011   CL 106 08/02/2011   CREATININE 1.00 08/02/2011   BUN 11 08/02/2011   CO2 27 07/31/2010   TSH 2.85 01/14/2007   INR 0.9 09/14/2011       Assessment & Plan:   sinus headache Acute on chronic sinusitis eustachian tube dysfunction Migraine headaches, hx Dysautonomia  Levaquin qd x 7d pred taper for inflammation Esgic prn headache pain Hydration - home IVF as per standing order

## 2012-01-02 ENCOUNTER — Ambulatory Visit (INDEPENDENT_AMBULATORY_CARE_PROVIDER_SITE_OTHER): Payer: BC Managed Care – PPO | Admitting: Internal Medicine

## 2012-01-02 ENCOUNTER — Telehealth: Payer: Self-pay | Admitting: *Deleted

## 2012-01-02 ENCOUNTER — Other Ambulatory Visit: Payer: Self-pay | Admitting: Internal Medicine

## 2012-01-02 ENCOUNTER — Encounter: Payer: Self-pay | Admitting: Internal Medicine

## 2012-01-02 ENCOUNTER — Ambulatory Visit
Admission: RE | Admit: 2012-01-02 | Discharge: 2012-01-02 | Disposition: A | Discharge status: Home / Self Care | Payer: BC Managed Care – PPO | Source: Ambulatory Visit | Attending: Internal Medicine | Admitting: Internal Medicine

## 2012-01-02 VITALS — BP 102/82 | HR 72 | Temp 98.3°F | Ht 66.0 in | Wt 167.1 lb

## 2012-01-02 DIAGNOSIS — G43909 Migraine, unspecified, not intractable, without status migrainosus: Secondary | ICD-10-CM

## 2012-01-02 DIAGNOSIS — J309 Allergic rhinitis, unspecified: Secondary | ICD-10-CM

## 2012-01-02 MED ORDER — FEXOFENADINE HCL 180 MG PO TABS: 180.0000 mg | ORAL_TABLET | Freq: Every day | ORAL | Status: DC

## 2012-01-02 MED ORDER — SUMATRIPTAN-NAPROXEN SODIUM 85-500 MG PO TABS: 1.0000 | ORAL_TABLET | ORAL | Status: DC | PRN

## 2012-01-02 NOTE — Telephone Encounter (Signed)
Received fax pt need PA on treximet. Contacted insurance fax over PA form place on md desk for completion...Raechel Chute

## 2012-01-02 NOTE — Progress Notes (Signed)
Subjective:    Patient ID: Chelsey Sanford, female    DOB: [birth year], 36 y.o.   MRN: [id number]  Migraine  This is a chronic problem. The current episode started in the past 7 days (chronic symptoms, worse in past 1 month, esp last 3 days). The problem occurs daily. The problem has been unchanged. The pain is located in the left unilateral and frontal region. The pain radiates to the left neck, left shoulder and face. The quality of the pain is described as throbbing, stabbing and aching. The pain is moderate. Associated symptoms include coughing, ear pain (L), nausea, phonophobia, photophobia, sinus pressure, tinnitus and a visual change. Pertinent negatives include no anorexia, back pain, blurred vision, dizziness, drainage, eye pain, eye redness, eye watering, facial sweating, fever, hearing loss, insomnia, loss of balance, rhinorrhea, seizures, sore throat, swollen glands, vomiting, weakness or weight loss. The symptoms are aggravated by activity, fatigue, emotional stress and menstrual cycle. She has tried NSAIDs (esgic) for the symptoms. The treatment provided moderate relief. Her past medical history is significant for migraine headaches and sinus disease. There is no history of cluster headaches, hypertension, pseudotumor cerebri or recent head traumas.     Past Medical History  Diagnosis Date  . ALLERGIC RHINITIS   . ANEMIA-NOS   . ENDOMETRIOSIS   . Lumbar disc disease   . DYSAUTONOMIA   . Headache   . Irritable bowel syndrome   . Anxiety   . Delayed gastric emptying     dx at [facility] by gastric emptying study per patient  . GERD (gastroesophageal reflux disease) 10/06/2011    Review of Systems  Constitutional: Positive for chills and fatigue. Negative for fever and weight loss.  HENT: Positive for ear pain (L), congestion, postnasal drip, sinus pressure and tinnitus. Negative for hearing loss, sore throat, rhinorrhea and ear discharge.   Eyes: Positive for photophobia. Negative for  blurred vision, pain and redness.  Respiratory: Positive for cough.   Gastrointestinal: Positive for nausea. Negative for vomiting and anorexia.  Musculoskeletal: Negative for back pain.  Neurological: Positive for headaches. Negative for dizziness, seizures, facial asymmetry, weakness and loss of balance.  Psychiatric/Behavioral: The patient does not have insomnia.        Objective:   Physical Exam  BP 102/82  Pulse 72  Temp 98.3 F (36.8 C) (Oral)  Ht 5\' 6"  (1.676 m)  Wt 167 lb 1.9 oz (75.805 kg)  BMI 26.97 kg/m2  SpO2 98%  LMP 12/12/2011 Wt Readings from Last 3 Encounters:  01/02/12 167 lb 1.9 oz (75.805 kg)  10/05/11 164 lb (74.39 kg)  09/14/11 165 lb (74.844 kg)   Constitutional: She appears well-developed and well-nourished. No distress.  HENT: Head: Normocephalic and atraumatic. Sinus tenderness and pressure to palpation. Ears: R TM ok but L TM hazy, no erythema or effusion; Nose: Nose normal. Mouth/Throat: Oropharynx is clear and moist. No oropharyngeal exudate.  Eyes: Conjunctivae and EOM are normal. Pupils are equal, round, and reactive to light. No scleral icterus.  Neck: Normal range of motion. Neck supple. No JVD or LAD present. No thyromegaly present.  Cardiovascular: Normal rate, regular rhythm and normal heart sounds.  No murmur heard. No BLE edema. Pulmonary/Chest: Effort normal and breath sounds normal. No respiratory distress. She has no wheezes.  Neurological: She is alert and oriented to person, place, and time. No cranial nerve deficit. MAE without deficit. Coordination, balance normal. Speech and recall normal Skin: Skin is warm and dry. No rash noted. No erythema.  Psychiatric:  She has a normal mood and affect. Her behavior is normal. Judgment and thought content normal.   Lab Results  Component Value Date   WBC 8.7 09/14/2011   HGB 13.3 09/14/2011   HCT 39.4 09/14/2011   PLT 255.0 09/14/2011   GLUCOSE 88 08/02/2011   ALT 32 01/14/2007   AST 22 01/14/2007     NA 140 08/02/2011   K 3.9 08/02/2011   CL 106 08/02/2011   CREATININE 1.00 08/02/2011   BUN 11 08/02/2011   CO2 27 07/31/2010   TSH 2.85 01/14/2007   INR 0.9 09/14/2011       Assessment & Plan:   See problem list. Medications and labs reviewed today.

## 2012-01-02 NOTE — Assessment & Plan Note (Signed)
Chronic symptoms - worse in past weeks, esp in past 3 days Exam benign ?overlap with chronic sinusitis Check MRI brain given acute increase/change in symptoms - Add NSAID/triptan combo in addition to prn Esgic (which provides temporary relief) Consider ENT and/or neuro input for symptoms mgmt depending on response to meds and imaging results

## 2012-01-02 NOTE — Assessment & Plan Note (Signed)
chronic allergic sinusitis symptoms - Unable to tolerate decongestant due to dysautonomia (POTS) Ok for "plain" antihistamine with ongoing nasal steroid

## 2012-01-02 NOTE — Patient Instructions (Signed)
It was good to see you today. we'll make referral for MRI brain . Our office will contact you regarding appointment(s) once made. Your results will be called to you after review (48-72hours after test completion). If any changes or referrals need to be made, you will be notified at that time. Start treximet as needed for migraine headache symptoms - ok to use with Esgic if/as needed for headache symptoms -  Also use "plain" Allegra for allergy and sinus symptoms in addition to nasal steroid spray Your prescription(s) have been submitted to your pharmacy. Please take as directed and contact our office if you believe you are having problem(s) with the medication(s). Migraine Headache A migraine headache is an intense, throbbing pain on one or both sides of your head. The exact cause of a migraine headache is not always known. A migraine may be caused when nerves in the brain become irritated and release chemicals that cause swelling within blood vessels, causing pain. Many migraine sufferers have a family history of migraines. Before you get a migraine you may or may not get an aura. An aura is a group of symptoms that can predict the beginning of a migraine. An aura may include:  Visual changes such as:   Flashing lights.   Bright spots or zig-zag lines.   Tunnel vision.   Feelings of numbness.   Trouble talking.   Muscle weakness.  SYMPTOMS  Pain on one or both sides of your head.   Pain that is pulsating or throbbing in nature.   Pain that is severe enough to prevent daily activities.   Pain that is aggravated by any daily physical activity.   Nausea (feeling sick to your stomach), vomiting, or both.   Pain with exposure to bright lights, loud noises, or activity.   General sensitivity to bright lights or loud noises.  MIGRAINE TRIGGERS Examples of triggers of migraine headaches include:    Alcohol.   Smoking.   Stress.   It may be related to menses (female  menstruation).   Aged cheeses.   Foods or drinks that contain nitrates, glutamate, aspartame, or tyramine.   Lack of sleep.   Chocolate.   Caffeine.   Hunger.   Medications such as nitroglycerine (used to treat chest pain), birth control pills, estrogen, and some blood pressure medications.  DIAGNOSIS   A migraine headache is often diagnosed based on:  Symptoms.   Physical examination.   A computerized X-ray scan (computed tomography, CT) of your head.  TREATMENT  Medications can help prevent migraines if they are recurrent or should they become recurrent. Your caregiver can help you with a medication or treatment program that will be helpful to you.   Lying down in a dark, quiet room may be helpful.   Keeping a headache diary may help you find a trend as to what may be triggering your headaches.  SEEK IMMEDIATE MEDICAL CARE IF:    You have confusion, personality changes or seizures.   You have headaches that wake you from sleep.   You have an increased frequency in your headaches.   You have a stiff neck.   You have a loss of vision.   You have muscle weakness.   You start losing your balance or have trouble walking.   You feel faint or pass out.  MAKE SURE YOU:    Understand these instructions.   Will watch your condition.   Will get help right away if you are not doing well or  get worse.  Document Released: 04/16/2005 Document Revised: 04/05/2011 Document Reviewed: 11/30/2008 [facility] Patient Information 2012 [place], [place].

## 2012-01-03 NOTE — Telephone Encounter (Signed)
Faxed Pa back this am received approval fax back. Notified pharmacy spoke with Bronson Curb gave approval status...Raechel Chute

## 2012-01-31 ENCOUNTER — Other Ambulatory Visit: Payer: Self-pay | Admitting: Internal Medicine

## 2012-03-12 ENCOUNTER — Other Ambulatory Visit: Payer: Self-pay | Admitting: Internal Medicine

## 2012-03-13 NOTE — Telephone Encounter (Signed)
Refill not appropriate

## 2012-04-03 ENCOUNTER — Other Ambulatory Visit: Payer: Self-pay | Admitting: Internal Medicine

## 2012-04-07 ENCOUNTER — Ambulatory Visit (INDEPENDENT_AMBULATORY_CARE_PROVIDER_SITE_OTHER): Payer: BC Managed Care – PPO | Admitting: Internal Medicine

## 2012-04-07 ENCOUNTER — Encounter: Payer: Self-pay | Admitting: Internal Medicine

## 2012-04-07 VITALS — BP 110/82 | HR 85 | Temp 97.0°F

## 2012-04-07 DIAGNOSIS — J019 Acute sinusitis, unspecified: Secondary | ICD-10-CM

## 2012-04-07 DIAGNOSIS — H5440 Blindness, one eye, unspecified eye: Secondary | ICD-10-CM

## 2012-04-07 NOTE — Progress Notes (Signed)
HPI  Pt presents to the clinic today with c/o new onset right eye blindness that lasted for 30 minutes this morning and has since resolved. She is scared. She was diagnosed with a sinus infection on Saturday and is currently being treated with Levaquin. X 7 days. This morning she vomited 3 times and within an hour noticed that she could not see anything out of her right eye. She has never had a problem with transient eye blindness before. She is very concerned.  Review of Systems    Past Medical History  Diagnosis Date  . ALLERGIC RHINITIS   . ANEMIA-NOS   . ENDOMETRIOSIS   . Lumbar disc disease   . DYSAUTONOMIA   . Headache   . Irritable bowel syndrome   . Anxiety   . Delayed gastric emptying     dx at [facility] by gastric emptying study per patient  . GERD (gastroesophageal reflux disease) 10/06/2011    Family History  Problem Relation Age of Onset  . Thyroid disease Mother     hypothyroidism  . Migraines Mother   . COPD Neg Hx   . Colon cancer Neg Hx   . Colon polyps Mother   . Skin cancer Mother   . Crohn's disease Maternal Grandmother     History   Social History  . Marital Status: Married    Spouse Name: N/A    Number of Children: 1  . Years of Education: N/A   Occupational History  . Property Management    Social History Main Topics  . Smoking status: Former Smoker    Quit date: 04/30/1996  . Smokeless tobacco: Never Used  . Alcohol Use: Yes     Comment: Occasional wine  . Drug Use: No  . Sexually Active: Yes    Birth Control/ Protection: Pill   Other Topics Concern  . 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 Amoxicillin   . Erythromycin   . Penicillins   . Sulfonamide Derivatives      Constitutional: Positive headache,  fatigue and fever. Denies abrupt weight changes.  HEENT:  Positive eye pain, pressure behind the eyes, facial pain, nasal congestion and sore throat. Transient right eye blindness which has resolved. Denies eye redness, ear pain, ringing in the ears, wax buildup, runny nose or bloody nose. Respiratory: Positive cough and thick green sputum production. Denies difficulty breathing or shortness of breath.  Cardiovascular: Denies chest pain, chest tightness, palpitations or swelling in the hands or feet.   No other specific complaints in a complete review of systems (except as listed in HPI above).  Objective:    General: Appears her stated age, well developed, well nourished in NAD. HEENT: Head: normal shape and size; Eyes: sclera white, no icterus, conjunctiva pink, PERRLA and EOMs intact; Ears: Tm's gray and intact, normal light reflex; Nose: mucosa pink and moist, septum midline; Throat/Mouth: + PND. Teeth present, mucosa pink and moist, no exudate noted, no lesions or ulcerations noted.  Neck: No cervical lymphadenopathy. Neck supple, trachea midline. No massses, lumps or thyromegaly present.  Cardiovascular: Normal rate and rhythm. S1,S2 noted.  No murmur, rubs or gallops noted. No JVD or BLE edema. No carotid bruits noted. Pulmonary/Chest: Normal effort and positive vesicular breath sounds. No respiratory distress. No wheezes,  rales or ronchi noted.      Assessment & Plan:   Acute bacterial sinusitis with transient eye blindness in the right eye  Continue antibiotics as prescribed Will obtain CT scan of head to r/o intracranial process  RTC as needed or if symptoms persist.

## 2012-04-07 NOTE — Patient Instructions (Addendum)

## 2012-04-09 ENCOUNTER — Encounter: Payer: Self-pay | Admitting: Internal Medicine

## 2012-04-09 ENCOUNTER — Telehealth: Payer: Self-pay | Admitting: Internal Medicine

## 2012-04-09 ENCOUNTER — Ambulatory Visit (INDEPENDENT_AMBULATORY_CARE_PROVIDER_SITE_OTHER)
Admission: RE | Admit: 2012-04-09 | Discharge: 2012-04-09 | Disposition: A | Discharge status: Home / Self Care | Payer: BC Managed Care – PPO | Source: Ambulatory Visit | Attending: Internal Medicine | Admitting: Internal Medicine

## 2012-04-09 DIAGNOSIS — H5440 Blindness, one eye, unspecified eye: Secondary | ICD-10-CM

## 2012-04-09 MED ORDER — AZITHROMYCIN 250 MG PO TABS: ORAL_TABLET | ORAL | Status: DC

## 2012-04-09 NOTE — Telephone Encounter (Signed)
Notified pt with md response.../lmb 

## 2012-04-09 NOTE — Telephone Encounter (Signed)
Try Zpak as pt has done well with this in past - erx done

## 2012-04-09 NOTE — Telephone Encounter (Signed)
Pt states she was seen on 04/05/12 at an Urgent Care and diagnosed with fluid in ear and sinusitis.  Pt was put on Levaquin x 5days.  Pt reports that today (12/11) is the fifth day and she does not feel any better.  Pt reports she still has a low grade fever (100.2) and she still has a headache/sinus pain.  Pt wanted to know if new medication can be called in for her.  Pt was also seen in the office on 04/07/12 for vision problems.  OFFICE PLEASE FOLLOW UP WITH PATIENT IF ANOTHER ANTIBIOTIC CAN BE CALLED IN FOR PATIENT.  (pt uses [facility] off [address])

## 2012-04-10 ENCOUNTER — Telehealth: Payer: Self-pay | Admitting: *Deleted

## 2012-04-10 NOTE — Telephone Encounter (Signed)
Pt informed of results via VM and to callback office for any questions/concerns.    Ash,  Can you please send this to Mrs. Linville, and call to let her know that the CT was normal. The symptoms she was experiencing were likely due to sinus pressure. Thx!  Rene Kocher

## 2012-04-21 ENCOUNTER — Other Ambulatory Visit: Payer: Self-pay | Admitting: Internal Medicine

## 2012-04-25 ENCOUNTER — Other Ambulatory Visit: Payer: Self-pay | Admitting: Internal Medicine

## 2012-05-01 ENCOUNTER — Other Ambulatory Visit: Payer: Self-pay | Admitting: *Deleted

## 2012-05-01 MED ORDER — PINDOLOL 5 MG PO TABS: 5.0000 mg | ORAL_TABLET | Freq: Every day | ORAL | Status: DC

## 2012-05-08 ENCOUNTER — Other Ambulatory Visit: Payer: Self-pay | Admitting: *Deleted

## 2012-05-08 MED ORDER — PYRIDOSTIGMINE BROMIDE ER 180 MG PO TBCR: 180.0000 mg | EXTENDED_RELEASE_TABLET | Freq: Every day | ORAL | Status: DC

## 2012-05-26 ENCOUNTER — Other Ambulatory Visit: Payer: Self-pay | Admitting: Internal Medicine

## 2012-05-29 NOTE — Telephone Encounter (Signed)
Get refill from PCP

## 2012-06-04 ENCOUNTER — Other Ambulatory Visit: Payer: Self-pay | Admitting: *Deleted

## 2012-06-04 MED ORDER — CYANOCOBALAMIN 1000 MCG/ML IJ SOLN: 1000.0000 ug | INTRAMUSCULAR | Status: DC

## 2012-06-09 ENCOUNTER — Encounter (HOSPITAL_COMMUNITY): Payer: Self-pay | Admitting: Emergency Medicine

## 2012-06-09 ENCOUNTER — Emergency Department (HOSPITAL_COMMUNITY)
Admission: EM | Admit: 2012-06-09 | Discharge: 2012-06-09 | Disposition: A | Discharge status: Home / Self Care | Payer: BC Managed Care – PPO | Attending: Emergency Medicine | Admitting: Emergency Medicine

## 2012-06-09 DIAGNOSIS — IMO0002 Reserved for concepts with insufficient information to code with codable children: Secondary | ICD-10-CM | POA: Insufficient documentation

## 2012-06-09 DIAGNOSIS — Z8709 Personal history of other diseases of the respiratory system: Secondary | ICD-10-CM | POA: Insufficient documentation

## 2012-06-09 DIAGNOSIS — Z8659 Personal history of other mental and behavioral disorders: Secondary | ICD-10-CM | POA: Insufficient documentation

## 2012-06-09 DIAGNOSIS — Z8679 Personal history of other diseases of the circulatory system: Secondary | ICD-10-CM | POA: Insufficient documentation

## 2012-06-09 DIAGNOSIS — M5416 Radiculopathy, lumbar region: Secondary | ICD-10-CM

## 2012-06-09 DIAGNOSIS — Z862 Personal history of diseases of the blood and blood-forming organs and certain disorders involving the immune mechanism: Secondary | ICD-10-CM | POA: Insufficient documentation

## 2012-06-09 DIAGNOSIS — Z87891 Personal history of nicotine dependence: Secondary | ICD-10-CM | POA: Insufficient documentation

## 2012-06-09 DIAGNOSIS — Z8742 Personal history of other diseases of the female genital tract: Secondary | ICD-10-CM | POA: Insufficient documentation

## 2012-06-09 DIAGNOSIS — R209 Unspecified disturbances of skin sensation: Secondary | ICD-10-CM | POA: Insufficient documentation

## 2012-06-09 DIAGNOSIS — Z8719 Personal history of other diseases of the digestive system: Secondary | ICD-10-CM | POA: Insufficient documentation

## 2012-06-09 DIAGNOSIS — Z8739 Personal history of other diseases of the musculoskeletal system and connective tissue: Secondary | ICD-10-CM | POA: Insufficient documentation

## 2012-06-09 DIAGNOSIS — Z8669 Personal history of other diseases of the nervous system and sense organs: Secondary | ICD-10-CM | POA: Insufficient documentation

## 2012-06-09 DIAGNOSIS — Z79899 Other long term (current) drug therapy: Secondary | ICD-10-CM | POA: Insufficient documentation

## 2012-06-09 HISTORY — DX: Tachycardia, unspecified: R00.0

## 2012-06-09 HISTORY — DX: Postural orthostatic tachycardia syndrome (POTS): G90.A

## 2012-06-09 HISTORY — DX: Other specified cardiac arrhythmias: I49.8

## 2012-06-09 HISTORY — DX: Orthostatic hypotension: I95.1

## 2012-06-09 MED ORDER — HYDROCODONE-ACETAMINOPHEN 5-325 MG PO TABS: 1.0000 | ORAL_TABLET | ORAL | Status: DC | PRN

## 2012-06-09 MED ORDER — IBUPROFEN 400 MG PO TABS
600.0000 mg | ORAL_TABLET | Freq: Once | ORAL | Status: AC
Start: 2012-06-09 — End: 2012-06-09
  Administered 2012-06-09: 600 mg via ORAL
  Filled 2012-06-09: qty 1

## 2012-06-09 MED ORDER — DIAZEPAM 2 MG PO TABS
2.0000 mg | ORAL_TABLET | Freq: Once | ORAL | Status: AC
  Administered 2012-06-09: 2 mg via ORAL
  Filled 2012-06-09: qty 1

## 2012-06-09 MED ORDER — PREDNISONE 50 MG PO TABS: ORAL_TABLET | ORAL | Status: DC

## 2012-06-09 MED ORDER — IBUPROFEN 600 MG PO TABS: 600.0000 mg | ORAL_TABLET | Freq: Four times a day (QID) | ORAL | Status: DC | PRN

## 2012-06-09 MED ORDER — HYDROMORPHONE HCL PF 1 MG/ML IJ SOLN
1.0000 mg | Freq: Once | INTRAMUSCULAR | Status: AC
  Administered 2012-06-09: 1 mg via INTRAMUSCULAR
  Filled 2012-06-09: qty 1

## 2012-06-09 MED ORDER — CYCLOBENZAPRINE HCL 10 MG PO TABS: 10.0000 mg | ORAL_TABLET | Freq: Two times a day (BID) | ORAL | Status: DC | PRN

## 2012-06-09 NOTE — ED Notes (Signed)
Pt c/o lower back pain with radiation down right leg starting today; pt denies injury and sts hx of same; pt sts painful to sit

## 2012-06-09 NOTE — ED Provider Notes (Signed)
History     This chart was scribed for Derwood Kaplan, MD, MD by Smitty Pluck, ED Scribe. The patient was seen in room TR11C/TR11C and the patient's care was started at 5:12 PM.   CSN: [id number]  Arrival date & time 06/09/12  1627   None     Chief Complaint  Patient presents with  . Back Pain     The history is provided by the patient. No language interpreter was used.   Chelsey Sanford is a 37 y.o. female who presents to the Emergency Department complaining of constant, severe sharp, lower back pain radiating to right left and foot onset today 2 hours ago. She reports sudden onset when she was sitting down on sofa. She mentions certain positions, sitting and movment aggravates the pain.  Pt reports that she has hx of L4-L5 back pain that radiates to right leg. She states that she takes flexeril and meloxicam for pain normally when she has flare up of back pain. She denies recent injury, lifting heavy objects, weakness in bilateral lower extremities, fever, chills, nausea, vomiting, diarrhea, cough, SOB and any other pain.   Past Medical History  Diagnosis Date  . ALLERGIC RHINITIS   . ANEMIA-NOS   . ENDOMETRIOSIS   . Lumbar disc disease   . DYSAUTONOMIA   . Headache   . Irritable bowel syndrome   . Anxiety   . Delayed gastric emptying     dx at [facility] by gastric emptying study per patient  . GERD (gastroesophageal reflux disease) 10/06/2011  . POTS (postural orthostatic tachycardia syndrome)     Past Surgical History  Procedure Laterality Date  . Ankle reconstruction  1993  . Tonsillectomy  1996  . Wisdom tooth extraction      Family History  Problem Relation Age of Onset  . Thyroid disease Mother     hypothyroidism  . Migraines Mother   . COPD Neg Hx   . Colon cancer Neg Hx   . Colon polyps Mother   . Skin cancer Mother   . Crohn's disease Maternal Grandmother     History  Substance Use Topics  . Smoking status: Former Smoker    Quit date: 04/30/1996  . Smokeless  tobacco: Never Used  . Alcohol Use: Yes     Comment: Occasional wine    OB History   Grav Para Term Preterm Abortions TAB SAB Ect Mult Living                  Review of Systems  Constitutional: Negative for chills.  Respiratory: Negative for cough and shortness of breath.   Gastrointestinal: Negative for nausea, vomiting and diarrhea.  Musculoskeletal: Positive for back pain.  Neurological: Positive for numbness.  All other systems reviewed and are negative.    Allergies  Sulfonamide derivatives; Amoxicillin; Erythromycin; and Penicillins  Home Medications   Current Outpatient Rx  Name  Route  Sig  Dispense  Refill  . azithromycin (ZITHROMAX Z-PAK) 250 MG tablet      Take 2 tablets (500 mg) on  Day 1,  followed by 1 tablet (250 mg) once daily on Days 2 through 5.   6 each   0   . butalbital-acetaminophen-caffeine (FIORICET WITH CODEINE) 50-325-40-30 MG per capsule   Oral   Take 2 capsules by mouth every 4 (four) hours as needed.         . citalopram (CELEXA) 20 MG tablet      TAKE 1 TABLET BY MOUTH EVERY DAY  90 tablet   1   . cyanocobalamin (,VITAMIN B-12,) 1000 MCG/ML injection   Intramuscular   Inject 1 mL (1,000 mcg total) into the muscle every 30 (thirty) days. 1 injection montly   1 mL   5   . docusate sodium (COLACE) 100 MG capsule   Oral   Take 100 mg by mouth daily.          . drospirenone-ethinyl estradiol (YAZ) 3-0.02 MG per tablet   Oral   Take 1 tablet by mouth daily.           . fexofenadine (ALLEGRA) 180 MG tablet   Oral   Take 1 tablet (180 mg total) by mouth daily.         . fludrocortisone (FLORINEF) 0.1 MG tablet   Oral   Take 0.05-0.1 mg by mouth See admin instructions. Takes 1 tablet in the morning and 1/2 tablet in the afternoon         . iron polysaccharides (NU-IRON) 150 MG capsule   Oral   Take 150 mg by mouth daily.           . magnesium oxide (MAG-OX) 400 MG tablet   Oral   Take 1 tablet (400 mg total) by  mouth daily.   30 tablet   5   . pantoprazole (PROTONIX) 40 MG tablet   Oral   Take 1 tablet (40 mg total) by mouth 2 (two) times daily.   180 tablet   3   . pantoprazole (PROTONIX) 40 MG tablet      TAKE 1 TABLET BY MOUTH DAILY   30 tablet   2     Yearly physical is due with labs no additional ref ...   . pindolol (VISKEN) 5 MG tablet   Oral   Take 1 tablet (5 mg total) by mouth daily.   30 tablet   5   . pindolol (VISKEN) 5 MG tablet      TAKE 1 TABLET BY MOUTH EVERY DAY   30 tablet   1     Overdue for yearly physical for additional refills   . Probiotic Product (ALIGN) 4 MG CAPS   Oral   Take by mouth daily.         Marland Kitchen pyridostigmine (MESTINON) 180 MG CR tablet   Oral   Take 1 tablet (180 mg total) by mouth daily.   30 tablet   6   . sodium chloride (CURITY STERILE SALINE) 0.9 % irrigation   Irrigation   Irrigate with as directed. Once weekly         . SUMAtriptan-naproxen (TREXIMET) 85-500 MG per tablet   Oral   Take 1 tablet by mouth every 2 (two) hours as needed for migraine.   10 tablet   0   . triamcinolone (NASACORT AQ) 55 MCG/ACT nasal inhaler   Nasal   Place 2 sprays into the nose daily.   1 Inhaler   12     BP 152/102  Pulse 85  Temp(Src) 98.1 F (36.7 C) (Oral)  Resp 16  SpO2 100%  Physical Exam  Nursing note and vitals reviewed. Constitutional: She is oriented to person, place, and time. She appears well-developed and well-nourished. No distress.  HENT:  Head: Normocephalic and atraumatic.  Eyes: EOM are normal.  Neck: Neck supple. No tracheal deviation present.  Cardiovascular: Normal rate, regular rhythm and normal heart sounds.   No murmur heard. Pulmonary/Chest: Effort normal and breath sounds normal. No respiratory distress.  She has no wheezes. She has no rales.  Musculoskeletal: Normal range of motion. She exhibits no edema.  No focal tenderness over lumbar spine upon palpation. No lateral tenderness over lower back  upon palpation. No step off.   Neurological: She is alert and oriented to person, place, and time.  Skin: Skin is warm and dry.  Psychiatric: She has a normal mood and affect. Her behavior is normal.    ED Course  Procedures (including critical care time) DIAGNOSTIC STUDIES: Oxygen Saturation is 100% on room air, normal by my interpretation.    COORDINATION OF CARE: 5:17 PM Discussed ED treatment with pt and pt agrees.     Labs Reviewed - No data to display No results found.   No diagnosis found.    MDM  I personally performed the services described in this documentation, which was scribed in my presence. The recorded information has been reviewed and is accurate.   Pt comes in with cc of back pain. Pt has hx of DJD of back - and her current exam is consistent with nerve impingement. Will give her a burst dose of steroids. Will give her some acute analgesics and muscle relaxants.    Derwood Kaplan, MD 06/09/12 1747

## 2012-06-09 NOTE — ED Notes (Signed)
Patient states she was trying to sit down on the sofa and she had intense sever pain that she describes as stabbing and her muscles became weak where she said she thought she was going to fall. She has a history of back pain.

## 2012-07-02 ENCOUNTER — Other Ambulatory Visit: Payer: Self-pay | Admitting: *Deleted

## 2012-07-02 MED ORDER — CITALOPRAM HYDROBROMIDE 20 MG PO TABS: 20.0000 mg | ORAL_TABLET | Freq: Every day | ORAL | Status: DC

## 2012-08-05 ENCOUNTER — Other Ambulatory Visit: Payer: Self-pay | Admitting: Internal Medicine

## 2012-08-12 ENCOUNTER — Other Ambulatory Visit: Payer: Self-pay | Admitting: Internal Medicine

## 2012-09-03 ENCOUNTER — Encounter: Payer: Self-pay | Admitting: Internal Medicine

## 2012-09-03 ENCOUNTER — Ambulatory Visit (INDEPENDENT_AMBULATORY_CARE_PROVIDER_SITE_OTHER): Payer: BC Managed Care – PPO | Admitting: Internal Medicine

## 2012-09-03 VITALS — BP 118/82 | HR 76 | Temp 98.2°F | Resp 16 | Wt 168.0 lb

## 2012-09-03 DIAGNOSIS — L259 Unspecified contact dermatitis, unspecified cause: Secondary | ICD-10-CM

## 2012-09-03 MED ORDER — HYDROXYZINE HCL 25 MG PO TABS: 25.0000 mg | ORAL_TABLET | Freq: Three times a day (TID) | ORAL | Status: DC | PRN

## 2012-09-03 MED ORDER — METHYLPREDNISOLONE ACETATE 80 MG/ML IJ SUSP
80.0000 mg | Freq: Once | INTRAMUSCULAR | Status: AC
  Administered 2012-09-03: 80 mg via INTRAMUSCULAR

## 2012-09-03 MED ORDER — TRIAMCINOLONE ACETONIDE 0.5 % EX CREA: TOPICAL_CREAM | Freq: Three times a day (TID) | CUTANEOUS | Status: DC

## 2012-09-03 NOTE — Assessment & Plan Note (Signed)
depomedrol 80 mg im Hydroxyzine prn Triamc cream

## 2012-09-04 ENCOUNTER — Encounter: Payer: Self-pay | Admitting: Internal Medicine

## 2012-09-04 NOTE — Progress Notes (Signed)
Subjective:     Patient ID: Chelsey Sanford, female   DOB: 1976-01-16, 37 y.o.   MRN: [id number]  Rash This is a new problem. The current episode started in the past 7 days. The problem has been gradually worsening since onset. The affected locations include the head, face, neck, torso, left arm and right arm. The rash is characterized by itchiness, redness and swelling. She was exposed to plant contact. Past treatments include anti-itch cream and antihistamine. The treatment provided mild relief. Her past medical history is significant for allergies.     Review of Systems  Skin: Positive for rash.       Objective:   Physical Exam  Constitutional: She appears well-developed. No distress.  HENT:  Head: Normocephalic.  Right Ear: External ear normal.  Left Ear: External ear normal.  Nose: Nose normal.  Mouth/Throat: Oropharynx is clear and moist.  Eyes: Conjunctivae are normal. Pupils are equal, round, and reactive to light. Right eye exhibits no discharge. Left eye exhibits no discharge.  Neck: Normal range of motion. Neck supple. No JVD present. No tracheal deviation present. No thyromegaly present.  Cardiovascular: Normal rate, regular rhythm and normal heart sounds.   Pulmonary/Chest: No stridor. No respiratory distress. She has no wheezes.  Abdominal: Soft. Bowel sounds are normal. She exhibits no distension and no mass. There is no tenderness. There is no rebound and no guarding.  Musculoskeletal: She exhibits no edema and no tenderness.  Lymphadenopathy:    She has no cervical adenopathy.  Neurological: She displays normal reflexes. No cranial nerve deficit. She exhibits normal muscle tone. Coordination normal.  Skin: Rash noted. No erythema.  Psychiatric: She has a normal mood and affect. Her behavior is normal. Judgment and thought content normal.       Assessment:        Plan:

## 2012-10-01 ENCOUNTER — Other Ambulatory Visit: Payer: Self-pay | Admitting: *Deleted

## 2012-10-01 MED ORDER — CITALOPRAM HYDROBROMIDE 20 MG PO TABS: 20.0000 mg | ORAL_TABLET | Freq: Every day | ORAL | Status: DC

## 2012-10-10 ENCOUNTER — Encounter: Payer: Self-pay | Admitting: Internal Medicine

## 2012-12-02 ENCOUNTER — Other Ambulatory Visit: Payer: Self-pay | Admitting: *Deleted

## 2012-12-02 MED ORDER — SODIUM CHLORIDE 0.9 % IR SOLN: 1000.0000 mL | Status: DC

## 2012-12-02 NOTE — Telephone Encounter (Signed)
Number left for [facility] [phone]

## 2012-12-02 NOTE — Telephone Encounter (Signed)
[facility] calling to receive refill for IV Sodium through Dr Tenny Craw.

## 2012-12-02 NOTE — Telephone Encounter (Signed)
I spoke with pt & she states she is using the iv sodium each weekend. States she is doing well. She is due for follow-up appointment with Dr. Tenny Craw.  Appointment made & card mailed to pt. Mylo Red RN

## 2012-12-09 ENCOUNTER — Telehealth: Payer: Self-pay | Admitting: Internal Medicine

## 2012-12-09 NOTE — Telephone Encounter (Signed)
New Prob  Coretta with [facility] is requesting a refill on normal saline liter bag.

## 2012-12-09 NOTE — Telephone Encounter (Signed)
Verbal ok given, will fax over order.

## 2012-12-16 ENCOUNTER — Telehealth: Payer: Self-pay | Admitting: Internal Medicine

## 2012-12-16 NOTE — Telephone Encounter (Signed)
New Problem  Needs recent office notes faxed for insurance purposes.

## 2012-12-16 NOTE — Telephone Encounter (Signed)
Message given to medical records.Medical Records will send to [facility].

## 2012-12-23 ENCOUNTER — Telehealth: Payer: Self-pay | Admitting: Internal Medicine

## 2012-12-23 NOTE — Telephone Encounter (Signed)
error 

## 2013-01-09 ENCOUNTER — Other Ambulatory Visit: Payer: Self-pay | Admitting: *Deleted

## 2013-01-09 MED ORDER — SUMATRIPTAN-NAPROXEN SODIUM 85-500 MG PO TABS: 1.0000 | ORAL_TABLET | ORAL | Status: DC | PRN

## 2013-01-09 NOTE — Telephone Encounter (Signed)
Left msg on vm requesting ca call back. Called pt back she stated she has notified the pharmacy. But she has been suffering with a migraine,and wanting med refill ASAP. Inform pt will send to pharmacy...lmb

## 2013-01-09 NOTE — Telephone Encounter (Signed)
Received fax pt needing PA on treximet. Completed PA on covermymeds. Waiting on approval status...lmb

## 2013-01-11 ENCOUNTER — Other Ambulatory Visit: Payer: Self-pay | Admitting: Internal Medicine

## 2013-01-12 NOTE — Telephone Encounter (Signed)
Received another questionaire for PA on treximent. Completed and fax back waiting on approval status...Raechel Chute

## 2013-01-13 NOTE — Telephone Encounter (Signed)
Received PA back med was denied. Called pt to inform status no answer LMOM RTC...lmb

## 2013-01-14 NOTE — Telephone Encounter (Signed)
Tried calling pt again still no answer LMOM reguarding denial on treximet...lmb

## 2013-01-16 ENCOUNTER — Ambulatory Visit (INDEPENDENT_AMBULATORY_CARE_PROVIDER_SITE_OTHER): Payer: BC Managed Care – PPO | Admitting: Internal Medicine

## 2013-01-16 VITALS — BP 132/82 | HR 69 | Ht 66.0 in | Wt 166.0 lb

## 2013-01-16 DIAGNOSIS — Q078 Other specified congenital malformations of nervous system: Secondary | ICD-10-CM

## 2013-01-16 LAB — BASIC METABOLIC PANEL
Calcium: 9.5 mg/dL (ref 8.4–10.5)
GFR: 101.08 mL/min (ref >60.00)
Glucose, Bld: 95 mg/dL (ref 70–99)
Potassium: 3.8 mEq/L (ref 3.5–5.1)
Sodium: 135 mEq/L (ref 135–145)

## 2013-01-16 LAB — MAGNESIUM: Magnesium: 1.9 mg/dL (ref 1.5–2.5)

## 2013-01-16 MED ORDER — PYRIDOSTIGMINE BROMIDE 60 MG PO TABS: ORAL_TABLET | ORAL | Status: DC

## 2013-01-16 MED ORDER — AZITHROMYCIN 250 MG PO TABS: ORAL_TABLET | ORAL | Status: DC

## 2013-01-16 NOTE — Patient Instructions (Addendum)
Lab Work today    Start Z Pak as directed    Decrease Mestinon to 60 mg twice a day   Your physician wants you to follow-up in: 12 months You will receive a reminder letter in the mail two months in advance. If you don't receive a letter, please call our office to schedule the follow-up appointment.

## 2013-01-16 NOTE — Progress Notes (Signed)
HPI Patient is a 37 year old with a history of autonomic dysfunction and POTS.  I saw her back in 2013 Since seen she says she has been doing fairly well.  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  Did stop florinef  No difference in how felt overall.  Has not restarted.  Stopped mestinon.  Felt foggy but felt her symptoms of cramping may have gotten begter. Since seen she continues to get IV fluids every couple weeks.  Suffering from sinus infection now.    Allergies  Allergen Reactions  . Sulfonamide Derivatives Nausea And Vomiting  . Amoxicillin Nausea And Vomiting and Rash  . Erythromycin Nausea And Vomiting and Rash  . Penicillins Nausea And Vomiting and Rash    Current Outpatient Prescriptions  Medication Sig Dispense Refill  . citalopram (CELEXA) 20 MG tablet Take 1 tablet (20 mg total) by mouth daily.  90 tablet  0  . cyanocobalamin (,VITAMIN B-12,) 1000 MCG/ML injection Inject 1,000 mcg into the muscle every 30 (thirty) days. 1st week of every month      . cyclobenzaprine (FLEXERIL) 10 MG tablet Take 1 tablet (10 mg total) by mouth 2 (two) times daily as needed for muscle spasms.  20 tablet  0  . docusate sodium (COLACE) 100 MG capsule Take 100 mg by mouth daily.       . drospirenone-ethinyl estradiol (YASMIN,ZARAH,SYEDA) 3-0.03 MG tablet Take 1 tablet by mouth every evening.      . Fludrocortisone Acetate (FLORINEF PO) Take by mouth. ON HOLD FOR 2 WEEKS  PER PT      . HYDROcodone-acetaminophen (NORCO/VICODIN) 5-325 MG per tablet Take 1 tablet by mouth every 4 (four) hours as needed for pain.  10 tablet  0  . ibuprofen (ADVIL,MOTRIN) 600 MG tablet Take 1 tablet (600 mg total) by mouth every 6 (six) hours as needed for pain.  30 tablet  0  . iron polysaccharides (NU-IRON) 150 MG capsule Take 150 mg by mouth daily.         . magnesium oxide (MAG-OX) 400 MG tablet Take 1 tablet (400 mg total) by mouth daily.  30 tablet  5  . meloxicam (MOBIC) 7.5 MG tablet Take 7.5 mg by mouth daily as needed for pain. For back pain      . MESTINON 180 MG CR tablet TAKE 1 TABLET BY MOUTH EVERY DAY  30 tablet  6  . pindolol (VISKEN) 5 MG tablet Take 1 tablet (5 mg total) by mouth daily.  30 tablet  5  . Probiotic Product (ALIGN) 4 MG CAPS Take 4 mg by mouth daily.       . pseudoephedrine (SUDAFED) 30 MG tablet Take 30 mg by mouth daily as needed for congestion. For nasal congestion      . sodium chloride irrigation (CURITY STERILE SALINE) 0.9 % irrigation Irrigate with 1,000-2,000 mLs as directed once a week. On Sundays - based on hydration levels  500 mL  11  . SUMAtriptan-naproxen (TREXIMET) 85-500 MG per tablet Take 1 tablet by mouth every 2 (two) hours as needed for migraine.  10 tablet  0  . triamcinolone (NASACORT AQ) 55 MCG/ACT nasal inhaler Place 2 sprays into the nose daily as needed. For stuffiness      . pantoprazole (PROTONIX) 40 MG tablet Take 1 tablet (  40 mg total) by mouth 2 (two) times daily.  180 tablet  3   No current facility-administered medications for this visit.    Past Medical History  Diagnosis Date  . ALLERGIC RHINITIS   . ANEMIA-NOS   . ENDOMETRIOSIS   . Lumbar disc disease   . DYSAUTONOMIA   . Headache(784.0)   . Irritable bowel syndrome   . Anxiety   . Delayed gastric emptying     dx at [facility] by gastric emptying study per patient  . GERD (gastroesophageal reflux disease) 10/06/2011  . POTS (postural orthostatic tachycardia syndrome)     Past Surgical History  Procedure Laterality Date  . Ankle reconstruction  1993  . Tonsillectomy  1996  . Wisdom tooth extraction      Family History  Problem Relation Age of Onset  . Thyroid disease Mother     hypothyroidism  . Migraines Mother   . COPD Neg Hx   . Colon cancer Neg Hx   . Colon polyps Mother   . Skin cancer Mother   . Crohn's  disease Maternal Grandmother     History   Social History  . Marital Status: Married    Spouse Name: N/A    Number of Children: 1  . Years of Education: N/A   Occupational History  . Property Management    Social History Main Topics  . Smoking status: Former Smoker    Quit date: 04/30/1996  . Smokeless tobacco: Never Used  . Alcohol Use: Yes     Comment: Occasional wine  . Drug Use: No  . Sexual Activity: Yes    Birth Control/ Protection: Pill   Other Topics Concern  . 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  They are negative to the above problem except as previously stated.  Vital Signs: BP 132/82  Pulse 69  Ht 5\' 6"  (1.676 m)  Wt 166 lb (75.297 kg)  BMI 26.81 kg/m2  Physical Exam Patient is in NAD.  HEENT:  Normocephalic, atraumatic. EOMI, PERRLA.  Neck: JVP is normal. No thyromegaly. No bruits.  Lungs: clear to auscultation. No rales no wheezes.  Heart: Regular rate and rhythm. Normal S1, S2. No S3.   No significant murmurs. PMI not displaced.  Abdomen:  Supple, nontender. Normal bowel sounds. No masses. No hepatomegaly.  Extremities:   Good distal pulses throughout. No lower extremity edema.  Musculoskeletal :moving all extremities.  Neuro:   alert and oriented x3.  CN II-XII grossly intact.   Assessment and Plan:  1.  Autonomic dysfunction.  The patient continues to remain very active  I think this has helped her.  She still has symptoms but they are not as severe as they were in the past. She has signif cramping  Stopped mestinon  Did not feel well but thought cramping got better.   I would recom slowly tapering  Move from extended release to immed release.  Follow cramping   Continue other meds  Get  flu shot  She continues on IV fluid every 2 wks  I would like to be able to taper this but she has not done well with going to every 3 wks.   2  Sinus problems  Rx for Z pac given.

## 2013-01-26 ENCOUNTER — Encounter: Payer: Self-pay | Admitting: Internal Medicine

## 2013-01-26 ENCOUNTER — Ambulatory Visit (INDEPENDENT_AMBULATORY_CARE_PROVIDER_SITE_OTHER): Payer: BC Managed Care – PPO | Admitting: Internal Medicine

## 2013-01-26 VITALS — BP 110/82 | HR 83 | Temp 98.6°F

## 2013-01-26 DIAGNOSIS — Q078 Other specified congenital malformations of nervous system: Secondary | ICD-10-CM

## 2013-01-26 DIAGNOSIS — J309 Allergic rhinitis, unspecified: Secondary | ICD-10-CM

## 2013-01-26 DIAGNOSIS — Z23 Encounter for immunization: Secondary | ICD-10-CM

## 2013-01-26 DIAGNOSIS — J329 Chronic sinusitis, unspecified: Secondary | ICD-10-CM

## 2013-01-26 DIAGNOSIS — J019 Acute sinusitis, unspecified: Secondary | ICD-10-CM

## 2013-01-26 MED ORDER — METHYLPREDNISOLONE ACETATE 80 MG/ML IJ SUSP
80.0000 mg | Freq: Once | INTRAMUSCULAR | Status: AC
  Administered 2013-01-26: 80 mg via INTRAMUSCULAR

## 2013-01-26 MED ORDER — LEVOFLOXACIN 500 MG PO TABS: 500.0000 mg | ORAL_TABLET | Freq: Every day | ORAL | Status: DC

## 2013-01-26 MED ORDER — FLUTICASONE PROPIONATE 50 MCG/ACT NA SUSP: 1.0000 | Freq: Every day | NASAL | Status: AC

## 2013-01-26 NOTE — Progress Notes (Signed)
  Subjective:    Patient ID: Chelsey Sanford, female    DOB: 08-02-1975, 37 y.o.   MRN: [id number]  Sinusitis Associated symptoms include chills, congestion, coughing, ear pain (L), headaches and sinus pressure.     Past Medical History  Diagnosis Date  . ALLERGIC RHINITIS   . ANEMIA-NOS   . ENDOMETRIOSIS   . Lumbar disc disease   . DYSAUTONOMIA   . Headache(784.0)   . Irritable bowel syndrome   . Anxiety   . Delayed gastric emptying     dx at [facility] by gastric emptying study per patient  . GERD (gastroesophageal reflux disease) 10/06/2011  . POTS (postural orthostatic tachycardia syndrome)     Review of Systems  Constitutional: Positive for chills and fatigue. Negative for fever.  HENT: Positive for ear pain (L), congestion, postnasal drip, sinus pressure and tinnitus. Negative for rhinorrhea and ear discharge.   Eyes: Negative for pain and redness.  Respiratory: Positive for cough.   Neurological: Positive for headaches. Negative for seizures and facial asymmetry.       Objective:   Physical Exam BP 110/82  Pulse 83  Temp(Src) 98.6 F (37 C) (Oral)  SpO2 97% Wt Readings from Last 3 Encounters:  01/16/13 166 lb (75.297 kg)  09/03/12 168 lb (76.204 kg)  01/02/12 167 lb 1.9 oz (75.805 kg)   Constitutional: She appears well-developed and well-nourished. No distress.  HENT: Head: Normocephalic and atraumatic. Sinus tenderness and pressure to palpation. Ears: R TM ok but L TM hazy, no erythema or effusion; Nose: Nose normal. Mouth/Throat: Oropharynx is clear and moist. No oropharyngeal exudate.  Eyes: Conjunctivae and EOM are normal. Pupils are equal, round, and reactive to light. No scleral icterus.  Neck: Normal range of motion. Neck supple. No JVD present. No thyromegaly present.  Cardiovascular: Normal rate, regular rhythm and normal heart sounds.  No murmur heard. No BLE edema. Pulmonary/Chest: Effort normal and breath sounds normal. No respiratory distress. She has no  wheezes.  Neurological: She is alert and oriented to person, place, and time. No cranial nerve deficit. Coordination normal.  Skin: Skin is warm and dry. No rash noted. No erythema.  Psychiatric: She has a normal mood and affect. Her behavior is normal. Judgment and thought content normal.   Lab Results  Component Value Date   WBC 8.7 09/14/2011   HGB 13.3 09/14/2011   HCT 39.4 09/14/2011   PLT 255.0 09/14/2011   GLUCOSE 95 01/16/2013   ALT 32 01/14/2007   AST 22 01/14/2007   NA 135 01/16/2013   K 3.8 01/16/2013   CL 100 01/16/2013   CREATININE 0.7 01/16/2013   BUN 13 01/16/2013   CO2 29 01/16/2013   TSH 2.85 01/14/2007   INR 0.9 09/14/2011       Assessment & Plan:   sinus headache, frontal Acute on chronic sinusitis -unimproved following Z-Pak administration last week eustachian tube dysfunction, chronic on left side Migraine headaches, hx Dysautonomia/POTS  Levaquin qd x 10d -We have reviewed the potential interaction and arrhythmia risk when taken with citalopram. Patient has taken fluoroquinolone with citalopram in past and accepts risk because "Levaquin is the only thing that helps"and has done well without complications in the past Steroid IM for inflammation Check CT sinus now and refer to ENT given recurrent problems Continue nasal steroids, antihistamines and decongestants as ongoing Also continue hydration - home IVF as per standing order

## 2013-01-26 NOTE — Patient Instructions (Signed)
It was good to see you today. Levaquin for sinus infection and Medrol injection for inflammation today Your prescription(s) have been submitted to your pharmacy. Please take as directed and contact our office if you believe you are having problem(s) with the medication(s). We'll make referral for CT of sinuses and to Dr. Jearld Fenton for ENT specialty evaluation. My office will call regarding these appointments Continue ongoing nasal spray, allergy medications, nasal sinus rinse and decongestants as needed Your annual flu shot was given and/or updated today.

## 2013-01-26 NOTE — Assessment & Plan Note (Signed)
Chronic POTS/dysautonomia Currently decreasing Mestinon as tolerated and daily Florinef on hold Orthostatic symptoms as well as GI dysfunction in past, currently stable despite acute infection Follows with cardiology annually for same, [facility] for specialist care

## 2013-01-26 NOTE — Assessment & Plan Note (Signed)
chronic allergic sinusitis symptoms - Unable to tolerate most decongestants due to dysautonomia (POTS) Ok for "plain" antihistamine with ongoing nasal steroid ENT followup as planned for chronic and acute sinusitis symptoms

## 2013-01-29 ENCOUNTER — Ambulatory Visit (INDEPENDENT_AMBULATORY_CARE_PROVIDER_SITE_OTHER)
Admission: RE | Admit: 2013-01-29 | Discharge: 2013-01-29 | Disposition: A | Discharge status: Home / Self Care | Payer: BC Managed Care – PPO | Source: Ambulatory Visit | Attending: Internal Medicine | Admitting: Internal Medicine

## 2013-01-29 DIAGNOSIS — Q078 Other specified congenital malformations of nervous system: Secondary | ICD-10-CM

## 2013-01-29 DIAGNOSIS — J019 Acute sinusitis, unspecified: Secondary | ICD-10-CM

## 2013-01-29 DIAGNOSIS — J309 Allergic rhinitis, unspecified: Secondary | ICD-10-CM

## 2013-01-29 DIAGNOSIS — J329 Chronic sinusitis, unspecified: Secondary | ICD-10-CM

## 2013-02-06 ENCOUNTER — Telehealth: Payer: Self-pay | Admitting: Internal Medicine

## 2013-02-06 NOTE — Telephone Encounter (Signed)
New problem:  Chelsey Sanford states she is calling regarding surgical clearance for sinus surgery.... Please call her back

## 2013-02-06 NOTE — Telephone Encounter (Signed)
Spoke with Chelsey Sanford, pt is going to have endoscopic sinus surgery, they want to know if dr Tenny Craw thinks okay to do at the outpatient center or does it need to be in the hosp. Will forward to dr Tenny Craw for her review

## 2013-02-10 NOTE — Telephone Encounter (Signed)
I spoke with [facility] and made her aware that Dr Tenny Craw has not responded to this message at this time. Chelsey Sanford is trying to determine if the pt's endoscopic sinus surgery can be performed in the outpatient center or the hospital.  The pt would like to get this procedure done ASAP due to pain. I will route this message back to Dr Tenny Craw.

## 2013-02-10 NOTE — Telephone Encounter (Signed)
F/u  ° ° °Please advise of previous message. °

## 2013-02-11 NOTE — Telephone Encounter (Signed)
OK to have surgery at outpatient center.  Sent note to Dr Jearld Fenton.

## 2013-02-17 ENCOUNTER — Other Ambulatory Visit: Payer: Self-pay | Admitting: Otolaryngology

## 2013-03-24 ENCOUNTER — Other Ambulatory Visit: Payer: Self-pay | Admitting: Internal Medicine

## 2013-04-17 ENCOUNTER — Telehealth: Payer: Self-pay

## 2013-04-17 MED ORDER — RIZATRIPTAN BENZOATE 10 MG PO TABS: 10.0000 mg | ORAL_TABLET | ORAL | Status: DC | PRN

## 2013-04-17 NOTE — Telephone Encounter (Signed)
Phone call from patient [phone] stating she has been having headaches frequently and Maxalt was the only medication that did help. When Maxalt was written for her last time her insurance did not approve it because she did not have migraines at the frequency the script was written.

## 2013-04-17 NOTE — Telephone Encounter (Signed)
Per Dr Rene Paci Maxalt 10 mg 1 at onset of headache may repeat 2 hours later PRN max 30 mg in 24 hours  Patient aware this is being sent

## 2013-04-17 NOTE — Telephone Encounter (Signed)
freq HA and increase in frequency of migraines noted in EMR Please send refill on Maxalt as previously prescribed Thanks

## 2013-04-29 ENCOUNTER — Encounter: Payer: Self-pay | Admitting: Internal Medicine

## 2013-04-29 ENCOUNTER — Ambulatory Visit (INDEPENDENT_AMBULATORY_CARE_PROVIDER_SITE_OTHER): Payer: BC Managed Care – PPO | Admitting: Internal Medicine

## 2013-04-29 VITALS — BP 110/80 | HR 80 | Temp 98.0°F | Resp 16 | Wt 172.0 lb

## 2013-04-29 DIAGNOSIS — M79632 Pain in left forearm: Secondary | ICD-10-CM | POA: Insufficient documentation

## 2013-04-29 DIAGNOSIS — M79609 Pain in unspecified limb: Secondary | ICD-10-CM

## 2013-04-29 DIAGNOSIS — M7989 Other specified soft tissue disorders: Secondary | ICD-10-CM

## 2013-04-29 DIAGNOSIS — I809 Phlebitis and thrombophlebitis of unspecified site: Secondary | ICD-10-CM | POA: Insufficient documentation

## 2013-04-29 DIAGNOSIS — Q078 Other specified congenital malformations of nervous system: Secondary | ICD-10-CM

## 2013-04-29 NOTE — Assessment & Plan Note (Signed)
Phlebitis

## 2013-04-29 NOTE — Assessment & Plan Note (Signed)
NS IVF weekly

## 2013-04-29 NOTE — Patient Instructions (Signed)
Pennsaid bid Aspirin 325 mg 2 tablets twice a day x 5-7 days with food Warm compress

## 2013-04-29 NOTE — Assessment & Plan Note (Signed)
12/14 post- IVF ASA 325 2 po bid ACE

## 2013-04-29 NOTE — Progress Notes (Signed)
Pre visit review using our clinic review tool, if applicable. No additional management support is needed unless otherwise documented below in the visit note. 

## 2013-04-29 NOTE — Assessment & Plan Note (Signed)
12/14 post- IVF

## 2013-04-30 ENCOUNTER — Encounter: Payer: Self-pay | Admitting: Internal Medicine

## 2013-04-30 HISTORY — PX: SINUS EXPLORATION: SHX5214

## 2013-04-30 HISTORY — PX: ELBOW SURGERY: SHX618

## 2013-04-30 NOTE — Progress Notes (Signed)
   Subjective:    Patient ID: Chelsey Sanford, female    DOB: December 21, 1975, 38 y.o.   MRN: [id number]  HPI  C/o L forearm pain, redness and swelling in 2 places following IVF administration a few days earlier  Review of Systems  Constitutional: Negative for chills, activity change, appetite change, fatigue and unexpected weight change.  HENT: Negative for congestion, mouth sores and sinus pressure.   Eyes: Negative for visual disturbance.  Respiratory: Negative for cough and chest tightness.   Gastrointestinal: Negative for nausea and abdominal pain.  Genitourinary: Negative for frequency, difficulty urinating and vaginal pain.  Musculoskeletal: Negative for back pain, gait problem and myalgias.  Skin: Positive for color change. Negative for pallor and rash.  Neurological: Negative for dizziness, tremors, weakness, numbness and headaches.  Psychiatric/Behavioral: Negative for confusion and sleep disturbance.       Objective:   Physical Exam  Constitutional: She appears well-developed. No distress.  HENT:  Head: Normocephalic.  Right Ear: External ear normal.  Left Ear: External ear normal.  Nose: Nose normal.  Mouth/Throat: Oropharynx is clear and moist.  Eyes: Conjunctivae are normal. Pupils are equal, round, and reactive to light. Right eye exhibits no discharge. Left eye exhibits no discharge.  Neck: Normal range of motion. Neck supple. No JVD present. No tracheal deviation present. No thyromegaly present.  Cardiovascular: Normal rate, regular rhythm and normal heart sounds.   Pulmonary/Chest: No stridor. No respiratory distress. She has no wheezes.  Abdominal: Soft. Bowel sounds are normal. She exhibits no distension and no mass. There is no tenderness. There is no rebound and no guarding.  Musculoskeletal: She exhibits edema and tenderness.  L forearm is tender in 2 oval shaped areas, very mild edema is present  Lymphadenopathy:    She has no cervical adenopathy.  Neurological:  She displays normal reflexes. No cranial nerve deficit. She exhibits normal muscle tone. Coordination normal.  Skin: No rash noted. No erythema.  Psychiatric: She has a normal mood and affect. Her behavior is normal. Judgment and thought content normal.     Procedure Note :    Procedure :   Point of care (POC) sonography examination   Indication: L forearm pain   Equipment used: Sonosite M-Turbo with HFL38x/13-6 MHz transducer linear probe. The images were stored in the unit and later transferred in storage.  The patient was placed in a decubitus position.  This study revealed a findings c/w superficial phlebitis in the L forearm   Impression: L forearm findings c/w superficial phlebitis        Assessment & Plan:

## 2013-05-12 ENCOUNTER — Other Ambulatory Visit: Payer: Self-pay | Admitting: Internal Medicine

## 2013-05-20 ENCOUNTER — Other Ambulatory Visit: Payer: Self-pay | Admitting: Internal Medicine

## 2013-06-03 ENCOUNTER — Other Ambulatory Visit: Payer: Self-pay | Admitting: Internal Medicine

## 2013-06-08 LAB — HM PAP SMEAR

## 2013-06-18 ENCOUNTER — Ambulatory Visit (INDEPENDENT_AMBULATORY_CARE_PROVIDER_SITE_OTHER): Payer: BC Managed Care – PPO | Admitting: Internal Medicine

## 2013-06-18 ENCOUNTER — Encounter: Payer: Self-pay | Admitting: Internal Medicine

## 2013-06-18 ENCOUNTER — Other Ambulatory Visit (INDEPENDENT_AMBULATORY_CARE_PROVIDER_SITE_OTHER): Payer: BC Managed Care – PPO

## 2013-06-18 VITALS — BP 120/82 | HR 73 | Temp 98.1°F | Wt 175.8 lb

## 2013-06-18 DIAGNOSIS — R5381 Other malaise: Secondary | ICD-10-CM

## 2013-06-18 DIAGNOSIS — Q078 Other specified congenital malformations of nervous system: Secondary | ICD-10-CM

## 2013-06-18 DIAGNOSIS — R635 Abnormal weight gain: Secondary | ICD-10-CM

## 2013-06-18 DIAGNOSIS — R5383 Other fatigue: Secondary | ICD-10-CM

## 2013-06-18 LAB — CBC WITH DIFFERENTIAL/PLATELET
BASOS ABS: 0.2 10*3/uL — AB (ref 0.0–0.1)
Basophils Relative: 2.4 % (ref 0.0–3.0)
Eosinophils Absolute: 0.1 10*3/uL (ref 0.0–0.7)
Eosinophils Relative: 1.3 % (ref 0.0–5.0)
HEMATOCRIT: 39.2 % (ref 36.0–46.0)
HEMOGLOBIN: 13 g/dL (ref 12.0–15.0)
LYMPHS ABS: 2.2 10*3/uL (ref 0.7–4.0)
Lymphocytes Relative: 30.4 % (ref 12.0–46.0)
MCHC: 33.2 g/dL (ref 30.0–36.0)
MCV: 83.1 fl (ref 78.0–100.0)
MONO ABS: 0.5 10*3/uL (ref 0.1–1.0)
Monocytes Relative: 7.4 % (ref 3.0–12.0)
NEUTROS ABS: 4.3 10*3/uL (ref 1.4–7.7)
Neutrophils Relative %: 58.5 % (ref 43.0–77.0)
Platelets: 279 10*3/uL (ref 150.0–400.0)
RBC: 4.72 Mil/uL (ref 3.87–5.11)
RDW: 12.9 % (ref 11.5–14.6)
WBC: 7.4 10*3/uL (ref 4.5–10.5)

## 2013-06-18 LAB — HEPATIC FUNCTION PANEL
ALBUMIN: 3.9 g/dL (ref 3.5–5.2)
ALK PHOS: 38 U/L — AB (ref 39–117)
ALT: 27 U/L (ref 0–35)
AST: 24 U/L (ref 0–37)
Bilirubin, Direct: 0.1 mg/dL (ref 0.0–0.3)
Total Bilirubin: 0.6 mg/dL (ref 0.3–1.2)
Total Protein: 7.2 g/dL (ref 6.0–8.3)

## 2013-06-18 LAB — TSH: TSH: 2.5 u[IU]/mL (ref 0.35–5.50)

## 2013-06-18 LAB — BASIC METABOLIC PANEL
BUN: 9 mg/dL (ref 6–23)
CALCIUM: 9.2 mg/dL (ref 8.4–10.5)
CO2: 23 mEq/L (ref 19–32)
CREATININE: 0.7 mg/dL (ref 0.4–1.2)
Chloride: 103 mEq/L (ref 96–112)
GFR: 108.05 mL/min (ref >60.00)
Glucose, Bld: 95 mg/dL (ref 70–99)
Potassium: 4.4 mEq/L (ref 3.5–5.1)
Sodium: 135 mEq/L (ref 135–145)

## 2013-06-18 LAB — VITAMIN B12: Vitamin B-12: 438 pg/mL (ref 211–911)

## 2013-06-18 LAB — T4, FREE: Free T4: 0.8 ng/dL (ref 0.60–1.60)

## 2013-06-18 NOTE — Progress Notes (Signed)
Pre-visit discussion using our clinic review tool. No additional management support is needed unless otherwise documented below in the visit note.  

## 2013-06-18 NOTE — Progress Notes (Signed)
Subjective:    Patient ID: Chelsey Sanford, female    DOB: Feb 22, 1976, 38 y.o.   MRN: [id number]  HPI See CC:  Chief Complaint  Patient presents with  . Follow-up    want to discuss thyroid- hair loss, weight gain   Also reviewed chronic medical issues and interval medical events  Past Medical History  Diagnosis Date  . ALLERGIC RHINITIS   . ANEMIA-NOS   . ENDOMETRIOSIS   . Lumbar disc disease   . DYSAUTONOMIA   . Headache(784.0)   . Irritable bowel syndrome   . Anxiety   . Delayed gastric emptying     dx at [facility] by gastric emptying study per patient  . GERD (gastroesophageal reflux disease) 10/06/2011  . POTS (postural orthostatic tachycardia syndrome)     Review of Systems  Constitutional: Positive for fatigue and unexpected weight change. Negative for fever.  Eyes: Negative for pain and redness.  Skin:       Hair thinning/loss  Neurological: Negative for seizures.       Objective:   Physical Exam BP 120/82  Pulse 73  Temp(Src) 98.1 F (36.7 C) (Oral)  Wt 175 lb 12.8 oz (79.742 kg)  SpO2 97% Wt Readings from Last 3 Encounters:  06/18/13 175 lb 12.8 oz (79.742 kg)  04/29/13 172 lb (78.019 kg)  01/16/13 166 lb (75.297 kg)   Constitutional: She is overweight, but appears well-developed and well-nourished. No distress.  Eyes: Conjunctivae and EOM are normal. Pupils are equal, round, and reactive to light. No scleral icterus.  Neck: Normal range of motion. Neck supple. No JVD present. No thyromegaly present.  Cardiovascular: Normal rate, regular rhythm and normal heart sounds.  No murmur heard. No BLE edema. Pulmonary/Chest: Effort normal and breath sounds normal. No respiratory distress. She has no wheezes.  Neurological: She is alert and oriented to person, place, and time. No cranial nerve deficit. Coordination normal.  Skin: Scalp with diffuse fine and mildly thin hair but no alopecia. Skin is warm and dry. No rash noted. No erythema.  Psychiatric: She has a normal  mood and affect. Her behavior is normal. Judgment and thought content normal.   Lab Results  Component Value Date   WBC 8.7 09/14/2011   HGB 13.3 09/14/2011   HCT 39.4 09/14/2011   PLT 255.0 09/14/2011   GLUCOSE 95 01/16/2013   ALT 32 01/14/2007   AST 22 01/14/2007   NA 135 01/16/2013   K 3.8 01/16/2013   CL 100 01/16/2013   CREATININE 0.7 01/16/2013   BUN 13 01/16/2013   CO2 29 01/16/2013   TSH 2.85 01/14/2007   INR 0.9 09/14/2011       Assessment & Plan:   Fatigue/hair changes/weight changes - nonspecific symptoms and exam - Concern for thyroid dz -  Check TFTs - TSH, T3 and FT4 - if abn, refer to endo Also check b12, CBC, BMEt, LFTs and Vit D Education and reassurance provided  Problem List Items Addressed This Visit   DYSAUTONOMIA     Chronic POTS/dysautonomia Currently decreasing Mestinon as tolerated and daily Florinef on hold Orthostatic symptoms as well as GI dysfunction in past, currently stable Follows with cardiology annually for same, [facility] for specialist care    Relevant Orders      TSH      T4, free      Vitamin B12      Vit D  25 hydroxy (rtn osteoporosis monitoring)      CBC with Differential  Basic metabolic panel      Hepatic function panel    Other Visit Diagnoses   Fatigue    -  Primary    Relevant Orders       TSH       T4, free       Vitamin B12       Vit D  25 hydroxy (rtn osteoporosis monitoring)       CBC with Differential       Basic metabolic panel       Hepatic function panel    Weight gain, abnormal

## 2013-06-18 NOTE — Assessment & Plan Note (Signed)
Chronic POTS/dysautonomia Currently decreasing Mestinon as tolerated and daily Florinef on hold Orthostatic symptoms as well as GI dysfunction in past, currently stable Follows with cardiology annually for same, [facility] for specialist care

## 2013-06-18 NOTE — Patient Instructions (Addendum)
It was good to see you today.  We have reviewed your prior records including labs and tests today  Test(s) ordered today. Your results will be released to [place] (or called to you) after review, usually within 72hours after test completion. If any changes need to be made, you will be notified at that same time.  Medications reviewed and updated, no changes recommended at this time.  Please schedule followup for annual exam in next few months, call sooner if problems.  Fatigue Fatigue is a feeling of tiredness, lack of energy, lack of motivation, or feeling tired all the time. Having enough rest, good nutrition, and reducing stress will normally reduce fatigue. Consult your caregiver if it persists. The nature of your fatigue will help your caregiver to find out its cause. The treatment is based on the cause.  CAUSES  There are many causes for fatigue. Most of the time, fatigue can be traced to one or more of your habits or routines. Most causes fit into one or more of three general areas. They are: Lifestyle problems  Sleep disturbances.  Overwork.  Physical exertion.  Unhealthy habits.  Poor eating habits or eating disorders.  Alcohol and/or drug use .  Lack of proper nutrition (malnutrition). Psychological problems  Stress and/or anxiety problems.  Depression.  Grief.  Boredom. Medical Problems or Conditions  Anemia.  Pregnancy.  Thyroid gland problems.  Recovery from major surgery.  Continuous pain.  Emphysema or asthma that is not well controlled  Allergic conditions.  Diabetes.  Infections (such as mononucleosis).  Obesity.  Sleep disorders, such as sleep apnea.  Heart failure or other heart-related problems.  Cancer.  Kidney disease.  Liver disease.  Effects of certain medicines such as antihistamines, cough and cold remedies, prescription pain medicines, heart and blood pressure medicines, drugs used for treatment of cancer, and some  antidepressants. SYMPTOMS  The symptoms of fatigue include:   Lack of energy.  Lack of drive (motivation).  Drowsiness.  Feeling of indifference to the surroundings. DIAGNOSIS  The details of how you feel help guide your caregiver in finding out what is causing the fatigue. You will be asked about your present and past health condition. It is important to review all medicines that you take, including prescription and non-prescription items. A thorough exam will be done. You will be questioned about your feelings, habits, and normal lifestyle. Your caregiver may suggest blood tests, urine tests, or other tests to look for common medical causes of fatigue.  TREATMENT  Fatigue is treated by correcting the underlying cause. For example, if you have continuous pain or depression, treating these causes will improve how you feel. Similarly, adjusting the dose of certain medicines will help in reducing fatigue.  HOME CARE INSTRUCTIONS   Try to get the required amount of good sleep every night.  Eat a healthy and nutritious diet, and drink enough water throughout the day.  Practice ways of relaxing (including yoga or meditation).  Exercise regularly.  Make plans to change situations that cause stress. Act on those plans so that stresses decrease over time. Keep your work and personal routine reasonable.  Avoid street drugs and minimize use of alcohol.  Start taking a daily multivitamin after consulting your caregiver. SEEK MEDICAL CARE IF:   You have persistent tiredness, which cannot be accounted for.  You have fever.  You have unintentional weight loss.  You have headaches.  You have disturbed sleep throughout the night.  You are feeling sad.  You have  constipation.  You have dry skin.  You have gained weight.  You are taking any new or different medicines that you suspect are causing fatigue.  You are unable to sleep at night.  You develop any unusual swelling of your  legs or other parts of your body. SEEK IMMEDIATE MEDICAL CARE IF:   You are feeling confused.  Your vision is blurred.  You feel faint or pass out.  You develop severe headache.  You develop severe abdominal, pelvic, or back pain.  You develop chest pain, shortness of breath, or an irregular or fast heartbeat.  You are unable to pass a normal amount of urine.  You develop abnormal bleeding such as bleeding from the rectum or you vomit blood.  You have thoughts about harming yourself or committing suicide.  You are worried that you might harm someone else. MAKE SURE YOU:   Understand these instructions.  Will watch your condition.  Will get help right away if you are not doing well or get worse. Document Released: 02/11/2007 Document Revised: 07/09/2011 Document Reviewed: 02/11/2007 [facility] Patient Information 2014 [place]. Fatigue Fatigue is a feeling of tiredness, lack of energy, lack of motivation, or feeling tired all the time. Having enough rest, good nutrition, and reducing stress will normally reduce fatigue. Consult your caregiver if it persists. The nature of your fatigue will help your caregiver to find out its cause. The treatment is based on the cause.  CAUSES  There are many causes for fatigue. Most of the time, fatigue can be traced to one or more of your habits or routines. Most causes fit into one or more of three general areas. They are: Lifestyle problems  Sleep disturbances.  Overwork.  Physical exertion.  Unhealthy habits.  Poor eating habits or eating disorders.  Alcohol and/or drug use .  Lack of proper nutrition (malnutrition). Psychological problems  Stress and/or anxiety problems.  Depression.  Grief.  Boredom. Medical Problems or Conditions  Anemia.  Pregnancy.  Thyroid gland problems.  Recovery from major surgery.  Continuous pain.  Emphysema or asthma that is not well controlled  Allergic  conditions.  Diabetes.  Infections (such as mononucleosis).  Obesity.  Sleep disorders, such as sleep apnea.  Heart failure or other heart-related problems.  Cancer.  Kidney disease.  Liver disease.  Effects of certain medicines such as antihistamines, cough and cold remedies, prescription pain medicines, heart and blood pressure medicines, drugs used for treatment of cancer, and some antidepressants. SYMPTOMS  The symptoms of fatigue include:   Lack of energy.  Lack of drive (motivation).  Drowsiness.  Feeling of indifference to the surroundings. DIAGNOSIS  The details of how you feel help guide your caregiver in finding out what is causing the fatigue. You will be asked about your present and past health condition. It is important to review all medicines that you take, including prescription and non-prescription items. A thorough exam will be done. You will be questioned about your feelings, habits, and normal lifestyle. Your caregiver may suggest blood tests, urine tests, or other tests to look for common medical causes of fatigue.  TREATMENT  Fatigue is treated by correcting the underlying cause. For example, if you have continuous pain or depression, treating these causes will improve how you feel. Similarly, adjusting the dose of certain medicines will help in reducing fatigue.  HOME CARE INSTRUCTIONS   Try to get the required amount of good sleep every night.  Eat a healthy and nutritious diet, and drink enough  water throughout the day.  Practice ways of relaxing (including yoga or meditation).  Exercise regularly.  Make plans to change situations that cause stress. Act on those plans so that stresses decrease over time. Keep your work and personal routine reasonable.  Avoid street drugs and minimize use of alcohol.  Start taking a daily multivitamin after consulting your caregiver. SEEK MEDICAL CARE IF:   You have persistent tiredness, which cannot be  accounted for.  You have fever.  You have unintentional weight loss.  You have headaches.  You have disturbed sleep throughout the night.  You are feeling sad.  You have constipation.  You have dry skin.  You have gained weight.  You are taking any new or different medicines that you suspect are causing fatigue.  You are unable to sleep at night.  You develop any unusual swelling of your legs or other parts of your body. SEEK IMMEDIATE MEDICAL CARE IF:   You are feeling confused.  Your vision is blurred.  You feel faint or pass out.  You develop severe headache.  You develop severe abdominal, pelvic, or back pain.  You develop chest pain, shortness of breath, or an irregular or fast heartbeat.  You are unable to pass a normal amount of urine.  You develop abnormal bleeding such as bleeding from the rectum or you vomit blood.  You have thoughts about harming yourself or committing suicide.  You are worried that you might harm someone else. MAKE SURE YOU:   Understand these instructions.  Will watch your condition.  Will get help right away if you are not doing well or get worse. Document Released: 02/11/2007 Document Revised: 07/09/2011 Document Reviewed: 02/11/2007 [facility] Patient Information 2014 [place].

## 2013-06-19 LAB — VITAMIN D 25 HYDROXY (VIT D DEFICIENCY, FRACTURES): Vit D, 25-Hydroxy: 30 ng/mL (ref 30–89)

## 2013-06-24 ENCOUNTER — Other Ambulatory Visit: Payer: Self-pay | Admitting: Internal Medicine

## 2013-07-06 ENCOUNTER — Other Ambulatory Visit: Payer: Self-pay | Admitting: Internal Medicine

## 2013-07-23 ENCOUNTER — Other Ambulatory Visit: Payer: Self-pay | Admitting: Internal Medicine

## 2013-12-20 ENCOUNTER — Other Ambulatory Visit: Payer: Self-pay | Admitting: Internal Medicine

## 2013-12-21 NOTE — Telephone Encounter (Signed)
Please refill.

## 2013-12-21 NOTE — Telephone Encounter (Signed)
Yes, please refill  Used for dysautonomia

## 2014-01-14 ENCOUNTER — Telehealth: Payer: Self-pay | Admitting: Internal Medicine

## 2014-01-14 NOTE — Telephone Encounter (Signed)
°  Chelsey Sanford with [facility] pharmacy needs to speak with you regarding IV fluids RX, please call at [phone]

## 2014-01-14 NOTE — Telephone Encounter (Signed)
New message      Need new presc for IV fluids

## 2014-01-15 NOTE — Telephone Encounter (Signed)
Ok to try 1/2 NS per Dr. Harrington Challenger Triage spoke with home care.

## 2014-01-17 ENCOUNTER — Other Ambulatory Visit: Payer: Self-pay | Admitting: Internal Medicine

## 2014-02-10 ENCOUNTER — Other Ambulatory Visit: Payer: Self-pay | Admitting: Internal Medicine

## 2014-03-03 ENCOUNTER — Ambulatory Visit (INDEPENDENT_AMBULATORY_CARE_PROVIDER_SITE_OTHER): Payer: BC Managed Care – PPO | Admitting: Internal Medicine

## 2014-03-03 ENCOUNTER — Encounter: Payer: Self-pay | Admitting: Internal Medicine

## 2014-03-03 VITALS — BP 118/80 | HR 86 | Temp 98.1°F | Resp 16 | Ht 66.0 in | Wt 178.0 lb

## 2014-03-03 DIAGNOSIS — J202 Acute bronchitis due to streptococcus: Secondary | ICD-10-CM

## 2014-03-03 MED ORDER — AZITHROMYCIN 500 MG PO TABS: 500.0000 mg | ORAL_TABLET | Freq: Every day | ORAL | Status: DC

## 2014-03-03 MED ORDER — HYDROCOD POLST-CHLORPHEN POLST 10-8 MG/5ML PO LQCR: 5.0000 mL | Freq: Two times a day (BID) | ORAL | Status: DC | PRN

## 2014-03-03 NOTE — Patient Instructions (Signed)

## 2014-03-03 NOTE — Progress Notes (Signed)
   Subjective:    Patient ID: Chelsey Sanford, female    DOB: [birth year], 38 y.o.   MRN: [id number]  Cough This is a new problem. The current episode started in the past 7 days. The problem has been gradually worsening. The problem occurs every few hours. The cough is productive of purulent sputum. Associated symptoms include chills, postnasal drip, rhinorrhea and a sore throat. Pertinent negatives include no chest pain, ear congestion, ear pain, fever, heartburn, hemoptysis, myalgias, nasal congestion, rash, shortness of breath, sweats, weight loss or wheezing. She has tried OTC cough suppressant for the symptoms. The treatment provided mild relief. There is no history of asthma, bronchiectasis, bronchitis, COPD, emphysema, environmental allergies or pneumonia.      Review of Systems  Constitutional: Positive for chills. Negative for fever and weight loss.  HENT: Positive for congestion, dental problem (diffuse upper tooth pain), postnasal drip, rhinorrhea, sinus pressure and sore throat. Negative for ear discharge, ear pain, nosebleeds, sneezing and trouble swallowing.   Eyes: Negative.   Respiratory: Positive for cough. Negative for apnea, hemoptysis, choking, chest tightness, shortness of breath, wheezing and stridor.   Cardiovascular: Negative.  Negative for chest pain, palpitations and leg swelling.  Gastrointestinal: Negative.  Negative for heartburn, nausea, vomiting, abdominal pain, diarrhea, constipation and blood in stool.  Endocrine: Negative.   Genitourinary: Negative.   Musculoskeletal: Negative.  Negative for myalgias.  Skin: Negative.  Negative for rash.  Allergic/Immunologic: Negative for environmental allergies.  Neurological: Negative.   Hematological: Negative.  Negative for adenopathy. Does not bruise/bleed easily.  Psychiatric/Behavioral: Negative.        Objective:   Physical Exam  Constitutional: She is oriented to person, place, and time. She appears well-developed  and well-nourished.  Non-toxic appearance. She does not have a sickly appearance. She does not appear ill. No distress.  HENT:  Head: Normocephalic and atraumatic.  Right Ear: Hearing, tympanic membrane, external ear and ear canal normal.  Left Ear: Hearing, tympanic membrane, external ear and ear canal normal.  Nose: No mucosal edema or rhinorrhea. Right sinus exhibits no maxillary sinus tenderness and no frontal sinus tenderness. Left sinus exhibits no maxillary sinus tenderness and no frontal sinus tenderness.  Mouth/Throat: Mucous membranes are normal. Mucous membranes are not pale, not dry and not cyanotic. No oral lesions. No trismus in the jaw. No uvula swelling. Posterior oropharyngeal erythema present. No oropharyngeal exudate, posterior oropharyngeal edema or tonsillar abscesses.  Eyes: Conjunctivae are normal. Right eye exhibits no discharge. Left eye exhibits no discharge. No scleral icterus.  Neck: Normal range of motion. Neck supple. No JVD present. No tracheal deviation present. No thyromegaly present.  Cardiovascular: Normal rate, regular rhythm, normal heart sounds and intact distal pulses.  Exam reveals no gallop and no friction rub.   No murmur heard. Pulmonary/Chest: Effort normal and breath sounds normal. No stridor. No respiratory distress. She has no wheezes. She has no rales. She exhibits no tenderness.  Abdominal: Soft. Bowel sounds are normal. She exhibits no distension and no mass. There is no tenderness. There is no rebound and no guarding.  Musculoskeletal: Normal range of motion. She exhibits no edema or tenderness.  Lymphadenopathy:    She has no cervical adenopathy.  Neurological: She is oriented to person, place, and time.  Skin: Skin is warm and dry. No rash noted. She is not diaphoretic. No erythema. No pallor.  Vitals reviewed.         Assessment & Plan:

## 2014-03-03 NOTE — Assessment & Plan Note (Signed)
Will treat the infection with zithromax and will control the cough with tussionex

## 2014-03-03 NOTE — Progress Notes (Signed)
Pre visit review using our clinic review tool, if applicable. No additional management support is needed unless otherwise documented below in the visit note. 

## 2014-03-08 ENCOUNTER — Other Ambulatory Visit: Payer: Self-pay | Admitting: Internal Medicine

## 2014-03-23 ENCOUNTER — Other Ambulatory Visit: Payer: Self-pay | Admitting: Internal Medicine

## 2014-03-31 ENCOUNTER — Telehealth: Payer: Self-pay | Admitting: Internal Medicine

## 2014-03-31 NOTE — Telephone Encounter (Signed)
Pt requesting to see Dr Asa Lente soon, explained schedule and availability regardless pt wanted a note sent about getting appt with PCP. Pt has numerous health issues and would PREFER to see only PCP. Rash, toe nail falling off - pt takes numerous meds and feels PCP knows her hx and all the meds she is on. Pls advise.

## 2014-04-01 NOTE — Telephone Encounter (Signed)
Called number listed for pt. No answer and no voicemail.  Closing note until pt called back.   Will send pt a my chart message.

## 2014-04-21 ENCOUNTER — Other Ambulatory Visit: Payer: Self-pay | Admitting: Internal Medicine

## 2014-05-03 ENCOUNTER — Other Ambulatory Visit: Payer: Self-pay | Admitting: Internal Medicine

## 2014-05-03 MED ORDER — PREGABALIN 25 MG PO CAPS: 25.0000 mg | ORAL_CAPSULE | Freq: Three times a day (TID) | ORAL | Status: DC

## 2014-05-03 NOTE — Telephone Encounter (Signed)
LVM for pt to call back.

## 2014-05-03 NOTE — Telephone Encounter (Signed)
Pt called back.   Pt would like to know the PCP opinion and what pt should do since the sx of Lupus are already apart of a current diagnosis. Pt also stated that she is starting to feel more pain now and did not sleep at all last night.   She was rxed valacyclovir 1gM: 1 tab po tid. Started yesterday.   Please advise what you feel pt should do.   [phone] pt cell number.

## 2014-05-03 NOTE — Telephone Encounter (Signed)
Spoke to pt about MD response. rx called in.

## 2014-05-03 NOTE — Telephone Encounter (Signed)
For pain, begin Lyrica 2 or 3 times daily (medication pended to call into pharmacy) Continue Valtrex as prescribed 3 times daily until done I am uncertain of relationship between shingles and potential lupus, however we can do labs for lupus once acute symptoms of shingles have resolved -okay to see provider in my office for this if needed prior to March Thank you for the message

## 2014-05-03 NOTE — Telephone Encounter (Signed)
Patient would like a call back.  She states she went to an Urgent Care yesterday.  They think she has shingles near her eye but suggest she get checked for lopus.  She understands Dr. Asa Lente is booked out until March but would like to have a conversation about it.

## 2014-05-18 ENCOUNTER — Other Ambulatory Visit: Payer: Self-pay | Admitting: Internal Medicine

## 2014-05-28 ENCOUNTER — Other Ambulatory Visit: Payer: Self-pay | Admitting: Internal Medicine

## 2014-05-28 NOTE — Telephone Encounter (Signed)
Please advise on refill. Patient is overdue for an appointment and we have been putting this on her rx for a while, but she still has failed to schedule an appointment. Please advise. Thanks, MI

## 2014-06-22 ENCOUNTER — Other Ambulatory Visit: Payer: Self-pay | Admitting: Internal Medicine

## 2014-06-24 NOTE — Telephone Encounter (Signed)
OK to refill   She does need to make an appt to see me, recheck BP

## 2014-06-24 NOTE — Telephone Encounter (Signed)
OK to refill

## 2014-06-24 NOTE — Telephone Encounter (Signed)
Routing to MD.

## 2014-07-12 ENCOUNTER — Telehealth: Payer: Self-pay | Admitting: Internal Medicine

## 2014-07-12 NOTE — Telephone Encounter (Signed)
Patient needs refill for pindolol to her pharmacy Also please set to see me this Friday at 1:45 PM

## 2014-07-13 ENCOUNTER — Other Ambulatory Visit: Payer: Self-pay | Admitting: *Deleted

## 2014-07-13 MED ORDER — PINDOLOL 5 MG PO TABS: 5.0000 mg | ORAL_TABLET | Freq: Every day | ORAL | Status: DC

## 2014-07-13 NOTE — Telephone Encounter (Signed)
Refill completed, Thomes Dinning / scheduler for Dr Harrington Challenger will set the appointment.

## 2014-07-13 NOTE — Addendum Note (Signed)
Addended by: Jonathon Jordan on: 07/13/2014 09:04 AM   Modules accepted: Orders

## 2014-07-16 ENCOUNTER — Ambulatory Visit (INDEPENDENT_AMBULATORY_CARE_PROVIDER_SITE_OTHER): Payer: BLUE CROSS/BLUE SHIELD | Admitting: Internal Medicine

## 2014-07-16 VITALS — BP 127/85 | HR 77 | Ht 66.0 in | Wt 176.0 lb

## 2014-07-16 DIAGNOSIS — R000 Tachycardia, unspecified: Secondary | ICD-10-CM

## 2014-07-16 DIAGNOSIS — G901 Familial dysautonomia [Riley-Day]: Secondary | ICD-10-CM

## 2014-07-16 DIAGNOSIS — I951 Orthostatic hypotension: Secondary | ICD-10-CM

## 2014-07-16 DIAGNOSIS — G90A Postural orthostatic tachycardia syndrome (POTS): Secondary | ICD-10-CM

## 2014-07-16 DIAGNOSIS — G909 Disorder of the autonomic nervous system, unspecified: Secondary | ICD-10-CM

## 2014-07-16 LAB — CBC
HEMATOCRIT: 38.8 % (ref 36.0–46.0)
HEMOGLOBIN: 13.3 g/dL (ref 12.0–15.0)
MCHC: 34.2 g/dL (ref 30.0–36.0)
MCV: 80.9 fl (ref 78.0–100.0)
PLATELETS: 230 10*3/uL (ref 150.0–400.0)
RBC: 4.8 Mil/uL (ref 3.87–5.11)
RDW: 13.2 % (ref 11.5–15.5)
WBC: 7.1 10*3/uL (ref 4.0–10.5)

## 2014-07-16 NOTE — Progress Notes (Signed)
Cardiology Office Note   Date:  07/16/2014   ID:  Chelsey Sanford, DOB 04-10-1976, MRN [id number]  PCP:  Gwendolyn Grant, MD  Cardiologist:   Dorris Carnes, MD   Patient returns for continued f/u of autonomic dysfunction    History of Present Illness: Chelsey Sanford is a 39 y.o. female with a history of POTS  I saw her in 2014 Since seen she has done fairly well  No syncope  Does have days of dizziness,  HA   Still getting IV fluids (2L) every other week   Still has some cramping in legs     Current Outpatient Prescriptions  Medication Sig Dispense Refill  . citalopram (CELEXA) 20 MG tablet Take 1 tablet (20 mg total) by mouth daily. 90 tablet 1  . cyanocobalamin (,VITAMIN B-12,) 1000 MCG/ML injection Inject 1 mL (1,000 mcg total) into the muscle every 30 (thirty) days. 10 mL 0  . cyclobenzaprine (FLEXERIL) 10 MG tablet Take 1 tablet (10 mg total) by mouth 2 (two) times daily as needed for muscle spasms. 20 tablet 0  . docusate sodium (COLACE) 100 MG capsule Take 100 mg by mouth daily.     . drospirenone-ethinyl estradiol (YASMIN,ZARAH,SYEDA) 3-0.03 MG tablet Take 1 tablet by mouth every evening.    . iron polysaccharides (NU-IRON) 150 MG capsule Take 150 mg by mouth daily.      Marland Kitchen MAGOX 400 400 (241.3 MG) MG tablet TAKE 1 TABLET BY MOUTH EVERY DAY 30 tablet 5  . meloxicam (MOBIC) 7.5 MG tablet Take 7.5 mg by mouth daily as needed for pain. For back pain    . pantoprazole (PROTONIX) 40 MG tablet TAKE 1 TABLET BY MOUTH DAILY 30 tablet 5  . pindolol (VISKEN) 5 MG tablet Take 1 tablet (5 mg total) by mouth daily. 30 tablet 0  . Probiotic Product (ALIGN) 4 MG CAPS Take 4 mg by mouth daily.     . pseudoephedrine (SUDAFED) 30 MG tablet Take 30 mg by mouth daily as needed for congestion. For nasal congestion    . pyridostigmine (MESTINON) 60 MG tablet TAKE 1 TABLET BY MOUTH TWICE DAILY 60 tablet 0  . rizatriptan (MAXALT) 10 MG tablet TAKE 1 TABLET BY MOUTH AS NEEDED FOR MIGRAINE. MAY REPEAT IN 2  HOURS IF NEEDED. MAXIMUM OF 30 MG IN 24 HOURS. 12 tablet 0  . sodium chloride irrigation (CURITY STERILE SALINE) 0.9 % irrigation Irrigate with 1,000-2,000 mLs as directed once a week. On Sundays - based on hydration levels 500 mL 11  . SUMAtriptan-naproxen (TREXIMET) 85-500 MG per tablet Take 1 tablet by mouth every 2 (two) hours as needed for migraine. 10 tablet 0  . triamcinolone (NASACORT AQ) 55 MCG/ACT nasal inhaler Place 2 sprays into both nostrils daily as needed (Patient taking 2 sprays in each nare daily as need for seasonal alllergies). For stuffiness    . [DISCONTINUED] magnesium oxide (MAG-OX) 400 MG tablet Take 1 tablet (400 mg total) by mouth daily. 30 tablet 5   No current facility-administered medications for this visit.    Allergies:   Adhesive; Sulfonamide derivatives; Amoxicillin; Erythromycin; Latex; and Penicillins   Past Medical History  Diagnosis Date  . ALLERGIC RHINITIS   . ANEMIA-NOS   . ENDOMETRIOSIS   . Lumbar disc disease   . DYSAUTONOMIA   . Headache(784.0)   . Irritable bowel syndrome   . Anxiety   . Delayed gastric emptying     dx at [facility] by gastric emptying study per patient  .  GERD (gastroesophageal reflux disease) 10/06/2011  . POTS (postural orthostatic tachycardia syndrome)     Past Surgical History  Procedure Laterality Date  . Ankle reconstruction  1993  . Tonsillectomy  1996  . Wisdom tooth extraction       Social History:  The patient  reports that she quit smoking about 18 years ago. She has never used smokeless tobacco. She reports that she drinks alcohol. She reports that she does not use illicit drugs.   Family History:  The patient's family history includes Colon polyps in her mother; Crohn's disease in her maternal grandmother; Migraines in her mother; Skin cancer in her mother; Thyroid disease in her mother. There is no history of COPD or Colon cancer.    ROS:  Please see the history of present illness. All other systems are  reviewed and  Negative to the above problem except as noted.    PHYSICAL EXAM: VS:  BP 127/85 mmHg  Pulse 77  Ht 5\' 6"  (1.676 m)  Wt 176 lb (79.833 kg)  BMI 28.42 kg/m2  GEN: Well nourished, well developed, in no acute distress HEENT: normal Neck: no JVD, carotid bruits, or masses Cardiac: RRR; no murmurs, rubs, or gallops,no edema  Respiratory:  clear to auscultation bilaterally, normal work of breathing GI: soft, nontender, nondistended, + BS  No hepatomegaly  MS: no deformity Moving all extremities   Skin: warm and dry, no rash Neuro:  Strength and sensation are intact Psych: euthymic mood, full affect   EKG:  EKG is ordered today.  SR 77 bpm     Lipid Panel No results found for: CHOL, TRIG, HDL, CHOLHDL, VLDL, LDLCALC, LDLDIRECT    Wt Readings from Last 3 Encounters:  07/16/14 176 lb (79.833 kg)  03/03/14 178 lb (80.74 kg)  06/18/13 175 lb 12.8 oz (79.742 kg)      ASSESSMENT AND PLAN: 1  Dysautonomia  Pateint is not orhtostatic on exam  Still with some heart rate increase I have asked her to call near end of next week  BP may be high now because she is using decongestant WOuld like t oget her of of IV fluids  She has not been on midodrine    Will check BMET, TSH, CBC, ANA      Current medicines are reviewed at length with the patient today.  The patient does not have concerns regarding medicines.  The following changes have been made: NOne   Labs/ tests ordered today include:  As noted above   No orders of the defined types were placed in this encounter.     Signed, Dorris Carnes, MD  07/16/2014 2:38 PM    [address], [place], [place]  [address] Phone: [phone]; Fax: [phone]

## 2014-07-16 NOTE — Patient Instructions (Signed)
Your physician recommends that you continue on your current medications as directed. Please refer to the Current Medication list given to you today. Your physician recommends that you return for lab work today (bmet, cbc, tsh, ana)

## 2014-07-19 LAB — BASIC METABOLIC PANEL
BUN: 9 mg/dL (ref 6–23)
CHLORIDE: 106 meq/L (ref 96–112)
CO2: 24 meq/L (ref 19–32)
Calcium: 9.3 mg/dL (ref 8.4–10.5)
Creatinine, Ser: 0.67 mg/dL (ref 0.40–1.20)
GFR: 103.75 mL/min (ref >60.00)
Glucose, Bld: 89 mg/dL (ref 70–99)
POTASSIUM: 4 meq/L (ref 3.5–5.1)
SODIUM: 140 meq/L (ref 135–145)

## 2014-07-19 LAB — ANA: ANA: NEGATIVE

## 2014-07-19 LAB — TSH: TSH: 2.57 u[IU]/mL (ref 0.35–4.50)

## 2014-08-20 ENCOUNTER — Other Ambulatory Visit: Payer: Self-pay

## 2014-08-20 MED ORDER — PINDOLOL 5 MG PO TABS: 5.0000 mg | ORAL_TABLET | Freq: Every day | ORAL | Status: DC

## 2014-08-26 ENCOUNTER — Telehealth: Payer: Self-pay | Admitting: Internal Medicine

## 2014-08-26 NOTE — Telephone Encounter (Signed)
Called to see how she was feeling  Had called in a wt or 2 ago with gi complaints  Was going to take IV fluid that weekend. Left msg on voicemail

## 2014-09-12 ENCOUNTER — Other Ambulatory Visit: Payer: Self-pay | Admitting: Internal Medicine

## 2014-09-29 ENCOUNTER — Ambulatory Visit (HOSPITAL_COMMUNITY)
Admission: RE | Admit: 2014-09-29 | Discharge: 2014-09-29 | Disposition: A | Discharge status: Home / Self Care | Payer: BLUE CROSS/BLUE SHIELD | Source: Ambulatory Visit | Attending: Adult Health | Admitting: Adult Health

## 2014-09-29 ENCOUNTER — Telehealth: Payer: Self-pay | Admitting: Adult Health

## 2014-09-29 ENCOUNTER — Ambulatory Visit (INDEPENDENT_AMBULATORY_CARE_PROVIDER_SITE_OTHER): Payer: BLUE CROSS/BLUE SHIELD | Admitting: Adult Health

## 2014-09-29 ENCOUNTER — Encounter: Payer: Self-pay | Admitting: Adult Health

## 2014-09-29 VITALS — BP 126/86 | Temp 98.4°F | Wt 171.4 lb

## 2014-09-29 DIAGNOSIS — R1011 Right upper quadrant pain: Secondary | ICD-10-CM | POA: Diagnosis not present

## 2014-09-29 DIAGNOSIS — K7689 Other specified diseases of liver: Secondary | ICD-10-CM | POA: Diagnosis not present

## 2014-09-29 DIAGNOSIS — J0110 Acute frontal sinusitis, unspecified: Secondary | ICD-10-CM

## 2014-09-29 DIAGNOSIS — R1084 Generalized abdominal pain: Secondary | ICD-10-CM

## 2014-09-29 LAB — CBC WITH DIFFERENTIAL/PLATELET
Basophils Absolute: 0 10*3/uL (ref 0.0–0.1)
Basophils Relative: 0.3 % (ref 0.0–3.0)
EOS ABS: 0.2 10*3/uL (ref 0.0–0.7)
EOS PCT: 1.6 % (ref 0.0–5.0)
HCT: 40.4 % (ref 36.0–46.0)
HEMOGLOBIN: 13.6 g/dL (ref 12.0–15.0)
LYMPHS ABS: 3.2 10*3/uL (ref 0.7–4.0)
Lymphocytes Relative: 30.5 % (ref 12.0–46.0)
MCHC: 33.7 g/dL (ref 30.0–36.0)
MCV: 82.2 fl (ref 78.0–100.0)
MONOS PCT: 6.6 % (ref 3.0–12.0)
Monocytes Absolute: 0.7 10*3/uL (ref 0.1–1.0)
NEUTROS ABS: 6.5 10*3/uL (ref 1.4–7.7)
Neutrophils Relative %: 61 % (ref 43.0–77.0)
PLATELETS: 267 10*3/uL (ref 150.0–400.0)
RBC: 4.92 Mil/uL (ref 3.87–5.11)
RDW: 12.9 % (ref 11.5–15.5)
WBC: 10.6 10*3/uL — ABNORMAL HIGH (ref 4.0–10.5)

## 2014-09-29 LAB — POCT URINALYSIS DIPSTICK
Bilirubin, UA: NEGATIVE
Blood, UA: NEGATIVE
Glucose, UA: NEGATIVE
KETONES UA: NEGATIVE
Leukocytes, UA: NEGATIVE
Nitrite, UA: NEGATIVE
Protein, UA: NEGATIVE
SPEC GRAV UA: 1.015
Urobilinogen, UA: 0.2
pH, UA: 7

## 2014-09-29 LAB — COMPREHENSIVE METABOLIC PANEL
ALK PHOS: 43 U/L (ref 39–117)
ALT: 21 U/L (ref 0–35)
AST: 18 U/L (ref 0–37)
Albumin: 4.3 g/dL (ref 3.5–5.2)
BUN: 9 mg/dL (ref 6–23)
CHLORIDE: 101 meq/L (ref 96–112)
CO2: 29 meq/L (ref 19–32)
Calcium: 9.5 mg/dL (ref 8.4–10.5)
Creatinine, Ser: 0.74 mg/dL (ref 0.40–1.20)
GFR: 92.42 mL/min (ref >60.00)
GLUCOSE: 97 mg/dL (ref 70–99)
POTASSIUM: 4.4 meq/L (ref 3.5–5.1)
Sodium: 135 mEq/L (ref 135–145)
Total Bilirubin: 0.5 mg/dL (ref 0.2–1.2)
Total Protein: 7.4 g/dL (ref 6.0–8.3)

## 2014-09-29 LAB — H. PYLORI ANTIBODY, IGG: H Pylori IgG: NEGATIVE

## 2014-09-29 LAB — POCT URINE PREGNANCY: Preg Test, Ur: NEGATIVE

## 2014-09-29 LAB — LIPASE: Lipase: 46 U/L (ref 11.0–59.0)

## 2014-09-29 MED ORDER — DOXYCYCLINE HYCLATE 100 MG PO CAPS: 100.0000 mg | ORAL_CAPSULE | Freq: Two times a day (BID) | ORAL | Status: DC

## 2014-09-29 MED ORDER — ONDANSETRON HCL 4 MG PO TABS: 4.0000 mg | ORAL_TABLET | Freq: Three times a day (TID) | ORAL | Status: DC | PRN

## 2014-09-29 NOTE — Patient Instructions (Addendum)
I have sent a prescription to the pharmacy for Doxycycline. Please take this twice a day for 10 days. I have also sent a prescription to the pharmacy for Zofran, this should take care of your nausea.   Go to the lab and have your blood drawn. I will follow up with you regarding your blood work. You can go to   [facility] at 2:40 today for your three pm ultrasound appointment.    Follow up with PCP in 1-2 weeks or sooner if needed.    Sinusitis Sinusitis is redness, soreness, and puffiness (inflammation) of the air pockets in the bones of your face (sinuses). The redness, soreness, and puffiness can cause air and mucus to get trapped in your sinuses. This can allow germs to grow and cause an infection.  HOME CARE   Drink enough fluids to keep your pee (urine) clear or pale yellow.  Use a humidifier in your home.  Run a hot shower to create steam in the bathroom. Sit in the bathroom with the door closed. Breathe in the steam 3-4 times a day.  Put a warm, moist washcloth on your face 3-4 times a day, or as told by your doctor.  Use salt water sprays (saline sprays) to wet the thick fluid in your nose. This can help the sinuses drain.  Only take medicine as told by your doctor. GET HELP RIGHT AWAY IF:   Your pain gets worse.  You have very bad headaches.  You are sick to your stomach (nauseous).  You throw up (vomit).  You are very sleepy (drowsy) all the time.  Your face is puffy (swollen).  Your vision changes.  You have a stiff neck.  You have trouble breathing. MAKE SURE YOU:   Understand these instructions.  Will watch your condition.  Will get help right away if you are not doing well or get worse. Document Released: 10/03/2007 Document Revised: 01/09/2012 Document Reviewed: 11/20/2011 [facility] Patient Information 2015 [place], [place]. This information is not intended to replace advice given to you by your health care provider. Make sure you discuss any questions  you have with your health care provider.

## 2014-09-29 NOTE — Progress Notes (Signed)
Subjective:    Patient ID: Chelsey Sanford, female    DOB: 12/11/1975, 39 y.o.   MRN: [id number]  HPI  Patient presents to the office today with 2 separate problems.  Fair she presents with sinus pain/pressure with a thick green mucus sinus drainage and cough productive cough for 2 weeks. He does have associated low-grade fever up to 100F at home. Per patient over the course of the last 2 weeks the sinus issue became worse but over the last few days has been getting better. Currently she continues to have sinus pain and pressure but the drainage and mucus are now clear. Continues to have low-grade fevers. Does have a history of chronic sinus infections.  Her second and more concerning issue is lower abdominal pain for the last 2 weeks. Guarding approximately 2 weeks ago she had intermittent upper abdominal pain, since last Thursday every day she has woken up with abdominal pain. The pain is described as achy. It is located in the upper gastric region. She does have associated nausea but denies any vomiting. She is not constipated nor has diarrhea but states that she has been having more bowel movements than what is normal. She does state, that the pain has been on occasion better after eating.  Last menstrual period was 2 weeks ago. Currently on birth control pills  Review of Systems  Constitutional: Positive for fever, activity change and appetite change. Negative for diaphoresis, fatigue and unexpected weight change.  HENT: Positive for congestion, postnasal drip, rhinorrhea and sinus pressure. Negative for ear discharge, ear pain, sore throat and trouble swallowing.   Eyes: Negative for pain and redness.  Respiratory: Positive for cough. Negative for shortness of breath.   Cardiovascular: Negative for chest pain and palpitations.  Gastrointestinal: Positive for nausea and abdominal pain. Negative for vomiting, diarrhea, constipation, blood in stool and abdominal distention.  Musculoskeletal:  Negative for myalgias, back pain and joint swelling.  Neurological: Negative for dizziness, weakness and light-headedness.   Past Medical History  Diagnosis Date  . ALLERGIC RHINITIS   . ANEMIA-NOS   . ENDOMETRIOSIS   . Lumbar disc disease   . DYSAUTONOMIA   . Headache(784.0)   . Irritable bowel syndrome   . Anxiety   . Delayed gastric emptying     dx at [facility] by gastric emptying study per patient  . GERD (gastroesophageal reflux disease) 10/06/2011  . POTS (postural orthostatic tachycardia syndrome)     History   Social History  . Marital Status: Married    Spouse Name: N/A  . Number of Children: 1  . Years of Education: N/A   Occupational History  . Property Management    Social History Main Topics  . Smoking status: Former Smoker    Quit date: 04/30/1996  . Smokeless tobacco: Never Used  . Alcohol Use: Yes     Comment: Occasional wine  . Drug Use: No  . Sexual Activity: Yes    Birth Control/ Protection: Pill   Other Topics Concern  . 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 Ankle reconstruction  1993  . Tonsillectomy  1996  . Wisdom tooth extraction      Family History  Problem Relation Age of Onset  . Thyroid disease Mother     hypothyroidism  . Migraines Mother   . COPD Neg Hx   . Colon cancer Neg Hx   . Colon polyps Mother   . Skin cancer Mother   . Crohn's disease Maternal Grandmother     Allergies  Allergen Reactions  . Adhesive [Tape] Anaphylaxis and Rash  . Sulfonamide Derivatives Nausea And Vomiting  . Amoxicillin Nausea And Vomiting and Rash  . Erythromycin Nausea And Vomiting and Rash  . Latex Rash  . Penicillins Nausea And Vomiting and Rash    Current Outpatient  Prescriptions on File Prior to Visit  Medication Sig Dispense Refill  . citalopram (CELEXA) 20 MG tablet Take 1 tablet (20 mg total) by mouth daily. 90 tablet 1  . cyanocobalamin (,VITAMIN B-12,) 1000 MCG/ML injection Inject 1 mL (1,000 mcg total) into the muscle every 30 (thirty) days. 10 mL 0  . cyclobenzaprine (FLEXERIL) 10 MG tablet Take 1 tablet (10 mg total) by mouth 2 (two) times daily as needed for muscle spasms. 20 tablet 0  . docusate sodium (COLACE) 100 MG capsule Take 100 mg by mouth daily.     . drospirenone-ethinyl estradiol (YASMIN,ZARAH,SYEDA) 3-0.03 MG tablet Take 1 tablet by mouth every evening.    . iron polysaccharides (NU-IRON) 150 MG capsule Take 150 mg by mouth daily.      Marland Kitchen MAGOX 400 400 (241.3 MG) MG tablet TAKE 1 TABLET BY MOUTH EVERY DAY 30 tablet 5  . meloxicam (MOBIC) 7.5 MG tablet Take 7.5 mg by mouth daily as needed for pain. For back pain    . pantoprazole (PROTONIX) 40 MG tablet TAKE 1 TABLET BY MOUTH DAILY 30 tablet 5  . pindolol (VISKEN) 5 MG tablet Take 1 tablet (5 mg total) by mouth daily. 30 tablet 6  . Probiotic Product (ALIGN) 4 MG CAPS Take 4 mg by mouth daily.     . pseudoephedrine (SUDAFED) 30 MG tablet Take 30 mg by mouth daily as needed for congestion. For nasal congestion    . pyridostigmine (MESTINON) 60 MG tablet TAKE 1 TABLET BY MOUTH TWICE DAILY 60 tablet 0  . rizatriptan (MAXALT) 10 MG tablet TAKE 1 TABLET BY MOUTH AS NEEDED FOR MIGRAINE. MAY REPEAT IN 2 HOURS IF NEEDED. MAXIMUM OF 30 MG IN 24 HOURS. 12 tablet 0  . sodium chloride irrigation (CURITY STERILE SALINE) 0.9 % irrigation Irrigate with 1,000-2,000 mLs as directed once a week. On Sundays - based on hydration levels 500 mL 11  . SUMAtriptan-naproxen (TREXIMET) 85-500 MG per tablet Take 1 tablet by mouth every 2 (two) hours as needed for migraine. 10 tablet 0  . triamcinolone (NASACORT AQ) 55 MCG/ACT nasal inhaler Place 2 sprays into both nostrils daily as needed (Patient taking 2 sprays in  each nare daily as need for seasonal alllergies). For stuffiness    . [DISCONTINUED] magnesium oxide (MAG-OX) 400 MG tablet Take 1 tablet (400 mg total) by mouth daily. 30 tablet 5   No current facility-administered medications on file prior to visit.    BP 126/86 mmHg  Temp(Src) 98.4 F (36.9 C) (Oral)  Wt 171 lb 6.4 oz (77.747 kg)        Objective:   Physical Exam  Constitutional: She is oriented to person, place, and time. She appears well-developed and well-nourished. No distress.  HENT:  Head: Normocephalic and atraumatic.  Right Ear: External ear normal.  Left Ear: External ear normal.  Nose: Nose normal.  Mouth/Throat: Oropharynx is clear and moist. No oropharyngeal exudate.  No cerumen in bilateral ear canals.   Eyes: Right eye exhibits no discharge. Left eye exhibits no discharge.  Cardiovascular: Normal rate, regular rhythm, normal heart sounds and intact distal pulses.  Exam reveals no gallop and no friction rub.   No murmur heard. Pulmonary/Chest: Effort normal and breath sounds normal. No respiratory distress. She has no wheezes. She has no rales. She exhibits no tenderness.  Abdominal: Soft. Bowel sounds are normal. She exhibits no distension and no mass. There is tenderness (right upper quadrant with palpation. More so over gallbladder). There is no rebound and no guarding.  Musculoskeletal: Normal range of motion. She exhibits no edema.  Lymphadenopathy:    She has no cervical adenopathy.  Neurological: She is alert and oriented to person, place, and time. No cranial nerve deficit. Coordination normal.  Skin: Skin is warm and dry. No rash noted. She is not diaphoretic. No erythema. No pallor.  Psychiatric: She has a normal mood and affect. Her behavior is normal. Judgment and thought content normal.  Vitals reviewed.       Assessment & Plan:  1. Acute frontal sinusitis, recurrence not specified - doxycycline (VIBRAMYCIN) 100 MG capsule; Take 1 capsule (100  mg total) by mouth 2 (two) times daily.  Dispense: 20 capsule; Refill: 0 - Follow up if no improvement in the next 2-3 days.   2. Generalized abdominal pain - Possible gall bladder issue or gastric ulcer. Unlikely appendicitis or aneurysm.  - ondansetron (ZOFRAN) 4 MG tablet; Take 1 tablet (4 mg total) by mouth every 8 (eight) hours as needed for nausea or vomiting.  Dispense: 20 tablet; Refill: 0 - US Abdomen Complete; Future - CBC with Differential/Platelet - Comprehensive metabolic panel - H. pylori antibody, IgG - Lipase - POCT urine pregnancy - POCT urinalysis dipstick; Standing - POCT urinalysis dipstick - Will follow up with once results of labs and [place] are obtained. - She declined CT at this time.  - Follow up with PCP in 1-2 weeks or sooner if needed.

## 2014-09-29 NOTE — Addendum Note (Signed)
Addended by: Colleen Can on: 09/29/2014 04:12 PM   Modules accepted: Orders

## 2014-09-29 NOTE — Progress Notes (Signed)
Pre visit review using our clinic review tool, if applicable. No additional management support is needed unless otherwise documented below in the visit note. 

## 2014-09-29 NOTE — Telephone Encounter (Signed)
Attempted to call patient. Left VM to call back.  

## 2014-09-30 ENCOUNTER — Telehealth: Payer: Self-pay | Admitting: Internal Medicine

## 2014-09-30 NOTE — Telephone Encounter (Signed)
Spoke to patient on phone and updated on labs and [place] reading. Advised GI consult. She would like to see if feeling any better once ABX treatment for sinus infection is completed.

## 2014-09-30 NOTE — Telephone Encounter (Signed)
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

## 2014-10-04 ENCOUNTER — Telehealth: Payer: Self-pay | Admitting: Internal Medicine

## 2014-10-04 NOTE — Telephone Encounter (Signed)
[facility] - Client [facility] Patient Name: Chelsey Sanford DOB: [birth year] Initial Comment Caller states was put on ABX last week for sinus infection. started getting nauseated when taking it then last night and this morning vomiting after taking ABX doxycycline Nurse Assessment Nurse: Donalynn Furlong, RN, Myna Hidalgo Date/Time Eilene Ghazi Time): 10/04/2014 12:14:28 PM Confirm and document reason for call. If symptomatic, describe symptoms. ---Caller states was put on ABX last week for sinus infection. started getting nauseated when taking it then last night and this morning vomiting after taking ABX doxycycline (started it last week)Afebrile. Pt states she has 6 more days left and feels she cannot continue given the nausea. Pt feels sinus infection is currently much better. Has the patient traveled out of the country within the last 30 days? ---No Does the patient require triage? ---Yes Related visit to physician within the last 2 weeks? ---Yes Does the PT have any chronic conditions? (i.e. diabetes, asthma, etc.) ---Yes List chronic conditions. ---Autonomic Nervous Disorder/POTS Did the patient indicate they were pregnant? ---No Guidelines Guideline Title Affirmed Question Affirmed Notes Medication Question Call Caller has NON-URGENT medication question about med that PCP prescribed and triager unable to answer question Final Disposition User Call PCP within 24 Hours [place], RN, Myna Hidalgo

## 2014-10-04 NOTE — Telephone Encounter (Signed)
Spoke to patient on the phone. She started taking Doxycycline on 09/29/2014- for sinus pain, has been feeling nauseated since then. Her sinus infection is clearing up and her stomach pain is diminishing. She started vomiting yesterday about 30 minutes after taking medication.   [place] for patient to stop ABX, will see if she continues to feel better.

## 2014-10-04 NOTE — Telephone Encounter (Signed)
Patient was seen @ [facility] for this because of access. She feels as though she is having allergic reaction to doxycycline (VIBRAMYCIN) 100 MG capsule [[id number]] . She is asking for an alternative antibiotic OR approval to stop taking this med because of intense nausea. Please call the patient back to advise.

## 2014-10-08 ENCOUNTER — Telehealth: Payer: Self-pay | Admitting: Internal Medicine

## 2014-10-08 ENCOUNTER — Encounter: Payer: Self-pay | Admitting: Nurse Practitioner

## 2014-10-08 ENCOUNTER — Ambulatory Visit (INDEPENDENT_AMBULATORY_CARE_PROVIDER_SITE_OTHER): Payer: BLUE CROSS/BLUE SHIELD | Admitting: Nurse Practitioner

## 2014-10-08 VITALS — BP 130/92 | HR 73 | Ht 66.0 in | Wt 169.2 lb

## 2014-10-08 DIAGNOSIS — I951 Orthostatic hypotension: Secondary | ICD-10-CM

## 2014-10-08 DIAGNOSIS — R030 Elevated blood-pressure reading, without diagnosis of hypertension: Secondary | ICD-10-CM | POA: Diagnosis not present

## 2014-10-08 DIAGNOSIS — I498 Other specified cardiac arrhythmias: Secondary | ICD-10-CM

## 2014-10-08 DIAGNOSIS — R51 Headache: Secondary | ICD-10-CM

## 2014-10-08 DIAGNOSIS — R000 Tachycardia, unspecified: Secondary | ICD-10-CM | POA: Diagnosis not present

## 2014-10-08 DIAGNOSIS — IMO0001 Reserved for inherently not codable concepts without codable children: Secondary | ICD-10-CM

## 2014-10-08 DIAGNOSIS — R519 Headache, unspecified: Secondary | ICD-10-CM

## 2014-10-08 DIAGNOSIS — G90A Postural orthostatic tachycardia syndrome (POTS): Secondary | ICD-10-CM

## 2014-10-08 MED ORDER — RIZATRIPTAN BENZOATE 10 MG PO TABS: ORAL_TABLET | ORAL | Status: DC

## 2014-10-08 NOTE — Patient Instructions (Addendum)
We will be checking the following labs today - NONE  Medication Instructions:    Continue with your current medicines.   Try the Maxalt - I have refilled this today.   Limit your use of decongestants    Testing/Procedures To Be Arranged:  N/A  Follow-Up:   See Dr. Harrington Challenger as planned.     Other Special Instructions:   N/A  Call the [place] office at [phone] if you have any questions, problems or concerns.

## 2014-10-08 NOTE — Telephone Encounter (Signed)
Patient actually called in last night 7 PM Complained for feeling weak/ drained. BP 157/104  P 80s   Headache diffuse.  Recomm she take 2.5 mg pindolol   Call back  She called  BP better 146/84  Still tired Told her to go to bed Would call in AM NO visual disturbances   AM  6/10  Called pt  Still tired  HA on L side of head and neck Told her I would make appt to be seen today  Get BP and orthostatics   Note she told me she had sinus infection last week  Seen in IM   Took 2 days of Doxy   Couldn't tolerate with Nausea  Stopped  Says nasal d/c now is clear.

## 2014-10-08 NOTE — Progress Notes (Signed)
CARDIOLOGY OFFICE NOTE  Date:  10/08/2014    Chelsey Sanford Date of Birth: 1975/12/29 Medical Record #[id number]  PCP:  Gwendolyn Grant, MD  Cardiologist:  Harrington Challenger    Chief Complaint  Patient presents with  . Hypertension    Work in visit - seen for Dr. Harrington Challenger.     History of Present Illness: Chelsey Sanford is a 39 y.o. female who presents today for a work in visit. Seen for Dr. Harrington Challenger. She has a history of POTS. She is on Mestinon. She has received IV fluids on an every 2 week schedule for some time.   She was last seen here in March. Seemed to be doing ok.   Phone call from yesterday. BP elevated at 157/104.   She was fatigued - discussed going to the ER with Dr. Harrington Challenger who deferred her to come here today instead. 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  Was instructed to take her Pindolol last night and again today. Has had recent sinus infection - treated with Doxy but did not tolerate due to N/V. This has now resolved. Has had recent abdominal ultrasound that is negative for gallbladder issues.   Comes in today. Here alone. She notes that that she has not felt great for several weeks - she has been nauseated. She has gotten better over the day or so - but felt bad again last night. She notes that her head hurts - more on the left side of her head. Has had some visual disturbance. Bp has improved. Does not have her maxalt and has been using more NSAID. She has no more nasal congestion. No fever or chills.    Past Medical History  Diagnosis Date  . ALLERGIC RHINITIS   . ANEMIA-NOS   . ENDOMETRIOSIS   . Lumbar disc disease   . DYSAUTONOMIA   . Headache(784.0)   . Irritable bowel syndrome   . Anxiety   . Delayed gastric emptying     dx at [facility] by gastric emptying study per patient  . GERD (gastroesophageal reflux disease) 10/06/2011  . POTS (postural orthostatic tachycardia syndrome)     Past Surgical History  Procedure Laterality Date  . Ankle reconstruction   1993  . Tonsillectomy  1996  . Wisdom tooth extraction       Medications: Current Outpatient Prescriptions  Medication Sig Dispense Refill  . citalopram (CELEXA) 20 MG tablet Take 1 tablet (20 mg total) by mouth daily. 90 tablet 1  . cyanocobalamin (,VITAMIN B-12,) 1000 MCG/ML injection Inject 1 mL (1,000 mcg total) into the muscle every 30 (thirty) days. 10 mL 0  . cyclobenzaprine (FLEXERIL) 10 MG tablet Take 1 tablet (10 mg total) by mouth 2 (two) times daily as needed for muscle spasms. 20 tablet 0  . docusate sodium (COLACE) 100 MG capsule Take 100 mg by mouth daily.     . drospirenone-ethinyl estradiol (YASMIN,ZARAH,SYEDA) 3-0.03 MG tablet Take 1 tablet by mouth every evening.    . iron polysaccharides (NU-IRON) 150 MG capsule Take 150 mg by mouth daily.      Marland Kitchen MAGOX 400 400 (241.3 MG) MG tablet TAKE 1 TABLET BY MOUTH EVERY DAY 30 tablet 5  . meloxicam (MOBIC) 7.5 MG tablet Take 7.5 mg by mouth daily as needed for pain. For back pain    . ondansetron (ZOFRAN) 4 MG tablet Take 1 tablet (4 mg total) by mouth every 8 (eight) hours as needed for nausea or  vomiting. 27 tablet 0  . pantoprazole (PROTONIX) 40 MG tablet TAKE 1 TABLET BY MOUTH DAILY 30 tablet 5  . pindolol (VISKEN) 5 MG tablet Take 1 tablet (5 mg total) by mouth daily. (Patient taking differently: Take 2.5 mg by mouth daily. ) 30 tablet 6  . Probiotic Product (ALIGN) 4 MG CAPS Take 4 mg by mouth daily.     . pseudoephedrine (SUDAFED) 30 MG tablet Take 30 mg by mouth daily as needed for congestion. For nasal congestion    . pyridostigmine (MESTINON) 60 MG tablet TAKE 1 TABLET BY MOUTH TWICE DAILY 60 tablet 0  . rizatriptan (MAXALT) 10 MG tablet TAKE 1 TABLET BY MOUTH AS NEEDED FOR MIGRAINE. MAY REPEAT IN 2 HOURS IF NEEDED. MAXIMUM OF 30 MG IN 24 HOURS. 12 tablet 1  . sodium chloride irrigation (CURITY STERILE SALINE) 0.9 % irrigation Irrigate with 1,000-2,000 mLs as directed once a week. On Sundays - based on hydration levels 500  mL 11  . SUMAtriptan-naproxen (TREXIMET) 85-500 MG per tablet Take 1 tablet by mouth every 2 (two) hours as needed for migraine. 10 tablet 0  . triamcinolone (NASACORT AQ) 55 MCG/ACT nasal inhaler Place 2 sprays into both nostrils daily as needed (Patient taking 2 sprays in each nare daily as need for seasonal alllergies). For stuffiness    . [DISCONTINUED] magnesium oxide (MAG-OX) 400 MG tablet Take 1 tablet (400 mg total) by mouth daily. 30 tablet 5   No current facility-administered medications for this visit.    Allergies: Allergies  Allergen Reactions  . Adhesive [Tape] Anaphylaxis and Rash  . Sulfonamide Derivatives Nausea And Vomiting  . Doxycycline Nausea And Vomiting  . Amoxicillin Nausea And Vomiting and Rash  . Erythromycin Nausea And Vomiting and Rash  . Latex Rash  . Penicillins Nausea And Vomiting and Rash    Social History: The patient  reports that she quit smoking about 18 years ago. She has never used smokeless tobacco. She reports that she drinks alcohol. She reports that she does not use illicit drugs.   Family History: The patient's family history includes Colon polyps in her mother; Crohn's disease in her maternal grandmother; Migraines in her mother; Skin cancer in her mother; Thyroid disease in her mother. There is no history of COPD or Colon cancer.   Review of Systems: Please see the history of present illness.   Otherwise, the review of systems is positive for none.   All other systems are reviewed and negative.   Physical Exam: VS:  BP 130/92 mmHg  Pulse 73  Ht 5\' 6"  (1.676 m)  Wt 169 lb 3.2 oz (76.749 kg)  BMI 27.32 kg/m2 .  BMI Body mass index is 27.32 kg/(m^2).   Lying BP is 118/90 with HR 68. Sitting BP is 118/90 with HR 76 Standing BP is 120/90 with HR 88.  BP by me is down to 110/70.  Wt Readings from Last 3 Encounters:  10/08/14 169 lb 3.2 oz (76.749 kg)  09/29/14 171 lb 6.4 oz (77.747 kg)  07/16/14 176 lb (79.833 kg)    General:  Pleasant. Well developed, well nourished and in no acute distress.  HEENT: Normal. Neck: Supple, no JVD, carotid bruits, or masses noted.  Cardiac: Regular rate and rhythm. No murmurs, rubs, or gallops. No edema.  Respiratory:  Lungs are clear to auscultation bilaterally with normal work of breathing.  GI: Soft and nontender.  MS: No deformity or atrophy. Gait and ROM intact. Skin: Warm and dry. Color  is normal.  Neuro:  Strength and sensation are intact and no gross focal deficits noted.  Psych: Alert, appropriate and with normal affect.   LABORATORY DATA:  EKG:  EKG is ordered today. This demonstrates NSR.  Lab Results  Component Value Date   WBC 10.6* 09/29/2014   HGB 13.6 09/29/2014   HCT 40.4 09/29/2014   PLT 267.0 09/29/2014   GLUCOSE 97 09/29/2014   ALT 21 09/29/2014   AST 18 09/29/2014   NA 135 09/29/2014   K 4.4 09/29/2014   CL 101 09/29/2014   CREATININE 0.74 09/29/2014   BUN 9 09/29/2014   CO2 29 09/29/2014   TSH 2.57 07/16/2014   INR 0.9 09/14/2011    BNP (last 3 results) No results for input(s): BNP in the last 8760 hours.  ProBNP (last 3 results) No results for input(s): PROBNP in the last 8760 hours.   Other Studies Reviewed Today:  N/A  Assessment/Plan:  1. Elevated BP - recheck by me is improved. I am wondering if her BP is elevated due to her head hurting and not the other way around. Sounds like she has a migraine - I have talked with Dr. Harrington Challenger - she is ok with me refilling Maxalt today. I have asked Lannah to take the [place]. Monitor the BP. Limit her use of NSAID for now. I do not think she needs antibiotics at this time.   2. POTS  Current medicines are reviewed with the patient today.  The patient does not have concerns regarding medicines other than what has been noted above.  The following changes have been made:  See above.  Labs/ tests ordered today include:    Orders Placed This Encounter  Procedures  . EKG 12-Lead      Disposition:   FU with Dr. Harrington Challenger as planned.    Patient is agreeable to this plan and will call if any problems develop in the interim.   Signed: Burtis Junes, RN, ANP-C 10/08/2014 3:03 PM  [facility] [address] [place], [place]  [address] Phone: [phone] Fax: [phone]

## 2014-11-05 ENCOUNTER — Other Ambulatory Visit: Payer: Self-pay | Admitting: Internal Medicine

## 2014-11-12 ENCOUNTER — Other Ambulatory Visit: Payer: Self-pay | Admitting: Internal Medicine

## 2014-11-12 NOTE — Telephone Encounter (Signed)
Ok to refill 

## 2014-11-12 NOTE — Telephone Encounter (Signed)
OK to refill

## 2014-12-10 ENCOUNTER — Other Ambulatory Visit: Payer: Self-pay

## 2014-12-10 DIAGNOSIS — Z1231 Encounter for screening mammogram for malignant neoplasm of breast: Secondary | ICD-10-CM

## 2014-12-12 ENCOUNTER — Other Ambulatory Visit: Payer: Self-pay | Admitting: Internal Medicine

## 2014-12-30 LAB — HM PAP SMEAR: HM Pap smear: NEGATIVE

## 2015-01-05 ENCOUNTER — Other Ambulatory Visit: Payer: Self-pay | Admitting: Obstetrics and Gynecology

## 2015-01-05 DIAGNOSIS — R928 Other abnormal and inconclusive findings on diagnostic imaging of breast: Secondary | ICD-10-CM

## 2015-01-10 ENCOUNTER — Other Ambulatory Visit: Payer: Self-pay

## 2015-01-10 ENCOUNTER — Ambulatory Visit
Admission: RE | Admit: 2015-01-10 | Discharge: 2015-01-10 | Disposition: A | Discharge status: Home / Self Care | Payer: BLUE CROSS/BLUE SHIELD | Source: Ambulatory Visit | Attending: Obstetrics and Gynecology | Admitting: Obstetrics and Gynecology

## 2015-01-10 DIAGNOSIS — N6310 Unspecified lump in the right breast, unspecified quadrant: Secondary | ICD-10-CM

## 2015-01-10 DIAGNOSIS — R928 Other abnormal and inconclusive findings on diagnostic imaging of breast: Secondary | ICD-10-CM

## 2015-01-12 ENCOUNTER — Ambulatory Visit
Admission: RE | Admit: 2015-01-12 | Discharge: 2015-01-12 | Disposition: A | Discharge status: Home / Self Care | Payer: BLUE CROSS/BLUE SHIELD | Source: Ambulatory Visit

## 2015-01-12 ENCOUNTER — Other Ambulatory Visit: Payer: Self-pay

## 2015-01-12 DIAGNOSIS — N6310 Unspecified lump in the right breast, unspecified quadrant: Secondary | ICD-10-CM

## 2015-01-12 HISTORY — PX: BREAST BIOPSY: SHX20

## 2015-01-14 ENCOUNTER — Other Ambulatory Visit: Payer: BLUE CROSS/BLUE SHIELD

## 2015-01-19 ENCOUNTER — Ambulatory Visit: Payer: BLUE CROSS/BLUE SHIELD

## 2015-01-21 ENCOUNTER — Ambulatory Visit: Payer: BLUE CROSS/BLUE SHIELD

## 2015-03-08 ENCOUNTER — Other Ambulatory Visit: Payer: Self-pay | Admitting: Nurse Practitioner

## 2015-03-08 ENCOUNTER — Other Ambulatory Visit: Payer: Self-pay | Admitting: Internal Medicine

## 2015-03-08 NOTE — Telephone Encounter (Signed)
Please have pharmacy to ask PCP to fill.

## 2015-03-14 ENCOUNTER — Other Ambulatory Visit: Payer: Self-pay | Admitting: *Deleted

## 2015-03-14 MED ORDER — RIZATRIPTAN BENZOATE 10 MG PO TABS: ORAL_TABLET | ORAL | Status: DC

## 2015-04-05 ENCOUNTER — Other Ambulatory Visit: Payer: Self-pay | Admitting: Obstetrics and Gynecology

## 2015-04-05 DIAGNOSIS — N6310 Unspecified lump in the right breast, unspecified quadrant: Secondary | ICD-10-CM

## 2015-04-08 ENCOUNTER — Other Ambulatory Visit: Payer: Self-pay | Admitting: Internal Medicine

## 2015-04-19 ENCOUNTER — Ambulatory Visit: Payer: BLUE CROSS/BLUE SHIELD | Admitting: Internal Medicine

## 2015-05-16 ENCOUNTER — Ambulatory Visit (INDEPENDENT_AMBULATORY_CARE_PROVIDER_SITE_OTHER): Payer: BLUE CROSS/BLUE SHIELD | Admitting: Internal Medicine

## 2015-05-16 ENCOUNTER — Other Ambulatory Visit (INDEPENDENT_AMBULATORY_CARE_PROVIDER_SITE_OTHER): Payer: BLUE CROSS/BLUE SHIELD

## 2015-05-16 ENCOUNTER — Encounter: Payer: Self-pay | Admitting: Internal Medicine

## 2015-05-16 VITALS — BP 134/88 | HR 74 | Temp 98.1°F | Resp 16 | Wt 178.0 lb

## 2015-05-16 DIAGNOSIS — R000 Tachycardia, unspecified: Secondary | ICD-10-CM

## 2015-05-16 DIAGNOSIS — K219 Gastro-esophageal reflux disease without esophagitis: Secondary | ICD-10-CM | POA: Diagnosis not present

## 2015-05-16 DIAGNOSIS — G43809 Other migraine, not intractable, without status migrainosus: Secondary | ICD-10-CM

## 2015-05-16 DIAGNOSIS — G90A Postural orthostatic tachycardia syndrome (POTS): Secondary | ICD-10-CM

## 2015-05-16 DIAGNOSIS — G909 Disorder of the autonomic nervous system, unspecified: Secondary | ICD-10-CM

## 2015-05-16 DIAGNOSIS — G901 Familial dysautonomia [Riley-Day]: Secondary | ICD-10-CM

## 2015-05-16 DIAGNOSIS — E538 Deficiency of other specified B group vitamins: Secondary | ICD-10-CM

## 2015-05-16 DIAGNOSIS — I951 Orthostatic hypotension: Secondary | ICD-10-CM

## 2015-05-16 LAB — CBC WITH DIFFERENTIAL/PLATELET
Basophils Absolute: 0.1 10*3/uL (ref 0.0–0.1)
Basophils Relative: 0.6 % (ref 0.0–3.0)
EOS PCT: 1.2 % (ref 0.0–5.0)
Eosinophils Absolute: 0.1 10*3/uL (ref 0.0–0.7)
HEMATOCRIT: 40.9 % (ref 36.0–46.0)
Hemoglobin: 13.7 g/dL (ref 12.0–15.0)
LYMPHS ABS: 3.8 10*3/uL (ref 0.7–4.0)
LYMPHS PCT: 35.5 % (ref 12.0–46.0)
MCHC: 33.5 g/dL (ref 30.0–36.0)
MCV: 81.6 fl (ref 78.0–100.0)
MONOS PCT: 6.7 % (ref 3.0–12.0)
Monocytes Absolute: 0.7 10*3/uL (ref 0.1–1.0)
Neutro Abs: 6 10*3/uL (ref 1.4–7.7)
Neutrophils Relative %: 56 % (ref 43.0–77.0)
Platelets: 266 10*3/uL (ref 150.0–400.0)
RBC: 5.01 Mil/uL (ref 3.87–5.11)
RDW: 12.7 % (ref 11.5–15.5)
WBC: 10.7 10*3/uL — ABNORMAL HIGH (ref 4.0–10.5)

## 2015-05-16 LAB — COMPREHENSIVE METABOLIC PANEL
ALK PHOS: 45 U/L (ref 39–117)
ALT: 13 U/L (ref 0–35)
AST: 16 U/L (ref 0–37)
Albumin: 4.3 g/dL (ref 3.5–5.2)
BUN: 9 mg/dL (ref 6–23)
CALCIUM: 9.5 mg/dL (ref 8.4–10.5)
CO2: 25 mEq/L (ref 19–32)
Chloride: 102 mEq/L (ref 96–112)
Creatinine, Ser: 0.72 mg/dL (ref 0.40–1.20)
GFR: 95.09 mL/min (ref >60.00)
GLUCOSE: 88 mg/dL (ref 70–99)
POTASSIUM: 4.2 meq/L (ref 3.5–5.1)
Sodium: 140 mEq/L (ref 135–145)
TOTAL PROTEIN: 7.4 g/dL (ref 6.0–8.3)
Total Bilirubin: 0.6 mg/dL (ref 0.2–1.2)

## 2015-05-16 LAB — TSH: TSH: 1.84 u[IU]/mL (ref 0.35–4.50)

## 2015-05-16 LAB — VITAMIN B12: Vitamin B-12: 308 pg/mL (ref 211–911)

## 2015-05-16 MED ORDER — MAGNESIUM OXIDE 400 (241.3 MG) MG PO TABS: 1.0000 | ORAL_TABLET | Freq: Every day | ORAL | Status: DC

## 2015-05-16 NOTE — Progress Notes (Signed)
Pre visit review using our clinic review tool, if applicable. No additional management support is needed unless otherwise documented below in the visit note. 

## 2015-05-16 NOTE — Assessment & Plan Note (Signed)
Was receiving B12 injections with IV fluid infusions, but injections stopped. When fluids were discontinued Check vitamin B12 level May consider oral supplementation, but needs to monitor-?_Oral medication May need to restart monthly B12 injections

## 2015-05-16 NOTE — Assessment & Plan Note (Signed)
Triggers: menses, weather changes, sinus issues Takes flexeril as needed  Takes maxalt as needed

## 2015-05-16 NOTE — Assessment & Plan Note (Signed)
Related to IVF in past

## 2015-05-16 NOTE — Progress Notes (Signed)
Subjective:    Patient ID: Chelsey Sanford, female    DOB: 09-03-1975, 40 y.o.   MRN: [id number]  HPI She is here to establish with a new pcp. She is here for follow up of her chronic medical problems.   POTS, dysautonomia:  She follows with cardio ( Dr Harrington Challenger).  She takes pindolol daily.  She takes mestinon daily.  She takes celexa daily.  She is exercising regularly - walks the dog 3 miles 4-5 times a week.  She does not wear compression stockings. She denies edema and did not find the stockings helpful.  She maintains a high salt diet and drinks lots of water.  She has intermittent lightheadedness, but for the most part she feels that is well controlled.  Her biggest symptoms are GI related.  She has lower esophageal paralysis, bouts of constipation/diarrhea, gastroparesis.  She takes a lot of electrolyte tablets.  She works full time.  She does have a lot of muscle spasms, muscle fatigue, and she does not sleep well.    GERD: esophageal strictures, distal esophagus paralysis, gastroparesis:  Has had esophageus stretched.  She takes the protonix dialy   She denies GERD symptoms.    Migraine headaches:  She takes flexeril if needed for migraines and back pain.  She takes it infrequently.  She has been taking maxalt if needed.    B12 def:  When she got the infusions for saline for the POTS she would also get her B12 injections.  She wonders if she still needs it.  Her last B12 injection was months ago.  She would prefer to take oral medication if possible.   Medications and allergies reviewed with patient and updated if appropriate.  Patient Active Problem List   Diagnosis Date Noted  . Left arm swelling 04/29/2013  . Left forearm pain 04/29/2013  . Superficial phlebitis 04/29/2013  . Contact dermatitis 09/03/2012  . Migraine headache   . GERD (gastroesophageal reflux disease) 10/06/2011  . Vertigo 10/05/2011  . ANXIETY 05/03/2010  . ALLERGIC RHINITIS 05/03/2010  . ANEMIA-NOS 12/26/2007    . DYSAUTONOMIA 12/26/2007  . Irritable bowel syndrome 09/08/2007  . ENDOMETRIOSIS 07/21/2007    Current Outpatient Prescriptions on File Prior to Visit  Medication Sig Dispense Refill  . citalopram (CELEXA) 20 MG tablet Take 1 tablet (20 mg total) by mouth daily. Must establish with NEW PCP for additional refills. 90 tablet 0  . cyanocobalamin (,VITAMIN B-12,) 1000 MCG/ML injection Inject 1 mL (1,000 mcg total) into the muscle every 30 (thirty) days. 10 mL 0  . cyclobenzaprine (FLEXERIL) 10 MG tablet Take 1 tablet (10 mg total) by mouth 2 (two) times daily as needed for muscle spasms. 20 tablet 0  . docusate sodium (COLACE) 100 MG capsule Take 100 mg by mouth daily.     . drospirenone-ethinyl estradiol (YASMIN,ZARAH,SYEDA) 3-0.03 MG tablet Take 1 tablet by mouth every evening.    . iron polysaccharides (NU-IRON) 150 MG capsule Take 150 mg by mouth daily.      Marland Kitchen MAGOX 400 400 (241.3 MG) MG tablet TAKE 1 TABLET BY MOUTH EVERY DAY 30 tablet 5  . meloxicam (MOBIC) 7.5 MG tablet Take 7.5 mg by mouth daily as needed for pain. For back pain    . ondansetron (ZOFRAN) 4 MG tablet Take 1 tablet (4 mg total) by mouth every 8 (eight) hours as needed for nausea or vomiting. 27 tablet 0  . pantoprazole (PROTONIX) 40 MG tablet Take 1 tablet (40 mg total)  by mouth daily. Appt needed for additional refills. Must est with new PCP 90 tablet 0  . pindolol (VISKEN) 5 MG tablet TAKE 1 TABLET(5 MG) BY MOUTH DAILY 30 tablet 5  . Probiotic Product (ALIGN) 4 MG CAPS Take 4 mg by mouth daily.     . pseudoephedrine (SUDAFED) 30 MG tablet Take 30 mg by mouth daily as needed for congestion. For nasal congestion    . pyridostigmine (MESTINON) 60 MG tablet TAKE 1 TABLET BY MOUTH TWICE DAILY 60 tablet 5  . rizatriptan (MAXALT) 10 MG tablet TAKE 1 TABLET BY MOUTH AS NEEDED FOR MIGRAINE. MAY REPEAT IN 2 HOURS IF NEEDED. MAXIMUM OF 30 MG IN 24 HOURS. 12 tablet 1  . triamcinolone (NASACORT AQ) 55 MCG/ACT nasal inhaler Place 2  sprays into both nostrils daily as needed (Patient taking 2 sprays in each nare daily as need for seasonal alllergies). For stuffiness    . sodium chloride irrigation (CURITY STERILE SALINE) 0.9 % irrigation Irrigate with 1,000-2,000 mLs as directed once a week. On Sundays - based on hydration levels (Patient not taking: Reported on 05/16/2015) 500 mL 11  . [DISCONTINUED] magnesium oxide (MAG-OX) 400 MG tablet Take 1 tablet (400 mg total) by mouth daily. 30 tablet 5   No current facility-administered medications on file prior to visit.    Past Medical History  Diagnosis Date  . ALLERGIC RHINITIS   . ANEMIA-NOS   . ENDOMETRIOSIS   . Lumbar disc disease   . DYSAUTONOMIA   . Headache(784.0)   . Irritable bowel syndrome   . Anxiety   . Delayed gastric emptying     dx at [facility] by gastric emptying study per patient  . GERD (gastroesophageal reflux disease) 10/06/2011  . POTS (postural orthostatic tachycardia syndrome)     Past Surgical History  Procedure Laterality Date  . Ankle reconstruction  1993  . Tonsillectomy  1996  . Wisdom tooth extraction      Social History   Social History  . Marital Status: Married    Spouse Name: N/A  . Number of Children: 1  . Years of Education: N/A   Occupational History  . Property Management    Social History Main Topics  . Smoking status: Former Smoker    Quit date: 04/30/1996  . Smokeless tobacco: Never Used  . Alcohol Use: Yes     Comment: Occasional wine  . Drug Use: No  . Sexual Activity: Yes    Birth Control/ Protection: Pill   Other Topics Concern  . 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 Thyroid disease Mother     hypothyroidism  . Migraines Mother   . COPD Neg Hx   .  Colon cancer Neg Hx   . Colon polyps Mother   . Skin cancer Mother   . Crohn's disease Maternal Grandmother     Review of Systems  Constitutional: Negative for fever and chills.  Respiratory: Positive for shortness of breath (mild). Negative for cough and wheezing.   Cardiovascular: Positive for palpitations (with position changes). Negative for chest pain and leg swelling.  Gastrointestinal: Positive for nausea (occasional), abdominal pain (cramping, bloating), diarrhea (BM varies), constipation and blood in stool (  hemorrhoidal).       No GERD  Neurological: Positive for light-headedness (with position changes) and headaches.       Objective:   Filed Vitals:   05/16/15 1330  BP: 134/88  Pulse: 74  Temp: 98.1 F (36.7 C)  Resp: 16   Filed Weights   05/16/15 1330  Weight: 178 lb (80.74 kg)   Body mass index is 28.74 kg/(m^2).   Physical Exam Constitutional: Appears well-developed and well-nourished. No distress.  Neck: Neck supple. No tracheal deviation present. No thyromegaly present.  No carotid bruit. No cervical adenopathy.   Cardiovascular: Normal rate, regular rhythm and normal heart sounds.   No murmur heard.  No edema Pulmonary/Chest: Effort normal and breath sounds normal. No respiratory distress. No wheezes.  Psychiatric: Normal mood and affect        Assessment & Plan:   See Problem List for Assessment and Plan of chronic medical problems.  Follow-up for a physical exam at some point later this year

## 2015-05-16 NOTE — Assessment & Plan Note (Signed)
POTS, GI symptoms including gastroparesis, distal esophageal paralysis  Taking Mestinon, citalopram, pindolol

## 2015-05-16 NOTE — Patient Instructions (Signed)
  We have reviewed your prior records including labs and tests today.  Test(s) ordered today. Your results will be released to [facility] (or called to you) after review, usually within 72hours after test completion. If any changes need to be made, you will be notified at that same time.  Medications reviewed and updated.  No changes recommended at this time.  Please followup for a physical exam.

## 2015-05-16 NOTE — Assessment & Plan Note (Signed)
Following with cardiology Taking Mestinon, pindolol and celexa Exercises regularly High salt diet Maintains good hydration

## 2015-05-16 NOTE — Assessment & Plan Note (Signed)
Related to dysautonomia, distal esophageal paralysis No GERD symptoms Taking Protonix daily

## 2015-05-18 ENCOUNTER — Encounter: Payer: Self-pay | Admitting: Internal Medicine

## 2015-05-18 ENCOUNTER — Ambulatory Visit (INDEPENDENT_AMBULATORY_CARE_PROVIDER_SITE_OTHER): Payer: BLUE CROSS/BLUE SHIELD | Admitting: Internal Medicine

## 2015-05-18 VITALS — BP 122/84 | HR 97 | Temp 99.4°F | Resp 18 | Wt 180.0 lb

## 2015-05-18 DIAGNOSIS — J101 Influenza due to other identified influenza virus with other respiratory manifestations: Secondary | ICD-10-CM | POA: Diagnosis not present

## 2015-05-18 DIAGNOSIS — R509 Fever, unspecified: Secondary | ICD-10-CM | POA: Diagnosis not present

## 2015-05-18 DIAGNOSIS — J029 Acute pharyngitis, unspecified: Secondary | ICD-10-CM

## 2015-05-18 LAB — POCT INFLUENZA A/B
INFLUENZA A, POC: POSITIVE — AB
Influenza B, POC: NEGATIVE

## 2015-05-18 LAB — POCT RAPID STREP A (OFFICE): Rapid Strep A Screen: NEGATIVE

## 2015-05-18 MED ORDER — OSELTAMIVIR PHOSPHATE 75 MG PO CAPS: 75.0000 mg | ORAL_CAPSULE | Freq: Two times a day (BID) | ORAL | Status: DC

## 2015-05-18 NOTE — Patient Instructions (Signed)

## 2015-05-18 NOTE — Progress Notes (Signed)
Pre visit review using our clinic review tool, if applicable. No additional management support is needed unless otherwise documented below in the visit note. 

## 2015-05-18 NOTE — Progress Notes (Signed)
Subjective:    Patient ID: Chelsey Sanford, female    DOB: 19-Oct-1975, 40 y.o.   MRN: [id number]  HPI She is here for an acute visit.   A few days ago she did have some mild sinus pressure, but thought that was related to the weather. Yesterday she felt fine.  Today she woke up with terrible throat pain, coughing that is painful, fever at home of 102.7, decreased appetite, nasal congestion, sinus pressure, shortness of breath, wheezing, mild nausea, body aches and headaches. She took alka seltzer cough and cold.    Medications and allergies reviewed with patient and updated if appropriate.  Patient Active Problem List   Diagnosis Date Noted  . Dysautonomia 05/16/2015  . B12 deficiency 05/16/2015  . Superficial phlebitis 04/29/2013  . Contact dermatitis 09/03/2012  . Migraine headache   . GERD (gastroesophageal reflux disease) 10/06/2011  . Vertigo 10/05/2011  . ANXIETY 05/03/2010  . ALLERGIC RHINITIS 05/03/2010  . ANEMIA-NOS 12/26/2007  . POTS (postural orthostatic tachycardia syndrome) 12/26/2007  . Irritable bowel syndrome 09/08/2007  . ENDOMETRIOSIS 07/21/2007    Current Outpatient Prescriptions on File Prior to Visit  Medication Sig Dispense Refill  . citalopram (CELEXA) 20 MG tablet Take 1 tablet (20 mg total) by mouth daily. Must establish with NEW PCP for additional refills. 90 tablet 0  . cyanocobalamin (,VITAMIN B-12,) 1000 MCG/ML injection Inject 1 mL (1,000 mcg total) into the muscle every 30 (thirty) days. 10 mL 0  . cyclobenzaprine (FLEXERIL) 10 MG tablet Take 1 tablet (10 mg total) by mouth 2 (two) times daily as needed for muscle spasms. 20 tablet 0  . docusate sodium (COLACE) 100 MG capsule Take 100 mg by mouth daily.     . drospirenone-ethinyl estradiol (YASMIN,ZARAH,SYEDA) 3-0.03 MG tablet Take 1 tablet by mouth every evening.    . iron polysaccharides (NU-IRON) 150 MG capsule Take 150 mg by mouth daily.      . magnesium oxide (MAGOX 400) 400 (241.3 Mg) MG tablet  Take 1 tablet (400 mg total) by mouth daily. 90 tablet 3  . ondansetron (ZOFRAN) 4 MG tablet Take 1 tablet (4 mg total) by mouth every 8 (eight) hours as needed for nausea or vomiting. 27 tablet 0  . pantoprazole (PROTONIX) 40 MG tablet Take 1 tablet (40 mg total) by mouth daily. Appt needed for additional refills. Must est with new PCP 90 tablet 0  . pindolol (VISKEN) 5 MG tablet TAKE 1 TABLET(5 MG) BY MOUTH DAILY 30 tablet 5  . Probiotic Product (ALIGN) 4 MG CAPS Take 4 mg by mouth daily.     . pseudoephedrine (SUDAFED) 30 MG tablet Take 30 mg by mouth daily as needed for congestion. For nasal congestion    . pyridostigmine (MESTINON) 60 MG tablet TAKE 1 TABLET BY MOUTH TWICE DAILY 60 tablet 5  . rizatriptan (MAXALT) 10 MG tablet TAKE 1 TABLET BY MOUTH AS NEEDED FOR MIGRAINE. MAY REPEAT IN 2 HOURS IF NEEDED. MAXIMUM OF 30 MG IN 24 HOURS. 12 tablet 1  . sodium chloride irrigation (CURITY STERILE SALINE) 0.9 % irrigation Irrigate with 1,000-2,000 mLs as directed once a week. On Sundays - based on hydration levels 500 mL 11  . triamcinolone (NASACORT AQ) 55 MCG/ACT nasal inhaler Place 2 sprays into both nostrils daily as needed (Patient taking 2 sprays in each nare daily as need for seasonal alllergies). For stuffiness    . [DISCONTINUED] magnesium oxide (MAG-OX) 400 MG tablet Take 1 tablet (400 mg total) by  mouth daily. 30 tablet 5   No current facility-administered medications on file prior to visit.    Past Medical History  Diagnosis Date  . ALLERGIC RHINITIS   . ANEMIA-NOS   . ENDOMETRIOSIS   . Lumbar disc disease   . DYSAUTONOMIA   . Headache(784.0)   . Irritable bowel syndrome   . Anxiety   . Delayed gastric emptying     dx at [facility] by gastric emptying study per patient  . GERD (gastroesophageal reflux disease) 10/06/2011  . POTS (postural orthostatic tachycardia syndrome)     Past Surgical History  Procedure Laterality Date  . Ankle reconstruction  1993  . Tonsillectomy  1996  .  Wisdom tooth extraction      Social History   Social History  . Marital Status: Married    Spouse Name: N/A  . Number of Children: 1  . Years of Education: N/A   Occupational History  . Property Management    Social History Main Topics  . Smoking status: Former Smoker    Quit date: 04/30/1996  . Smokeless tobacco: Never Used  . Alcohol Use: Yes     Comment: Occasional wine  . Drug Use: No  . Sexual Activity: Yes    Birth Control/ Protection: Pill   Other Topics Concern  . 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 Thyroid disease Mother     hypothyroidism  . Migraines Mother   . COPD Neg Hx   . Colon cancer Neg Hx   . Colon polyps Mother   . Skin cancer Mother   . Crohn's disease Maternal Grandmother     Review of Systems  Constitutional: Positive for fever and appetite change (decreased).  HENT: Positive for congestion, ear pain (mild), sinus pressure and sore throat (feels like something is stuck in throat).   Respiratory: Positive for apnea, cough (prodctive - thick, discolored - slight green), shortness of breath and wheezing.   Cardiovascular: Positive for chest pain (on inside).  Gastrointestinal: Positive for nausea (this morning, none since). Negative for abdominal pain and diarrhea.  Musculoskeletal: Positive for myalgias.       Soreness from chest to head  Neurological: Positive for headaches.       Objective:   Filed Vitals:   05/18/15 1105  BP: 122/84  Pulse: 97  Temp: 99.4 F (37.4 C)  Resp: 18   Filed Weights   05/18/15 1105  Weight: 180 lb (81.647 kg)   Body mass index is 29.07 kg/(m^2).   Physical Exam GENERAL APPEARANCE: Appears stated age, moderately ill appearing, NAD EYES:  conjunctiva clear, no icterus HEENT: bilateral tympanic membranes and ear canals normal, oropharynx with mild erythema, no thyromegaly, trachea midline, no cervical or supraclavicular lymphadenopathy LUNGS: Clear to auscultation without wheeze or crackles, unlabored breathing, good air entry bilaterally HEART: Normal S1,S2 without murmurs EXTREMITIES: Without clubbing, cyanosis, or edema        Assessment & Plan:   Cold symptoms-influenza A Rapid strep negative Rapid flu positive for influenza A Her rapid flu test and symptoms are consistent with influenza Tamiflu 75 mg twice a day 5 days Discussed over-the-counter cold medications for symptom relief Increase rest and fluids  She will call with any questions or concerns-discussed the increased risk of secondary bacterial infections

## 2015-05-30 ENCOUNTER — Telehealth: Payer: Self-pay | Admitting: Internal Medicine

## 2015-05-30 MED ORDER — PROMETHAZINE-DM 6.25-15 MG/5ML PO SYRP: 5.0000 mL | ORAL_SOLUTION | Freq: Four times a day (QID) | ORAL | Status: DC | PRN

## 2015-05-30 NOTE — Telephone Encounter (Signed)
Pt called in and said that she is still cough and cough so much that she will throw up and she busted a blood vessel in her eye.  She would like to know if Dr burns could call in some meds for her cough?     Pharmacy on [facility] [address]  Best number [phone]

## 2015-05-30 NOTE — Telephone Encounter (Signed)
Sent!

## 2015-05-30 NOTE — Telephone Encounter (Signed)
LVM informing pt

## 2015-05-30 NOTE — Telephone Encounter (Signed)
Please advise 

## 2015-06-05 ENCOUNTER — Other Ambulatory Visit: Payer: Self-pay | Admitting: Internal Medicine

## 2015-07-07 ENCOUNTER — Other Ambulatory Visit: Payer: Self-pay | Admitting: Internal Medicine

## 2015-07-08 NOTE — Telephone Encounter (Signed)
Last filled by Asa Lente 04/18/15. Last OV 05/18/15. Please advise.

## 2015-07-13 ENCOUNTER — Ambulatory Visit
Admission: RE | Admit: 2015-07-13 | Discharge: 2015-07-13 | Disposition: A | Discharge status: Home / Self Care | Payer: BLUE CROSS/BLUE SHIELD | Source: Ambulatory Visit | Attending: Obstetrics and Gynecology | Admitting: Obstetrics and Gynecology

## 2015-07-13 DIAGNOSIS — N6310 Unspecified lump in the right breast, unspecified quadrant: Secondary | ICD-10-CM

## 2015-07-27 ENCOUNTER — Other Ambulatory Visit: Payer: Self-pay | Admitting: Internal Medicine

## 2015-09-06 ENCOUNTER — Encounter: Payer: Self-pay | Admitting: Internal Medicine

## 2015-09-06 ENCOUNTER — Ambulatory Visit (INDEPENDENT_AMBULATORY_CARE_PROVIDER_SITE_OTHER): Payer: BLUE CROSS/BLUE SHIELD | Admitting: Internal Medicine

## 2015-09-06 VITALS — BP 130/85 | HR 67 | Ht 66.0 in | Wt 173.0 lb

## 2015-09-06 DIAGNOSIS — R252 Cramp and spasm: Secondary | ICD-10-CM | POA: Diagnosis not present

## 2015-09-06 DIAGNOSIS — M62838 Other muscle spasm: Secondary | ICD-10-CM | POA: Diagnosis not present

## 2015-09-06 DIAGNOSIS — R000 Tachycardia, unspecified: Secondary | ICD-10-CM | POA: Diagnosis not present

## 2015-09-06 DIAGNOSIS — G90A Postural orthostatic tachycardia syndrome (POTS): Secondary | ICD-10-CM

## 2015-09-06 DIAGNOSIS — I951 Orthostatic hypotension: Secondary | ICD-10-CM

## 2015-09-06 LAB — CBC
HEMATOCRIT: 40.4 % (ref 35.0–45.0)
Hemoglobin: 13.6 g/dL (ref 11.7–15.5)
MCH: 27.5 pg (ref 27.0–33.0)
MCHC: 33.7 g/dL (ref 32.0–36.0)
MCV: 81.6 fL (ref 80.0–100.0)
MPV: 11 fL (ref 7.5–12.5)
PLATELETS: 278 10*3/uL (ref 140–400)
RBC: 4.95 MIL/uL (ref 3.80–5.10)
RDW: 13.6 % (ref 11.0–15.0)
WBC: 10.7 10*3/uL (ref 3.8–10.8)

## 2015-09-06 LAB — BASIC METABOLIC PANEL
BUN: 11 mg/dL (ref 7–25)
CO2: 26 mmol/L (ref 20–31)
Calcium: 9.3 mg/dL (ref 8.6–10.2)
Chloride: 101 mmol/L (ref 98–110)
Creat: 0.76 mg/dL (ref 0.50–1.10)
GLUCOSE: 86 mg/dL (ref 65–99)
Potassium: 4.4 mmol/L (ref 3.5–5.3)
SODIUM: 136 mmol/L (ref 135–146)

## 2015-09-06 LAB — CK: CK TOTAL: 295 U/L — AB (ref 7–177)

## 2015-09-06 LAB — MAGNESIUM: MAGNESIUM: 2 mg/dL (ref 1.5–2.5)

## 2015-09-06 NOTE — Progress Notes (Signed)
Cardiology Office Note   Date:  09/06/2015   ID:  Chelsey Sanford, DOB [birth year], MRN [id number]  PCP:  Binnie Rail, MD  Cardiologist:   Dorris Carnes, MD    Pt presents for eval of dizziness, achiness, cramping     History of Present Illness: Chelsey Sanford is a 40 y.o. female with a history of autonomic dysfunction  THis developed initally in about 2008  I last saw her in clinic in March 2016 THe pt is extremely active  Things are ramping up with summer swimming and with her daughters activies   She says that she has not felt good over the past several wks  More headaches  Fatigued  Dizzy  No syncope  GI function rmains up and down SHe has signif muscle cramping that keeps her awake at night  Back and calves. She stopped getting IV fluids earier in winter     Up to 2 wks ago she was using elliptical daily and stretching  Now she feels she can  She was previously followed at [facility] in GI autonomic area Chryl Heck)       Outpatient Prescriptions Prior to Visit  Medication Sig Dispense Refill  . citalopram (CELEXA) 20 MG tablet TAKE 1 TABLET(20 MG) BY MOUTH DAILY 90 tablet 1  . cyanocobalamin (,VITAMIN B-12,) 1000 MCG/ML injection Inject 1 mL (1,000 mcg total) into the muscle every 30 (thirty) days. 10 mL 0  . cyclobenzaprine (FLEXERIL) 10 MG tablet Take 1 tablet (10 mg total) by mouth 2 (two) times daily as needed for muscle spasms. 20 tablet 0  . docusate sodium (COLACE) 100 MG capsule Take 100 mg by mouth daily.     . drospirenone-ethinyl estradiol (YASMIN,ZARAH,SYEDA) 3-0.03 MG tablet Take 1 tablet by mouth every evening.    . iron polysaccharides (NU-IRON) 150 MG capsule Take 150 mg by mouth daily.      . magnesium oxide (MAGOX 400) 400 (241.3 Mg) MG tablet Take 1 tablet (400 mg total) by mouth daily. 90 tablet 3  . ondansetron (ZOFRAN) 4 MG tablet Take 1 tablet (4 mg total) by mouth every 8 (eight) hours as needed for nausea or vomiting. 27 tablet 0  . pantoprazole (PROTONIX)  40 MG tablet TAKE 1 TABLET BY MOUTH EVERY DAY 90 tablet 1  . pindolol (VISKEN) 5 MG tablet TAKE 1 TABLET(5 MG) BY MOUTH DAILY 30 tablet 5  . Probiotic Product (ALIGN) 4 MG CAPS Take 4 mg by mouth daily.     . pseudoephedrine (SUDAFED) 30 MG tablet Take 30 mg by mouth daily as needed for congestion. For nasal congestion    . pyridostigmine (MESTINON) 60 MG tablet TAKE 1 TABLET BY MOUTH TWICE DAILY 60 tablet 5  . rizatriptan (MAXALT) 10 MG tablet TAKE 1 TABLET BY MOUTH AS NEEDED FOR MIGRAINE. MAY REPEAT IN 2 HOURS IF NEEDED. MAXIMUM OF 30 MG IN 24 HOURS. 12 tablet 1  . triamcinolone (NASACORT AQ) 55 MCG/ACT nasal inhaler Place 2 sprays into both nostrils daily as needed (Patient taking 2 sprays in each nare daily as need for seasonal alllergies). For stuffiness    . oseltamivir (TAMIFLU) 75 MG capsule Take 1 capsule (75 mg total) by mouth 2 (two) times daily. (Patient not taking: Reported on 09/06/2015) 10 capsule 0  . promethazine-dextromethorphan (PROMETHAZINE-DM) 6.25-15 MG/5ML syrup Take 5 mLs by mouth 4 (four) times daily as needed for cough. (Patient not taking: Reported on 09/06/2015) 150 mL 0  . sodium chloride irrigation (CURITY STERILE  SALINE) 0.9 % irrigation Irrigate with 1,000-2,000 mLs as directed once a week. On Sundays - based on hydration levels (Patient not taking: Reported on 09/06/2015) 500 mL 11   No facility-administered medications prior to visit.     Allergies:   Adhesive; Sulfonamide derivatives; Doxycycline; Amoxicillin; Erythromycin; Latex; and Penicillins   Past Medical History  Diagnosis Date  . ALLERGIC RHINITIS   . ANEMIA-NOS   . ENDOMETRIOSIS   . Lumbar disc disease   . DYSAUTONOMIA   . Headache(784.0)   . Irritable bowel syndrome   . Anxiety   . Delayed gastric emptying     dx at [facility] by gastric emptying study per patient  . GERD (gastroesophageal reflux disease) 10/06/2011  . POTS (postural orthostatic tachycardia syndrome)     Past Surgical History  Procedure  Laterality Date  . Ankle reconstruction  1993  . Tonsillectomy  1996  . Wisdom tooth extraction       Social History:  The patient  reports that she quit smoking about 19 years ago. She has never used smokeless tobacco. She reports that she drinks alcohol. She reports that she does not use illicit drugs.   Family History:  The patient's family history includes Colon polyps in her mother; Crohn's disease in her maternal grandmother; Migraines in her mother; Skin cancer in her mother; Thyroid disease in her mother. There is no history of COPD or Colon cancer.    ROS:  Please see the history of present illness. All other systems are reviewed and  Negative to the above problem except as noted.    PHYSICAL EXAM: VS:  BP 130/85 mmHg  Pulse 67  Ht 5\' 6"  (1.676 m)  Wt 173 lb (78.472 kg)  BMI 27.94 kg/m2  GEN: Well nourished, well developed, in no acute distress HEENT: normal Neck: no JVD, carotid bruits, or masses Cardiac: RRR; no murmurs, rubs, or gallops,no edema  Respiratory:  clear to auscultation bilaterally, normal work of breathing GI: soft, nontender, nondistended, + BS  No hepatomegaly  MS: no deformity Moving all extremities   Skin: warm and dry, no rash Neuro:  Strength and sensation are intact Psych: euthymic mood, full affect   EKG:  EKG is ordered today.  SR 68 bpm    Lipid Panel No results found for: CHOL, TRIG, HDL, CHOLHDL, VLDL, LDLCALC, LDLDIRECT    Wt Readings from Last 3 Encounters:  09/06/15 173 lb (78.472 kg)  05/18/15 180 lb (81.647 kg)  05/16/15 178 lb (80.74 kg)      ASSESSMENT AND PLAN:  1  Hx of dysautonomia  Pt is not orthstatic on exam today  BP is acutally high  I would increase pindolol to 5 in AM   2.5 PM COntinue to stay hydrated   Will check labs   Will try to contact [facility] re meds, possible f/u in future   2.  Cramping/[place]  Check CK and electrolytes         Signed, Dorris Carnes, MD  09/06/2015 11:39 AM    [facility] [address], [place], [place]  [address] Phone: [phone]; Fax: [phone]

## 2015-09-06 NOTE — Patient Instructions (Signed)
Your physician recommends that you continue on your current medications as directed. Please refer to the Current Medication list given to you today.   Your physician recommends that you return for lab work in: today (BMET, CBC, MAGNESIUM, CK AND URINE SPECIMEN)

## 2015-09-07 ENCOUNTER — Other Ambulatory Visit: Payer: Self-pay

## 2015-09-07 ENCOUNTER — Other Ambulatory Visit (INDEPENDENT_AMBULATORY_CARE_PROVIDER_SITE_OTHER): Payer: BLUE CROSS/BLUE SHIELD | Admitting: *Deleted

## 2015-09-07 DIAGNOSIS — R748 Abnormal levels of other serum enzymes: Secondary | ICD-10-CM

## 2015-09-07 LAB — URINALYSIS
BILIRUBIN URINE: NEGATIVE
GLUCOSE, UA: NEGATIVE
HGB URINE DIPSTICK: NEGATIVE
Ketones, ur: NEGATIVE
Nitrite: NEGATIVE
Protein, ur: NEGATIVE
Specific Gravity, Urine: 1.017 (ref 1.001–1.035)
pH: 7 (ref 5.0–8.0)

## 2015-09-07 LAB — TSH: TSH: 2.03 m[IU]/L

## 2015-09-07 LAB — RHEUMATOID FACTOR: Rhuematoid fact SerPl-aCnc: <10 IU/mL (ref <=14)

## 2015-09-08 LAB — SEDIMENTATION RATE: Sed Rate: 4 mm/hr (ref 0–20)

## 2015-09-08 LAB — ANA: ANA: NEGATIVE

## 2015-10-03 ENCOUNTER — Other Ambulatory Visit: Payer: Self-pay | Admitting: *Deleted

## 2015-10-03 ENCOUNTER — Other Ambulatory Visit (INDEPENDENT_AMBULATORY_CARE_PROVIDER_SITE_OTHER): Payer: BLUE CROSS/BLUE SHIELD | Admitting: *Deleted

## 2015-10-03 DIAGNOSIS — R000 Tachycardia, unspecified: Principal | ICD-10-CM

## 2015-10-03 DIAGNOSIS — I951 Orthostatic hypotension: Principal | ICD-10-CM

## 2015-10-03 DIAGNOSIS — G90A Postural orthostatic tachycardia syndrome (POTS): Secondary | ICD-10-CM

## 2015-10-03 LAB — COMPREHENSIVE METABOLIC PANEL
ALBUMIN: 4 g/dL (ref 3.6–5.1)
ALT: 15 U/L (ref 6–29)
AST: 17 U/L (ref 10–30)
Alkaline Phosphatase: 41 U/L (ref 33–115)
BILIRUBIN TOTAL: 0.4 mg/dL (ref 0.2–1.2)
BUN: 13 mg/dL (ref 7–25)
CHLORIDE: 101 mmol/L (ref 98–110)
CO2: 25 mmol/L (ref 20–31)
CREATININE: 0.72 mg/dL (ref 0.50–1.10)
Calcium: 9.2 mg/dL (ref 8.6–10.2)
Glucose, Bld: 92 mg/dL (ref 65–99)
Potassium: 4.1 mmol/L (ref 3.5–5.3)
SODIUM: 136 mmol/L (ref 135–146)
TOTAL PROTEIN: 6.7 g/dL (ref 6.1–8.1)

## 2015-10-03 LAB — SEDIMENTATION RATE: Sed Rate: 1 mm/hr (ref 0–20)

## 2015-10-03 LAB — CK: CK TOTAL: 136 U/L (ref 7–177)

## 2015-10-06 LAB — MYOGLOBIN, SERUM: MYOGLOBIN: 32 ug/L (ref <=66)

## 2015-11-09 ENCOUNTER — Telehealth: Payer: BLUE CROSS/BLUE SHIELD | Admitting: Family

## 2015-11-09 DIAGNOSIS — J019 Acute sinusitis, unspecified: Secondary | ICD-10-CM

## 2015-11-09 MED ORDER — LEVOFLOXACIN 500 MG PO TABS: 500.0000 mg | ORAL_TABLET | Freq: Every day | ORAL | Status: DC

## 2015-11-09 NOTE — Progress Notes (Signed)

## 2015-11-20 ENCOUNTER — Other Ambulatory Visit: Payer: Self-pay | Admitting: Internal Medicine

## 2015-11-23 NOTE — Telephone Encounter (Signed)
Pharmacy should request rizatriptan from PCP.

## 2015-12-08 ENCOUNTER — Other Ambulatory Visit: Payer: Self-pay | Admitting: Internal Medicine

## 2015-12-08 DIAGNOSIS — Z1231 Encounter for screening mammogram for malignant neoplasm of breast: Secondary | ICD-10-CM

## 2015-12-22 ENCOUNTER — Other Ambulatory Visit: Payer: Self-pay | Admitting: *Deleted

## 2015-12-22 MED ORDER — RIZATRIPTAN BENZOATE 10 MG PO TABS: ORAL_TABLET | ORAL | 1 refills | Status: DC

## 2016-01-03 ENCOUNTER — Ambulatory Visit: Payer: BLUE CROSS/BLUE SHIELD

## 2016-01-14 ENCOUNTER — Other Ambulatory Visit: Payer: Self-pay | Admitting: Internal Medicine

## 2016-01-14 ENCOUNTER — Ambulatory Visit (HOSPITAL_COMMUNITY)
Admission: EM | Admit: 2016-01-14 | Discharge: 2016-01-14 | Disposition: A | Discharge status: Home / Self Care | Payer: BLUE CROSS/BLUE SHIELD | Attending: Family Medicine | Admitting: Family Medicine

## 2016-01-14 ENCOUNTER — Encounter (HOSPITAL_COMMUNITY): Payer: Self-pay | Admitting: Emergency Medicine

## 2016-01-14 DIAGNOSIS — G43909 Migraine, unspecified, not intractable, without status migrainosus: Secondary | ICD-10-CM

## 2016-01-14 MED ORDER — DIPHENHYDRAMINE HCL 50 MG/ML IJ SOLN
INTRAMUSCULAR | Status: AC
  Filled 2016-01-14: qty 1

## 2016-01-14 MED ORDER — ONDANSETRON HCL 4 MG/2ML IJ SOLN
4.0000 mg | Freq: Once | INTRAMUSCULAR | Status: AC
  Administered 2016-01-14: 4 mg via INTRAMUSCULAR

## 2016-01-14 MED ORDER — DEXAMETHASONE SODIUM PHOSPHATE 10 MG/ML IJ SOLN
INTRAMUSCULAR | Status: AC
  Filled 2016-01-14: qty 1

## 2016-01-14 MED ORDER — DEXAMETHASONE SODIUM PHOSPHATE 10 MG/ML IJ SOLN
10.0000 mg | Freq: Once | INTRAMUSCULAR | Status: AC
  Administered 2016-01-14: 10 mg via INTRAMUSCULAR

## 2016-01-14 MED ORDER — KETOROLAC TROMETHAMINE 60 MG/2ML IM SOLN
INTRAMUSCULAR | Status: AC
  Filled 2016-01-14: qty 2

## 2016-01-14 MED ORDER — ONDANSETRON HCL 4 MG/2ML IJ SOLN
INTRAMUSCULAR | Status: AC
  Filled 2016-01-14: qty 2

## 2016-01-14 MED ORDER — DIPHENHYDRAMINE HCL 50 MG/ML IJ SOLN
25.0000 mg | Freq: Once | INTRAMUSCULAR | Status: AC
  Administered 2016-01-14: 25 mg via INTRAMUSCULAR

## 2016-01-14 MED ORDER — KETOROLAC TROMETHAMINE 60 MG/2ML IM SOLN
60.0000 mg | Freq: Once | INTRAMUSCULAR | Status: AC
  Administered 2016-01-14: 60 mg via INTRAMUSCULAR

## 2016-01-14 NOTE — ED Triage Notes (Signed)
Pt c/o HA onset 2-3 days  See physicians note.

## 2016-01-14 NOTE — ED Provider Notes (Signed)
CSN: [id number]     Arrival date & time 01/14/16  1704 History   First MD Initiated Contact with Patient 01/14/16 1846     Chief Complaint  Patient presents with  . Headache   (Consider location/radiation/quality/duration/timing/severity/associated sxs/prior Treatment) 40 y.o. female presents with persistent and worsening migraine X 2 . Condition is acute on chronic in nature. Condition is made better by nothing . Condition is made worse by light. Patient denies any relief from  Prescription migraine medication  prior to there arrival at this facility. Patient states that she has not had a migraine as bad as this in a "long time" patient reports bilateral head pain radiating from behind her eyes down her next with nausea and vomiting. Patient states that she had vision problems yesterday but they self resolved.        Past Medical History:  Diagnosis Date  . ALLERGIC RHINITIS   . ANEMIA-NOS   . Anxiety   . Delayed gastric emptying    dx at [facility] by gastric emptying study per patient  . DYSAUTONOMIA   . ENDOMETRIOSIS   . GERD (gastroesophageal reflux disease) 10/06/2011  . Headache(784.0)   . Irritable bowel syndrome   . Lumbar disc disease   . POTS (postural orthostatic tachycardia syndrome)    Past Surgical History:  Procedure Laterality Date  . ANKLE RECONSTRUCTION  1993  . TONSILLECTOMY  1996  . WISDOM TOOTH EXTRACTION     Family History  Problem Relation Age of Onset  . Thyroid disease Mother     hypothyroidism  . Migraines Mother   . Colon polyps Mother   . Skin cancer Mother   . Crohn's disease Maternal Grandmother   . COPD Neg Hx   . Colon cancer Neg Hx    Social History  Substance Use Topics  . Smoking status: Former Smoker    Quit date: 04/30/1996  . Smokeless tobacco: Never Used  . Alcohol use Yes     Comment: Occasional wine   OB History    No data available     Review of Systems  Allergies  Adhesive [tape]; Sulfonamide derivatives; Doxycycline;  Amoxicillin; Erythromycin; Latex; and Penicillins  Home Medications   Prior to Admission medications   Medication Sig Start Date End Date Taking? Authorizing Provider  citalopram (CELEXA) 20 MG tablet TAKE 1 TABLET(20 MG) BY MOUTH DAILY 07/27/15  Yes Binnie Rail, MD  docusate sodium (COLACE) 100 MG capsule Take 100 mg by mouth daily.    Yes Historical Provider, MD  drospirenone-ethinyl estradiol (YASMIN,ZARAH,SYEDA) 3-0.03 MG tablet Take 1 tablet by mouth every evening.   Yes Historical Provider, MD  magnesium oxide (MAGOX 400) 400 (241.3 Mg) MG tablet Take 1 tablet (400 mg total) by mouth daily. 05/16/15  Yes Binnie Rail, MD  pantoprazole (PROTONIX) 40 MG tablet TAKE 1 TABLET BY MOUTH EVERY DAY 06/06/15  Yes Binnie Rail, MD  pindolol (VISKEN) 5 MG tablet Take 1 tablet (5 mg total) by mouth daily. 11/21/15  Yes Fay Records, MD  Probiotic Product (ALIGN) 4 MG CAPS Take 4 mg by mouth daily.    Yes Historical Provider, MD  pseudoephedrine (SUDAFED) 30 MG tablet Take 30 mg by mouth daily as needed for congestion. For nasal congestion   Yes Historical Provider, MD  pyridostigmine (MESTINON) 60 MG tablet TAKE 1 TABLET BY MOUTH TWICE DAILY 11/12/14  Yes Fay Records, MD  rizatriptan (MAXALT) 10 MG tablet TAKE 1 TABLET BY MOUTH  AT BEDTIME  AS NEEDED FOR MIGRAINE. MAY REPEAT IN 2 HOURS IF NEEDED. MAXIMUM OF 30 MG (3 TABLETS) IN 24 HOURS. 12/22/15  Yes Fay Records, MD  triamcinolone (NASACORT AQ) 55 MCG/ACT nasal inhaler Place 2 sprays into both nostrils daily as needed (Patient taking 2 sprays in each nare daily as need for seasonal alllergies). For stuffiness   Yes Historical Provider, MD  cyanocobalamin (,VITAMIN B-12,) 1000 MCG/ML injection Inject 1 mL (1,000 mcg total) into the muscle every 30 (thirty) days. 05/28/14   Rowe Clack, MD  cyclobenzaprine (FLEXERIL) 10 MG tablet Take 1 tablet (10 mg total) by mouth 2 (two) times daily as needed for muscle spasms. 06/09/12   Varney Biles, MD  iron  polysaccharides (NU-IRON) 150 MG capsule Take 150 mg by mouth daily.      Historical Provider, MD  levofloxacin (LEVAQUIN) 500 MG tablet Take 1 tablet (500 mg total) by mouth daily. 11/09/15   Sharion Balloon, FNP  ondansetron (ZOFRAN) 4 MG tablet Take 1 tablet (4 mg total) by mouth every 8 (eight) hours as needed for nausea or vomiting. 09/29/14   Dorothyann Peng, NP   Meds Ordered and Administered this Visit   Medications  dexamethasone (DECADRON) injection 10 mg (10 mg Intramuscular Given 01/14/16 1929)  diphenhydrAMINE (BENADRYL) injection 25 mg (25 mg Intramuscular Given 01/14/16 1929)  ketorolac (TORADOL) injection 60 mg (60 mg Intramuscular Given 01/14/16 1929)  ondansetron (ZOFRAN) injection 4 mg (4 mg Intramuscular Given 01/14/16 1929)    BP 140/95 (BP Location: Right Arm)   Pulse 78   Temp 98.2 F (36.8 C) (Oral)   Resp 12   SpO2 99%  No data found.   Physical Exam  Urgent Care Course   Clinical Course    Procedures (including critical care time)  Labs Review Labs Reviewed - No data to display  Imaging Review No results found.   Visual Acuity Review  Right Eye Distance:   Left Eye Distance:   Bilateral Distance:    Right Eye Near:   Left Eye Near:    Bilateral Near:      Patient reports improved with IM medication.   MDM   1. Migraine without status migrainosus, not intractable, unspecified migraine type        Jacqualine Mau, NP 01/14/16 1952

## 2016-01-14 NOTE — ED Notes (Signed)
Chelsey Ivory, NP states pt walked out and felt better and did not want to wait for d/c instructions.

## 2016-01-14 NOTE — Discharge Instructions (Signed)
Follow up in ED is symptoms worsen.

## 2016-01-16 NOTE — Telephone Encounter (Signed)
Okay to refill? Please advise. Thanks, MI 

## 2016-01-18 ENCOUNTER — Other Ambulatory Visit: Payer: Self-pay | Admitting: Internal Medicine

## 2016-01-18 ENCOUNTER — Telehealth: Payer: Self-pay | Admitting: *Deleted

## 2016-01-18 NOTE — Telephone Encounter (Signed)
OK to refill. Pt should have f/u with GI at [facility]  Mestinon was initally Rx'd there   Should also have f/u this winter in clinic

## 2016-01-19 ENCOUNTER — Encounter: Payer: Self-pay | Admitting: *Deleted

## 2016-01-19 MED ORDER — PYRIDOSTIGMINE BROMIDE 60 MG PO TABS: 60.0000 mg | ORAL_TABLET | Freq: Two times a day (BID) | ORAL | 5 refills | Status: DC

## 2016-01-19 NOTE — Telephone Encounter (Signed)
Patient takes pyridostigmine for POTS.  Please refill.

## 2016-01-19 NOTE — Telephone Encounter (Signed)
Sent MyChart message to patient advising follow up with GI at [facility] and to call office to schedule f/u for Dr. Harrington Challenger for this winter.

## 2016-01-24 ENCOUNTER — Ambulatory Visit
Admission: RE | Admit: 2016-01-24 | Discharge: 2016-01-24 | Disposition: A | Discharge status: Home / Self Care | Payer: BLUE CROSS/BLUE SHIELD | Source: Ambulatory Visit | Attending: Internal Medicine | Admitting: Internal Medicine

## 2016-01-24 DIAGNOSIS — Z1231 Encounter for screening mammogram for malignant neoplasm of breast: Secondary | ICD-10-CM

## 2016-01-26 NOTE — Telephone Encounter (Signed)
Called patient and asked her to call back so that I could give her the information.  Also advised that she could check her My Chart account for the message I sent her last week if that is more convenient.

## 2016-01-27 ENCOUNTER — Telehealth: Payer: Self-pay | Admitting: Emergency Medicine

## 2016-01-27 NOTE — Telephone Encounter (Signed)
Spoke with pt, appt scheduled for follow-up with Dr Quay Burow.

## 2016-01-27 NOTE — Telephone Encounter (Signed)
Pt called and stated she wanted to know if she can get her mammogram results. She also said afterwards her breast were very sore and she feels a lump in her right breast. She wants to know if she needs to follow up on this. Please advise thanks.

## 2016-02-01 ENCOUNTER — Ambulatory Visit: Payer: BLUE CROSS/BLUE SHIELD | Admitting: Internal Medicine

## 2016-03-18 ENCOUNTER — Emergency Department (HOSPITAL_BASED_OUTPATIENT_CLINIC_OR_DEPARTMENT_OTHER)
Admission: EM | Admit: 2016-03-18 | Discharge: 2016-03-18 | Disposition: A | Discharge status: Home / Self Care | Payer: BLUE CROSS/BLUE SHIELD | Attending: Emergency Medicine | Admitting: Emergency Medicine

## 2016-03-18 ENCOUNTER — Encounter (HOSPITAL_BASED_OUTPATIENT_CLINIC_OR_DEPARTMENT_OTHER): Payer: Self-pay | Admitting: *Deleted

## 2016-03-18 DIAGNOSIS — Z87891 Personal history of nicotine dependence: Secondary | ICD-10-CM | POA: Diagnosis not present

## 2016-03-18 DIAGNOSIS — G43011 Migraine without aura, intractable, with status migrainosus: Secondary | ICD-10-CM | POA: Diagnosis not present

## 2016-03-18 DIAGNOSIS — Z79899 Other long term (current) drug therapy: Secondary | ICD-10-CM | POA: Diagnosis not present

## 2016-03-18 DIAGNOSIS — R51 Headache: Secondary | ICD-10-CM | POA: Diagnosis present

## 2016-03-18 HISTORY — DX: Migraine, unspecified, not intractable, without status migrainosus: G43.909

## 2016-03-18 MED ORDER — METOCLOPRAMIDE HCL 5 MG/ML IJ SOLN
10.0000 mg | Freq: Once | INTRAMUSCULAR | Status: AC
  Administered 2016-03-18: 10 mg via INTRAVENOUS
  Filled 2016-03-18: qty 2

## 2016-03-18 MED ORDER — SODIUM CHLORIDE 0.9 % IV BOLUS (SEPSIS)
1000.0000 mL | Freq: Once | INTRAVENOUS | Status: AC
  Administered 2016-03-18: 1000 mL via INTRAVENOUS

## 2016-03-18 MED ORDER — DEXAMETHASONE SODIUM PHOSPHATE 10 MG/ML IJ SOLN
10.0000 mg | Freq: Once | INTRAMUSCULAR | Status: AC
  Administered 2016-03-18: 10 mg via INTRAVENOUS
  Filled 2016-03-18: qty 1

## 2016-03-18 MED ORDER — DIPHENHYDRAMINE HCL 50 MG/ML IJ SOLN
25.0000 mg | Freq: Once | INTRAMUSCULAR | Status: AC
  Administered 2016-03-18: 25 mg via INTRAVENOUS
  Filled 2016-03-18: qty 1

## 2016-03-18 NOTE — ED Triage Notes (Signed)
Pt reports hx of migraine and states she began developing one this past Friday. Reports associated sensitivity to light and sound and nausea; denies fever, v/d. Reports taking usual migraine meds with no relief. Denies dizziness, vision changes.

## 2016-03-18 NOTE — ED Notes (Signed)
Migraine x3 days, has taken prescriptions for same. Has not gotten better.

## 2016-03-18 NOTE — ED Provider Notes (Addendum)
[place] DEPT [facility] Provider Note   CSN: [id number] Arrival date & time: 03/18/16  1022     History   Chief Complaint Chief Complaint  Patient presents with  . Migraine    HPI Chelsey Sanford is a 40 y.o. female.  The history is provided by the patient.  Migraine  This is a chronic problem. The current episode started 2 days ago. The problem occurs constantly. The problem has been gradually worsening. Associated symptoms include headaches. Associated symptoms comments: Nausea.  No vomiting, fever or neck pain. She does have history of allergies but has used all of her allergy medication does not have any allergy symptoms currently. He is tried all of her home migraine medications without improvement.. Exacerbated by: Bright lights and loud noise. Nothing relieves the symptoms. Treatments tried: her home migraine meds. The treatment provided no relief.    Past Medical History:  Diagnosis Date  . ALLERGIC RHINITIS   . ANEMIA-NOS   . Anxiety   . Delayed gastric emptying    dx at [facility] by gastric emptying study per patient  . DYSAUTONOMIA   . ENDOMETRIOSIS   . GERD (gastroesophageal reflux disease) 10/06/2011  . Headache(784.0)   . Irritable bowel syndrome   . Lumbar disc disease   . Migraine   . POTS (postural orthostatic tachycardia syndrome)     Patient Active Problem List   Diagnosis Date Noted  . Dysautonomia 05/16/2015  . B12 deficiency 05/16/2015  . Superficial phlebitis 04/29/2013  . Contact dermatitis 09/03/2012  . Migraine headache   . GERD (gastroesophageal reflux disease) 10/06/2011  . Vertigo 10/05/2011  . ANXIETY 05/03/2010  . ALLERGIC RHINITIS 05/03/2010  . ANEMIA-NOS 12/26/2007  . POTS (postural orthostatic tachycardia syndrome) 12/26/2007  . Irritable bowel syndrome 09/08/2007  . ENDOMETRIOSIS 07/21/2007    Past Surgical History:  Procedure Laterality Date  . ANKLE RECONSTRUCTION  1993  . ELBOW SURGERY Right 2015  . TONSILLECTOMY  1996  .  WISDOM TOOTH EXTRACTION      OB History    No data available       Home Medications    Prior to Admission medications   Medication Sig Start Date End Date Taking? Authorizing Provider  citalopram (CELEXA) 20 MG tablet TAKE 1 TABLET(20 MG) BY MOUTH DAILY 07/27/15  Yes Binnie Rail, MD  cyclobenzaprine (FLEXERIL) 10 MG tablet Take 1 tablet (10 mg total) by mouth 2 (two) times daily as needed for muscle spasms. 06/09/12  Yes Varney Biles, MD  docusate sodium (COLACE) 100 MG capsule Take 100 mg by mouth daily.    Yes Historical Provider, MD  drospirenone-ethinyl estradiol (YASMIN,ZARAH,SYEDA) 3-0.03 MG tablet Take 1 tablet by mouth every evening.   Yes Historical Provider, MD  iron polysaccharides (NU-IRON) 150 MG capsule Take 150 mg by mouth daily.     Yes Historical Provider, MD  magnesium oxide (MAGOX 400) 400 (241.3 Mg) MG tablet Take 1 tablet (400 mg total) by mouth daily. 05/16/15  Yes Binnie Rail, MD  pantoprazole (PROTONIX) 40 MG tablet TAKE 1 TABLET BY MOUTH EVERY DAY 01/16/16  Yes Binnie Rail, MD  pindolol (VISKEN) 5 MG tablet Take 1 tablet (5 mg total) by mouth daily. 11/21/15  Yes Fay Records, MD  Probiotic Product (ALIGN) 4 MG CAPS Take 4 mg by mouth daily.    Yes Historical Provider, MD  pseudoephedrine (SUDAFED) 30 MG tablet Take 30 mg by mouth daily as needed for congestion. For nasal congestion   Yes Historical  Provider, MD  pyridostigmine (MESTINON) 60 MG tablet Take 1 tablet (60 mg total) by mouth 2 (two) times daily. 01/19/16  Yes Fay Records, MD  rizatriptan (MAXALT) 10 MG tablet TAKE 1 TABLET BY MOUTH  AT BEDTIME AS NEEDED FOR MIGRAINE. MAY REPEAT IN 2 HOURS IF NEEDED. MAXIMUM OF 30 MG (3 TABLETS) IN 24 HOURS. 12/22/15  Yes Fay Records, MD  triamcinolone (NASACORT AQ) 55 MCG/ACT nasal inhaler Place 2 sprays into both nostrils daily as needed (Patient taking 2 sprays in each nare daily as need for seasonal alllergies). For stuffiness   Yes Historical Provider, MD    cyanocobalamin (,VITAMIN B-12,) 1000 MCG/ML injection Inject 1 mL (1,000 mcg total) into the muscle every 30 (thirty) days. 05/28/14   Rowe Clack, MD  levofloxacin (LEVAQUIN) 500 MG tablet Take 1 tablet (500 mg total) by mouth daily. 11/09/15   Sharion Balloon, FNP  ondansetron (ZOFRAN) 4 MG tablet Take 1 tablet (4 mg total) by mouth every 8 (eight) hours as needed for nausea or vomiting. 09/29/14   Dorothyann Peng, NP    Family History Family History  Problem Relation Age of Onset  . Thyroid disease Mother     hypothyroidism  . Migraines Mother   . Colon polyps Mother   . Skin cancer Mother   . Crohn's disease Maternal Grandmother   . COPD Neg Hx   . Colon cancer Neg Hx     Social History Social History  Substance Use Topics  . Smoking status: Former Smoker    Quit date: 04/30/1996  . Smokeless tobacco: Never Used  . Alcohol use Yes     Comment: weekly     Allergies   Adhesive [tape]; Sulfonamide derivatives; Doxycycline; Amoxicillin; Erythromycin; Latex; and Penicillins   Review of Systems Review of Systems  Neurological: Positive for headaches.  All other systems reviewed and are negative.    Physical Exam Updated Vital Signs BP (!) 143/105 (BP Location: Left Arm)   Pulse 80   Temp 98.4 F (36.9 C) (Oral)   Resp 18   Ht 5\' 6"  (1.676 m)   Wt 165 lb (74.8 kg)   LMP 03/14/2016   SpO2 100%   BMI 26.63 kg/m   Physical Exam  Constitutional: She is oriented to person, place, and time. She appears well-developed and well-nourished. She appears distressed.  HENT:  Head: Normocephalic and atraumatic.  Mouth/Throat: Oropharynx is clear and moist.  Eyes: Conjunctivae and EOM are normal. Pupils are equal, round, and reactive to light. Right eye exhibits no discharge. Left eye exhibits no discharge.  photophobia  Neck: Normal range of motion. Neck supple. No spinous process tenderness present. No neck rigidity. No Brudzinski's sign and no Kernig's sign noted.   Cardiovascular: Normal rate, normal heart sounds and intact distal pulses.   No murmur heard. Pulmonary/Chest: Effort normal and breath sounds normal. No respiratory distress. She has no wheezes. She has no rales.  Abdominal: Soft. She exhibits no distension. There is no tenderness.  Musculoskeletal: Normal range of motion. She exhibits no edema or tenderness.  Lymphadenopathy:    She has no cervical adenopathy.  Neurological: She is alert and oriented to person, place, and time. She has normal strength. No cranial nerve deficit or sensory deficit. Coordination and gait normal. GCS eye subscore is 4. GCS verbal subscore is 5. GCS motor subscore is 6.  Skin: Skin is warm and dry.  Psychiatric: She has a normal mood and affect. Her behavior is normal.  Nursing note and vitals reviewed.    ED Treatments / Results  Labs (all labs ordered are listed, but only abnormal results are displayed) Labs Reviewed - No data to display  EKG  EKG Interpretation None       Radiology No results found.  Procedures Procedures (including critical care time)  Medications Ordered in ED Medications  sodium chloride 0.9 % bolus 1,000 mL (1,000 mLs Intravenous New Bag/Given 03/18/16 1217)  metoCLOPramide (REGLAN) injection 10 mg (10 mg Intravenous Given 03/18/16 1221)  diphenhydrAMINE (BENADRYL) injection 25 mg (25 mg Intravenous Given 03/18/16 1218)  dexamethasone (DECADRON) injection 10 mg (10 mg Intravenous Given 03/18/16 1219)     Initial Impression / Assessment and Plan / ED Course  I have reviewed the triage vital signs and the nursing notes.  Pertinent labs & imaging results that were available during my care of the patient were reviewed by me and considered in my medical decision making (see chart for details).  Clinical Course    Pt with typical migraine HA without sx suggestive of SAH(sudden onset, worst of life, or deficits), infection, or cavernous vein thrombosis.  Normal neuro  exam and vital signs. Will give HA cocktail and will re-eval.  1:16 PM Pt feeling much better and d/ced home.  Final Clinical Impressions(s) / ED Diagnoses   Final diagnoses:  Intractable migraine without aura and with status migrainosus    New Prescriptions New Prescriptions   No medications on file     Blanchie Dessert, MD 03/18/16 1317    Blanchie Dessert, MD 03/18/16 1317

## 2016-03-18 NOTE — ED Notes (Signed)
ED Provider at bedside. 

## 2016-04-11 ENCOUNTER — Encounter: Payer: Self-pay | Admitting: Internal Medicine

## 2016-04-24 ENCOUNTER — Encounter: Payer: Self-pay | Admitting: *Deleted

## 2016-05-11 ENCOUNTER — Encounter: Payer: Self-pay | Admitting: Internal Medicine

## 2016-05-11 ENCOUNTER — Ambulatory Visit (INDEPENDENT_AMBULATORY_CARE_PROVIDER_SITE_OTHER): Payer: BLUE CROSS/BLUE SHIELD | Admitting: Internal Medicine

## 2016-05-11 VITALS — BP 136/89 | HR 82 | Ht 66.0 in | Wt 177.4 lb

## 2016-05-11 DIAGNOSIS — R52 Pain, unspecified: Secondary | ICD-10-CM

## 2016-05-11 DIAGNOSIS — G909 Disorder of the autonomic nervous system, unspecified: Secondary | ICD-10-CM | POA: Diagnosis not present

## 2016-05-11 DIAGNOSIS — G43809 Other migraine, not intractable, without status migrainosus: Secondary | ICD-10-CM

## 2016-05-11 DIAGNOSIS — G901 Familial dysautonomia [Riley-Day]: Secondary | ICD-10-CM

## 2016-05-11 NOTE — Patient Instructions (Signed)
Your physician recommends that you continue on your current medications as directed. Please refer to the Current Medication list given to you today.  Your physician recommends that you return for lab work today (BMET, CBC, CK, SED RATE)  You have been referred to [place]

## 2016-05-11 NOTE — Progress Notes (Signed)
Cardiology Office Note   Date:  05/11/2016   ID:  Chelsey Sanford, DOB 10/01/1975, MRN [id number]  PCP:  Binnie Rail, MD  Cardiologist:   Dorris Carnes, MD   F/U of autonomic dysfunciton      History of Present Illness: Chelsey Sanford is a 41 y.o. female with a history of autonomic dysfunction  I saw herin May 2017   She called at end of year  Complaining of inceased HA She was seen in ED in November for HA  Rx with pain meds   The pt has episodes of dizziness but continues to stay active  Denies syncope  Menstrual cycles appear to make things worse    She has had signif headaches/migraines over the past several months  She also complains of achiness   Diffuse  She had an elevated CK in past   Gets a lot of cramping    Current Meds  Medication Sig  . citalopram (CELEXA) 20 MG tablet TAKE 1 TABLET(20 MG) BY MOUTH DAILY  . cyclobenzaprine (FLEXERIL) 10 MG tablet Take 1 tablet (10 mg total) by mouth 2 (two) times daily as needed for muscle spasms.  Marland Kitchen docusate sodium (COLACE) 100 MG capsule Take 100 mg by mouth daily.   . drospirenone-ethinyl estradiol (YASMIN,ZARAH,SYEDA) 3-0.03 MG tablet Take 1 tablet by mouth every evening.  . iron polysaccharides (NU-IRON) 150 MG capsule Take 150 mg by mouth daily.    . Lactobacillus-Inulin ([place] PO) Take by mouth as directed.  . magnesium oxide (MAGOX 400) 400 (241.3 Mg) MG tablet Take 1 tablet (400 mg total) by mouth daily.  . ondansetron (ZOFRAN) 4 MG tablet Take 1 tablet (4 mg total) by mouth every 8 (eight) hours as needed for nausea or vomiting.  . pantoprazole (PROTONIX) 40 MG tablet TAKE 1 TABLET BY MOUTH EVERY DAY  . pindolol (VISKEN) 5 MG tablet Take 1 tablet (5 mg total) by mouth daily.  . pseudoephedrine (SUDAFED) 30 MG tablet Take 30 mg by mouth daily as needed for congestion. For nasal congestion  . pyridostigmine (MESTINON) 60 MG tablet Take 1 tablet (60 mg total) by mouth 2 (two) times daily.  . rizatriptan  (MAXALT) 10 MG tablet TAKE 1 TABLET BY MOUTH  AT BEDTIME AS NEEDED FOR MIGRAINE. MAY REPEAT IN 2 HOURS IF NEEDED. MAXIMUM OF 30 MG (3 TABLETS) IN 24 HOURS.  Marland Kitchen triamcinolone (NASACORT AQ) 55 MCG/ACT nasal inhaler Place 2 sprays into both nostrils daily as needed (Patient taking 2 sprays in each nare daily as need for seasonal alllergies). For stuffiness     Allergies:   Adhesive [tape]; Sulfonamide derivatives; Doxycycline; Amoxicillin; Erythromycin; Latex; and Penicillins   Past Medical History:  Diagnosis Date  . ALLERGIC RHINITIS   . ANEMIA-NOS   . Anxiety   . Delayed gastric emptying    dx at [facility] by gastric emptying study per patient  . DYSAUTONOMIA   . ENDOMETRIOSIS   . GERD (gastroesophageal reflux disease) 10/06/2011  . Headache(784.0)   . Irritable bowel syndrome   . Lumbar disc disease   . Migraine   . POTS (postural orthostatic tachycardia syndrome)     Past Surgical History:  Procedure Laterality Date  . ANKLE RECONSTRUCTION  1993  . ELBOW SURGERY Right 2015  . TONSILLECTOMY  1996  . WISDOM TOOTH EXTRACTION       Social History:  The patient  reports that she quit smoking about 20 years ago. She has never used smokeless tobacco. She  reports that she drinks alcohol. She reports that she does not use drugs.   Family History:  The patient's family history includes Colon polyps in her mother; Crohn's disease in her maternal grandmother; Migraines in her mother; Skin cancer in her mother; Thyroid disease in her mother.    ROS:  Please see the history of present illness. All other systems are reviewed and  Negative to the above problem except as noted.    PHYSICAL EXAM: VS:  BP 136/89   Pulse 82   Ht 5\' 6"  (1.676 m)   Wt 177 lb 6.4 oz (80.5 kg)   BMI 28.63 kg/m   GEN: Well nourished, well developed, in no acute distress  HEENT: normal  Neck: no JVD, carotid bruits, or masses Cardiac: RRR; no murmurs, rubs, or gallops,no edema  Respiratory:  clear to auscultation  bilaterally, normal work of breathing GI: soft, nontender, nondistended, + BS  No hepatomegaly  MS: no deformity Moving all extremities   Skin: warm and dry, no rash Neuro:  Strength and sensation are intact Psych: euthymic mood, full affect   EKG:  EKG is ordered today.   Lipid Panel No results found for: CHOL, TRIG, HDL, CHOLHDL, VLDL, LDLCALC, LDLDIRECT    Wt Readings from Last 3 Encounters:  05/11/16 177 lb 6.4 oz (80.5 kg)  03/18/16 165 lb (74.8 kg)  09/06/15 173 lb (78.5 kg)      ASSESSMENT AND PLAN:  1  Autonomic dysfunction   Pt continues to have some dizziness  She also is botherted by  signif HA   On exma today  BP actually increases with standing  Pulse increaed but not significantly I would keep on same meds   I also think she should be eval in the HA center  2.  Achiness, cramps  Will check labs (CK, ANA)  Also contact Rheumatology       Current medicines are reviewed at length with the patient today.  The patient does not have concerns regarding medicine  Signed, Dorris Carnes, MD  05/11/2016 9:37 AM    [address], [place], [place]  [address] Phone: [phone]; Fax: [phone]

## 2016-05-12 ENCOUNTER — Other Ambulatory Visit: Payer: Self-pay | Admitting: Internal Medicine

## 2016-05-12 LAB — BASIC METABOLIC PANEL
BUN / CREAT RATIO: 15 (ref 9–23)
BUN: 11 mg/dL (ref 6–24)
CALCIUM: 9.4 mg/dL (ref 8.7–10.2)
CHLORIDE: 100 mmol/L (ref 96–106)
CO2: 23 mmol/L (ref 18–29)
Creatinine, Ser: 0.72 mg/dL (ref 0.57–1.00)
GFR calc non Af Amer: 104 mL/min/{1.73_m2} (ref >59)
GFR, EST AFRICAN AMERICAN: 120 mL/min/{1.73_m2} (ref >59)
Glucose: 92 mg/dL (ref 65–99)
POTASSIUM: 4.3 mmol/L (ref 3.5–5.2)
SODIUM: 140 mmol/L (ref 134–144)

## 2016-05-12 LAB — CBC
Hematocrit: 39.1 % (ref 34.0–46.6)
Hemoglobin: 13 g/dL (ref 11.1–15.9)
MCH: 27.5 pg (ref 26.6–33.0)
MCHC: 33.2 g/dL (ref 31.5–35.7)
MCV: 83 fL (ref 79–97)
PLATELETS: 254 10*3/uL (ref 150–379)
RBC: 4.72 x10E6/uL (ref 3.77–5.28)
RDW: 13.5 % (ref 12.3–15.4)
WBC: 6.4 10*3/uL (ref 3.4–10.8)

## 2016-05-12 LAB — SEDIMENTATION RATE: SED RATE: 2 mm/h (ref 0–32)

## 2016-05-12 LAB — CK: CK TOTAL: 81 U/L (ref 24–173)

## 2016-05-21 NOTE — Telephone Encounter (Signed)
Please refill per Dr. Harrington Challenger.

## 2016-05-28 ENCOUNTER — Other Ambulatory Visit: Payer: Self-pay | Admitting: Internal Medicine

## 2016-06-06 ENCOUNTER — Ambulatory Visit (INDEPENDENT_AMBULATORY_CARE_PROVIDER_SITE_OTHER): Payer: BLUE CROSS/BLUE SHIELD | Admitting: Family Medicine

## 2016-06-06 ENCOUNTER — Encounter: Payer: Self-pay | Admitting: Family Medicine

## 2016-06-06 VITALS — BP 116/80 | HR 85 | Resp 12 | Ht 66.0 in | Wt 178.4 lb

## 2016-06-06 DIAGNOSIS — R110 Nausea: Secondary | ICD-10-CM

## 2016-06-06 DIAGNOSIS — M546 Pain in thoracic spine: Secondary | ICD-10-CM | POA: Diagnosis not present

## 2016-06-06 LAB — POCT URINALYSIS DIPSTICK
Bilirubin, UA: NEGATIVE
Glucose, UA: NEGATIVE
KETONES UA: NEGATIVE
LEUKOCYTES UA: NEGATIVE
NITRITE UA: NEGATIVE
PH UA: 6
PROTEIN UA: NEGATIVE
RBC UA: NEGATIVE
Spec Grav, UA: 1.02
Urobilinogen, UA: 0.2

## 2016-06-06 LAB — POCT URINE PREGNANCY: Preg Test, Ur: NEGATIVE

## 2016-06-06 MED ORDER — BACLOFEN 10 MG PO TABS: 10.0000 mg | ORAL_TABLET | Freq: Two times a day (BID) | ORAL | 0 refills | Status: AC | PRN

## 2016-06-06 NOTE — Progress Notes (Signed)
Pre visit review using our clinic review tool, if applicable. No additional management support is needed unless otherwise documented below in the visit note. 

## 2016-06-06 NOTE — Patient Instructions (Signed)
  [name] I have seen you today for an acute visit.  A few things to remember from today's visit:   Nausea without vomiting - Plan: POCT urinalysis dipstick  Left-sided thoracic back pain, unspecified chronicity - Plan: POCT urinalysis dipstick, baclofen (LIORESAL) 10 MG tablet     Urine today negative for blood.  Monitor for new symptoms.  Medications prescribed today are intended for short period of time and will not be refill upon request, a follow up appointment might be necessary to discuss continuation of of treatment if appropriate.     In general please monitor for signs of worsening symptoms and seek immediate medical attention if any concerning.  If symptoms are not resolved in 2-3 weeks you should schedule a follow up appointment with your doctor, before if needed.  Please be sure you have an appointment already scheduled with your PCP before you leave today.

## 2016-06-06 NOTE — Progress Notes (Signed)
HPI:   ACUTE VISIT:  Chief Complaint  Patient presents with  . pain in back    [name] is a 41 y.o. female, who is here today complaining of 3 days of left mid back pain (lower thoracic), dull, 5/10,no radiated, no associated numbness or tingling, and no rash. Pain is intermittent. She has not identified exacerbating or alleviating factors. She has not taken OTC medication for pain.  Usually it goes away spontaneously.  She wonders if this pain is related to her kidney, she denies Hx of nephrolithiasis.   She has had lower back pain but no pain on this area in particular.  In the past she has taken Flexeril for lower back pain. History of anxiety, currently she is on Celexa.   She denies fever, chills, hematuria, urinary frequency, or dysuria.  He also mentions that she had  nausea yesterday. She has history of GERD and "esophageal paralysis", so is not a infrequent for her to be nauseated. She is also reporting history of "autonomic disorder", so she is not sure if symptoms are related to this problem.   Denies abdominal pain, vomiting, changes in bowel habits, blood in stool or melena. LMP 3-4 weeks ago. She is on OCP's.   Review of Systems  Constitutional: Positive for fatigue (No more than usual). Negative for appetite change, fever and unexpected weight change.  HENT: Negative for mouth sores, sore throat, trouble swallowing and voice change.   Respiratory: Negative for cough, shortness of breath and wheezing.   Cardiovascular: Negative for chest pain and palpitations.  Gastrointestinal: Positive for nausea. Negative for abdominal pain and vomiting.       No changes in bowel habits.  Genitourinary: Negative for dysuria, flank pain, frequency, hematuria and vaginal bleeding.  Musculoskeletal: Positive for back pain. Negative for joint swelling and neck pain.  Skin: Negative for rash.  Neurological: Negative for weakness and numbness.    Psychiatric/Behavioral: Negative for confusion. The patient is nervous/anxious.       Current Outpatient Prescriptions on File Prior to Visit  Medication Sig Dispense Refill  . citalopram (CELEXA) 20 MG tablet TAKE 1 TABLET(20 MG) BY MOUTH DAILY 90 tablet 1  . docusate sodium (COLACE) 100 MG capsule Take 100 mg by mouth daily.     . drospirenone-ethinyl estradiol (YASMIN,ZARAH,SYEDA) 3-0.03 MG tablet Take 1 tablet by mouth every evening.    . iron polysaccharides (NU-IRON) 150 MG capsule Take 150 mg by mouth daily.      . Lactobacillus-Inulin (Castana PO) Take by mouth as directed.    . magnesium oxide (MAGOX 400) 400 (241.3 Mg) MG tablet Take 1 tablet (400 mg total) by mouth daily. 90 tablet 3  . ondansetron (ZOFRAN) 4 MG tablet Take 1 tablet (4 mg total) by mouth every 8 (eight) hours as needed for nausea or vomiting. 27 tablet 0  . pantoprazole (PROTONIX) 40 MG tablet TAKE 1 TABLET BY MOUTH EVERY DAY 90 tablet 1  . pindolol (VISKEN) 5 MG tablet TAKE 1 TABLET(5 MG) BY MOUTH DAILY 30 tablet 11  . pseudoephedrine (SUDAFED) 30 MG tablet Take 30 mg by mouth daily as needed for congestion. For nasal congestion    . pyridostigmine (MESTINON) 60 MG tablet Take 1 tablet (60 mg total) by mouth 2 (two) times daily. 60 tablet 5  . rizatriptan (MAXALT) 10 MG tablet TAKE 1 TABLET BY MOUTH AT BEDTIME AS NEEDED FOR MIGRAINE. MAY REPEAT IN 2 HOURS AS NEEDED. MAXIMUM OF 30  MG 3 TABLETS IN 24 HOURS 12 tablet 1  . triamcinolone (NASACORT AQ) 55 MCG/ACT nasal inhaler Place 2 sprays into both nostrils daily as needed (Patient taking 2 sprays in each nare daily as need for seasonal alllergies). For stuffiness    . [DISCONTINUED] magnesium oxide (MAG-OX) 400 MG tablet Take 1 tablet (400 mg total) by mouth daily. 30 tablet 5   No current facility-administered medications on file prior to visit.      Past Medical History:  Diagnosis Date  . ALLERGIC RHINITIS   . ANEMIA-NOS   . Anxiety    . Delayed gastric emptying    dx at [facility] by gastric emptying study per patient  . DYSAUTONOMIA   . ENDOMETRIOSIS   . GERD (gastroesophageal reflux disease) 10/06/2011  . Headache(784.0)   . Irritable bowel syndrome   . Lumbar disc disease   . Migraine   . POTS (postural orthostatic tachycardia syndrome)    Allergies  Allergen Reactions  . Adhesive [Tape] Anaphylaxis and Rash  . Sulfonamide Derivatives Nausea And Vomiting  . Doxycycline Nausea And Vomiting  . Amoxicillin Nausea And Vomiting and Rash  . Erythromycin Nausea And Vomiting and Rash  . Latex Rash  . Penicillins Nausea And Vomiting and Rash    Social History   Social History  . Marital status: Married    Spouse name: N/A  . Number of children: 1  . Years of education: N/A   Occupational History  . Property Management Self Employed   Social History Main Topics  . Smoking status: Former Smoker    Quit date: 04/30/1996  . Smokeless tobacco: Never Used  . Alcohol use Yes     Comment: weekly  . Drug use: No  . Sexual activity: Yes    Birth control/ protection: Pill   Other Topics Concern  . 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  Body mass index is 28.79 kg/m.  Physical Exam  Nursing note and vitals reviewed. Constitutional: She is oriented to person, place, and time. She appears well-developed. She does not appear ill. No distress.  HENT:  Head: Atraumatic.  Mouth/Throat: Uvula is midline. Mucous membranes are dry (Mild). No posterior oropharyngeal edema or posterior oropharyngeal erythema.  Eyes: Conjunctivae and EOM are normal.  Respiratory: Effort normal and breath sounds normal. No respiratory distress.  GI: Soft.  She exhibits no mass. There is no hepatomegaly. There is no tenderness.  Musculoskeletal: She exhibits no edema.       Thoracic back: She exhibits no bony tenderness.       Lumbar back: She exhibits no bony tenderness.  No significant deformity appreciated. No tenderness upon palpation of paraspinal muscles. Pain is not elicited with movement on exam table during examination. No local edema or erythema appreciated, no suspicious lesions.  Lymphadenopathy:    She has no cervical adenopathy.  Neurological: She is alert and oriented to person, place, and time. She has normal strength. Coordination and gait normal.  Skin: Skin is warm. No rash noted. No erythema.  Psychiatric: Her mood appears anxious.  Well groomed, good eye contact.      ASSESSMENT  AND PLAN:     Chelsey Sanford was seen today for pain in back.  Diagnoses and all orders for this visit:   Nausea without vomiting  Possible causes discussed, he can certainly be related to her chronic headache problems.        09/29/14 she had abdominal U/S:Negative for cholelithiasis. Right Kidney: Length: 11.0 cm. Echogenicity within normal limits. No mass or hydronephrosis visualized.Left Kidney: Length: 10.7 cm. Echogenicity within normal limits. No mass or hydronephrosis visualized.   monitor for new associated symptoms. No changes on her PPI (Protonix) and continue Zofran as needed (on her med list).  -     POCT urinalysis dipstick -     POCT urine pregnancy  Left-sided thoracic back pain, unspecified chronicity  It seems musculoskeletal. I do not think it is related to "kidneys", which was her concern today.  I don't think imaging is needed at this time. He agrees with trying Baclofen 10 mg, some side effects discussed. Local heat and/or topical Icy Hot with lidocaine. Instructed about warning signs.    Lumbar MRI 10/2009: Stable chronic L4-L5 disc degeneration with small broad-based disc protrusion and annular tear. Chronic  L5-S1 disc degeneration with interval healing of left lateral recess protrusion.    -     POCT urinalysis dipstick -     baclofen (LIORESAL) 10 MG tablet; Take 1 tablet (10 mg total) by mouth 2 (two) times daily as needed for muscle spasms.       -Ms.Tulip Donlan was advised to return or notify a doctor immediately if symptoms worsen or persist or new concerns arise.       Belal Scallon G. [place], MD  [facility]. [place] office.

## 2016-06-28 ENCOUNTER — Other Ambulatory Visit: Payer: Self-pay | Admitting: Internal Medicine

## 2016-07-07 NOTE — Progress Notes (Deleted)
Office Visit Note  Patient: Chelsey Sanford             Date of Birth: [birth year]           MRN: [id number]             PCP: Binnie Rail, MD Referring: Binnie Rail, MD Visit Date: 07/10/2016 Occupation: @GUAROCC @    Subjective:  No chief complaint on file.   History of Present Illness: Chelsey Sanford is a 41 y.o. female ***   Activities of Daily Living:  Patient reports morning stiffness for *** {minute/hour:19697}.   Patient {ACTIONS;DENIES/REPORTS:21021675::"Denies"} nocturnal pain.  Difficulty dressing/grooming: {ACTIONS;DENIES/REPORTS:21021675::"Denies"} Difficulty climbing stairs: {ACTIONS;DENIES/REPORTS:21021675::"Denies"} Difficulty getting out of chair: {ACTIONS;DENIES/REPORTS:21021675::"Denies"} Difficulty using hands for taps, buttons, cutlery, and/or writing: {ACTIONS;DENIES/REPORTS:21021675::"Denies"}   No Rheumatology ROS completed.   PMFS History:  Patient Active Problem List   Diagnosis Date Noted  . Dysautonomia 05/16/2015  . B12 deficiency 05/16/2015  . Superficial phlebitis 04/29/2013  . Contact dermatitis 09/03/2012  . Migraine headache   . GERD (gastroesophageal reflux disease) 10/06/2011  . Vertigo 10/05/2011  . ANXIETY 05/03/2010  . ALLERGIC RHINITIS 05/03/2010  . ANEMIA-NOS 12/26/2007  . POTS (postural orthostatic tachycardia syndrome) 12/26/2007  . Irritable bowel syndrome 09/08/2007  . ENDOMETRIOSIS 07/21/2007    Past Medical History:  Diagnosis Date  . ALLERGIC RHINITIS   . ANEMIA-NOS   . Anxiety   . Delayed gastric emptying    dx at [facility] by gastric emptying study per patient  . DYSAUTONOMIA   . ENDOMETRIOSIS   . GERD (gastroesophageal reflux disease) 10/06/2011  . Headache(784.0)   . Irritable bowel syndrome   . Lumbar disc disease   . Migraine   . POTS (postural orthostatic tachycardia syndrome)     Family History  Problem Relation Age of Onset  . Thyroid disease Mother     hypothyroidism  . Migraines Mother   . Colon polyps  Mother   . Skin cancer Mother   . Crohn's disease Maternal Grandmother   . COPD Neg Hx   . Colon cancer Neg Hx    Past Surgical History:  Procedure Laterality Date  . ANKLE RECONSTRUCTION  1993  . ELBOW SURGERY Right 2015  . TONSILLECTOMY  1996  . 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   Physical Exam   Musculoskeletal Exam: ***  CDAI Exam: No CDAI exam completed.    Investigation: Findings:  09/08/2015 ANA negative, rheumatoid factor negative, ESR 4, TSH normal, CMP normal, myoglobin normal In 10/03/2015 ESR 1 CK 136 myoglobin normal CMP normal TSH normal, rheumatid factor negative 05/11/2016 CK 81 ESR 2, CBC normal, BMP normal    Imaging: No results found.  Speciality Comments: No specialty comments available.    Procedures:  No procedures performed Allergies: Adhesive [tape]; Sulfonamide derivatives; Doxycycline; Amoxicillin; Erythromycin; Latex; and Penicillins   Assessment / Plan:     Visit Diagnoses: No diagnosis found.    Orders: No orders of the defined types were placed in this encounter.  No orders of the defined types were placed in this encounter.   Face-to-face time spent with patient was *** minutes.  50% of time was spent in counseling and coordination of care.  Follow-Up Instructions: No Follow-up on file.   Bo Merino, MD  Note - This record has been created using Editor, commissioning.  Chart creation errors have been sought, but may not always  have been located. Such creation errors do not reflect on  the standard of medical care.

## 2016-07-09 ENCOUNTER — Other Ambulatory Visit: Payer: Self-pay | Admitting: Internal Medicine

## 2016-07-10 ENCOUNTER — Ambulatory Visit: Payer: Self-pay | Admitting: Rheumatology

## 2016-07-10 DIAGNOSIS — M47816 Spondylosis without myelopathy or radiculopathy, lumbar region: Secondary | ICD-10-CM | POA: Insufficient documentation

## 2016-07-10 NOTE — Progress Notes (Signed)
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 female seen in consultation per request of her PCP. According to patient she was diagnosed with dysautonomia 11 years ago. She states she's had problems with tachycardia and his increased muscle pain since then. She has had increased generalized pain over the last 2 years. She describes increased pain in her muscles and her joints. It is difficult for her to walk in the morning due to feet discomfort. She has morning stiffness and also stiffness after prolonged sitting. She describes pain in her bilateral thumb more so in the right than the left right foot and bilateral hip joints. She also has neck and lower back pain. She was diagnosed with disc disease of lumbar spine. She denies any joint swelling. She has chronic insomnia at least for 11 years. She states the insomnia is due to nocturnal pain, muscle cramps and also her knee to drink water at night. She describes her pain on scale of 0-10 about one to 5 and fatigue about 8.  Activities of Daily Living:  Patient reports morning stiffness for 30-45 minutes.   Patient Reports nocturnal pain.  Difficulty dressing/grooming: Denies Difficulty climbing stairs: Denies Difficulty getting out of chair: Denies Difficulty using hands for taps, buttons, cutlery, and/or writing: Denies   Review of Systems  Constitutional: Positive for fatigue. Negative for night sweats, weight gain, weight loss and weakness.  HENT: Positive for mouth dryness. Negative for mouth sores, trouble swallowing, trouble swallowing and nose dryness.   Eyes: Negative for pain, redness, visual disturbance and dryness.  Respiratory: Negative for  cough, shortness of breath and difficulty breathing.   Cardiovascular: Positive for palpitations. Negative for chest pain, hypertension, irregular heartbeat and swelling in legs/feet.  Gastrointestinal: Positive for constipation and diarrhea. Negative for blood in stool.       History of IBS  Endocrine: Negative for increased urination.  Genitourinary: Positive for nocturia. Negative for vaginal dryness.  Musculoskeletal: Positive for arthralgias, joint pain, myalgias, morning stiffness and myalgias. Negative for joint swelling, muscle weakness and muscle tenderness.  Skin: Positive for sensitivity to sunlight. Negative for color change, rash, hair loss, skin tightness and ulcers.  Allergic/Immunologic: Negative for susceptible to infections.  Neurological: Negative for dizziness, memory loss and night sweats.  Hematological: Negative for swollen glands.  Psychiatric/Behavioral: Positive for sleep disturbance. Negative for depressed mood.    PMFS History:  Patient Active Problem List   Diagnosis Date Noted  . 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 Dysautonomia 05/16/2015  . B12 deficiency 05/16/2015  . Superficial phlebitis 04/29/2013  . Contact dermatitis 09/03/2012  . Migraine headache   . GERD (gastroesophageal reflux disease) 10/06/2011  . Vertigo 10/05/2011  . Anxiety state 05/03/2010  . ALLERGIC RHINITIS 05/03/2010  . ANEMIA-NOS 12/26/2007  . POTS (postural orthostatic tachycardia syndrome) 12/26/2007  . Irritable bowel syndrome 09/08/2007  . ENDOMETRIOSIS 07/21/2007    Past Medical History:  Diagnosis Date  . ALLERGIC RHINITIS   . ANEMIA-NOS   . Anxiety   .  Delayed gastric emptying    dx at [facility] by gastric emptying study per patient  . DYSAUTONOMIA   . ENDOMETRIOSIS   . GERD (gastroesophageal reflux disease) 10/06/2011  . Headache(784.0)   . Irritable bowel syndrome   . Lumbar disc disease   . Migraine   . POTS (postural  orthostatic tachycardia syndrome)     Family History  Problem Relation Age of Onset  . Thyroid disease Mother     hypothyroidism  . Migraines Mother   . Colon polyps Mother   . Skin cancer Mother   . Crohn's disease Maternal Grandmother   . COPD Neg Hx   . Colon cancer Neg Hx    Past Surgical History:  Procedure Laterality Date  . ANKLE RECONSTRUCTION  1993  . ELBOW SURGERY Right 2015  . TONSILLECTOMY  1996  . 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 She appears well-developed and well-nourished.  HENT:  Head: Normocephalic and atraumatic.  Eyes: Conjunctivae and EOM are normal.  Neck: Normal range of motion.  Cardiovascular: Normal rate, regular rhythm, normal heart sounds and intact distal pulses.   Pulmonary/Chest: Effort normal and breath sounds normal.  Abdominal: Soft. Bowel sounds are normal.  Lymphadenopathy:    She has no cervical adenopathy.  Neurological: She is alert and oriented to person, place, and time.  Skin: Skin is warm and dry. Capillary refill takes less than 2 seconds.  Psychiatric: She has a normal mood and affect. Her behavior is normal.  Nursing note and vitals reviewed.    Musculoskeletal Exam: C-spine, thoracic, lumbar spine good range of motion. No SI joint tenderness. Shoulder joints elbow joints wrist joint MCPs PIPs DIPs with good range of motion with no synovitis. She is tenderness on palpation over bilateral first MCP joint. Hip joints were good range of motion. Knee joints ankles joints MTPs PIPs DIPs  with good range of motion. She is some tenderness over base of her second and third MTP on the right foot. Some calluses were also noted on the bottom of her feet. Fibromyalgia tender points only 2 out of 18 positive.  CDAI Exam: No CDAI exam completed.    Investigation: Findings:  09/08/2015 ANA negative, rheumatoid factor negative, ESR 4, TSH normal, CMP normal, myoglobin normal In 10/03/2015 ESR 1 CK 136 myoglobin normal CMP normal TSH normal, rheumatid factor negative 05/11/2016 CK 81 ESR 2, CBC normal, BMP normal    Imaging: Xr Foot 2 Views Left  Result Date: 07/13/2016 No MTP PIP/DIP joint narrowing was noted. No erosive changes were noted. Impression: Normal x-ray of the foot.  Xr Foot 2 Views Right  Result Date: 07/13/2016 No MTP PIP/DIP joint narrowing was noted. No erosive changes were noted. Impression: Normal x-ray of the foot.  Xr Hand 2 View Left  Result Date: 07/13/2016 No MCP PIP or DIP narrowing was noted. No erosive changes were noted. Impression: Normal x-ray of the hand.  Xr Hand 2 View Right  Result Date: 07/13/2016 No MCP PIP or DIP narrowing was noted. No erosive changes were noted. Impression: Normal x-ray of the hand.  Xr Pelvis 1-2 Views  Result Date: 07/13/2016 X-ray of the pelvis did not show any joint space narrowing or sclerosis in the SI joints. Hip joints also appeared normal.   Speciality Comments: No specialty comments available.    Procedures:  No procedures performed Allergies: Adhesive [tape]; Sulfonamide derivatives; Doxycycline; Amoxicillin; Erythromycin; Latex; and Penicillins   Assessment / Plan:     Visit Diagnoses: Spondylosis of lumbar region without myelopathy or radiculopathy: She has some ongoing pain and discomfort in her lumbar region.  Pain in both hands -she is having some tenderness over her MCP joints but no synovitis was noted. Plan: XR Hand 2 View Left, XR Hand 2 View Right. X-rays were unremarkable  Pain in both  feet -she also describes pain or MTP joints but no synovitis was noted. Plan: XR Foot 2 Views Left, XR Foot 2 Views Right. X-rays were within normal limits  Pain of both hip joints -her hip joints are good range of motion. She had mild tenderness over trochanteric area. Plan: XR Pelvis 1-2 Views. X-ray was within normal limits  Livedo reticularis: I'll obtain beta-2, anticardiolipin and lupus anticoagulant today. She also gives history of sicca symptoms and photosensitivity.  Recurrent contact dermatitis   History of IBS  POTS (postural orthostatic tachycardia syndrome)  Gastroesophageal reflux  History of migraine    Orders: Orders Placed This Encounter  Procedures  . XR Hand 2 View Left  . XR Foot 2 Views Left  . XR Hand 2 View Right  . XR Foot 2 Views Right  . XR Pelvis 1-2 Views  . Antinuclear Antib (ANA)  . [id number] ENA PANEL  . Beta-2 glycoprotein antibodies  . Cardiolipin antibodies, IgG, IgM, IgA  . Lupus anticoagulant panel  . Cyclic citrul peptide antibody, IgG  . Serum protein electrophoresis with reflex  . IgG, IgA, IgM  . VITAMIN D 25 Hydroxy (Vit-D Deficiency, Fractures)   No orders of the defined types were placed in this encounter.   Face-to-face time spent with patient was 45 minutes. 50% of time was spent in counseling and coordination of care.  Follow-Up Instructions: Return for Polyarthralgia.   Bo Merino, MD  Note - This record has been created using Editor, commissioning.  Chart creation errors have been sought, but may not always  have been located. Such creation errors do not reflect on  the standard of medical care.

## 2016-07-13 ENCOUNTER — Ambulatory Visit (INDEPENDENT_AMBULATORY_CARE_PROVIDER_SITE_OTHER): Payer: Self-pay

## 2016-07-13 ENCOUNTER — Ambulatory Visit (INDEPENDENT_AMBULATORY_CARE_PROVIDER_SITE_OTHER): Payer: BLUE CROSS/BLUE SHIELD | Admitting: Rheumatology

## 2016-07-13 ENCOUNTER — Encounter: Payer: Self-pay | Admitting: Rheumatology

## 2016-07-13 VITALS — BP 122/60 | HR 78 | Resp 14 | Ht 66.0 in | Wt 172.0 lb

## 2016-07-13 DIAGNOSIS — M79672 Pain in left foot: Secondary | ICD-10-CM

## 2016-07-13 DIAGNOSIS — M545 Low back pain, unspecified: Secondary | ICD-10-CM

## 2016-07-13 DIAGNOSIS — Z1321 Encounter for screening for nutritional disorder: Secondary | ICD-10-CM

## 2016-07-13 DIAGNOSIS — K219 Gastro-esophageal reflux disease without esophagitis: Secondary | ICD-10-CM

## 2016-07-13 DIAGNOSIS — L2389 Allergic contact dermatitis due to other agents: Secondary | ICD-10-CM

## 2016-07-13 DIAGNOSIS — M79671 Pain in right foot: Secondary | ICD-10-CM

## 2016-07-13 DIAGNOSIS — Z8719 Personal history of other diseases of the digestive system: Secondary | ICD-10-CM | POA: Diagnosis not present

## 2016-07-13 DIAGNOSIS — M47816 Spondylosis without myelopathy or radiculopathy, lumbar region: Secondary | ICD-10-CM | POA: Diagnosis not present

## 2016-07-13 DIAGNOSIS — R5383 Other fatigue: Secondary | ICD-10-CM

## 2016-07-13 DIAGNOSIS — M25551 Pain in right hip: Secondary | ICD-10-CM

## 2016-07-13 DIAGNOSIS — M79642 Pain in left hand: Secondary | ICD-10-CM

## 2016-07-13 DIAGNOSIS — I951 Orthostatic hypotension: Secondary | ICD-10-CM

## 2016-07-13 DIAGNOSIS — G43809 Other migraine, not intractable, without status migrainosus: Secondary | ICD-10-CM | POA: Diagnosis not present

## 2016-07-13 DIAGNOSIS — M79641 Pain in right hand: Secondary | ICD-10-CM

## 2016-07-13 DIAGNOSIS — G8929 Other chronic pain: Secondary | ICD-10-CM | POA: Diagnosis not present

## 2016-07-13 DIAGNOSIS — G90A Postural orthostatic tachycardia syndrome (POTS): Secondary | ICD-10-CM

## 2016-07-13 DIAGNOSIS — M25552 Pain in left hip: Secondary | ICD-10-CM | POA: Diagnosis not present

## 2016-07-13 DIAGNOSIS — R000 Tachycardia, unspecified: Secondary | ICD-10-CM | POA: Diagnosis not present

## 2016-07-14 LAB — CP5000020 ENA PANEL
ENA SM AB SER-ACNC: NEGATIVE
Ribonucleic Protein(ENA) Antibody, IgG: <1
SSA (Ro) (ENA) Antibody, IgG: <1
SSB (LA) (ENA) ANTIBODY, IGG: NEGATIVE
Scleroderma (Scl-70) (ENA) Antibody, IgG: <1

## 2016-07-14 LAB — CYCLIC CITRUL PEPTIDE ANTIBODY, IGG: Cyclic Citrullin Peptide Ab: 25 Units — ABNORMAL HIGH

## 2016-07-14 LAB — VITAMIN D 25 HYDROXY (VIT D DEFICIENCY, FRACTURES): VIT D 25 HYDROXY: 25 ng/mL — AB (ref 30–100)

## 2016-07-15 NOTE — Progress Notes (Signed)
Vitamin D 50,000 units once a week for 90 days. Check vitamin D level in 3 months

## 2016-07-16 LAB — IGG, IGA, IGM
IGA: 142 mg/dL (ref 81–463)
IGM, SERUM: 97 mg/dL (ref 48–271)
IgG (Immunoglobin G), Serum: 1042 mg/dL (ref 694–1618)

## 2016-07-16 LAB — LUPUS ANTICOAGULANT PANEL

## 2016-07-16 LAB — RFX DRVVT SCR W/RFLX CONF 1:1 MIX: DRVVT SCREEN: 36 s (ref <=45)

## 2016-07-16 LAB — CARDIOLIPIN ANTIBODIES, IGG, IGM, IGA
Anticardiolipin IgG: <14 [GPL'U]
Anticardiolipin IgM: <12 [MPL'U]

## 2016-07-16 LAB — RFX PTT-LA W/RFX TO HEX PHASE CONF: PTT-LA Screen: 32 s (ref <=40)

## 2016-07-16 LAB — ANA: Anti Nuclear Antibody(ANA): NEGATIVE

## 2016-07-17 ENCOUNTER — Telehealth: Payer: Self-pay | Admitting: *Deleted

## 2016-07-17 LAB — PROTEIN ELECTROPHORESIS, SERUM, WITH REFLEX
ALPHA-1-GLOBULIN: 0.3 g/dL (ref 0.2–0.3)
ALPHA-2-GLOBULIN: 0.5 g/dL (ref 0.5–0.9)
Albumin ELP: 3.6 g/dL — ABNORMAL LOW (ref 3.8–4.8)
BETA GLOBULIN: 0.5 g/dL (ref 0.4–0.6)
Beta 2: 0.3 g/dL (ref 0.2–0.5)
Gamma Globulin: 1 g/dL (ref 0.8–1.7)
Total Protein, Serum Electrophoresis: 6.2 g/dL (ref 6.1–8.1)

## 2016-07-17 LAB — BETA-2 GLYCOPROTEIN ANTIBODIES: Beta-2 Glyco I IgG: <9 SGU (ref <=20)

## 2016-07-17 MED ORDER — VITAMIN D (ERGOCALCIFEROL) 1.25 MG (50000 UNIT) PO CAPS: 50000.0000 [IU] | ORAL_CAPSULE | ORAL | 0 refills | Status: DC

## 2016-07-17 NOTE — Telephone Encounter (Signed)
-----   Message from Bo Merino, MD sent at 07/15/2016  9:11 PM EDT ----- Vitamin D 50,000 units once a week for 90 days. Check vitamin D level in 3 months

## 2016-07-17 NOTE — Progress Notes (Signed)
Positive CCP which could be associated with rheumatoid arthritis.Schedule ultrasound of bilateral hands.

## 2016-07-18 ENCOUNTER — Other Ambulatory Visit: Payer: Self-pay | Admitting: Internal Medicine

## 2016-07-20 ENCOUNTER — Telehealth: Payer: Self-pay | Admitting: Radiology

## 2016-07-20 NOTE — Telephone Encounter (Signed)
-----   Message from Bo Merino, MD sent at 07/17/2016  4:52 PM EDT ----- Positive CCP which could be associated with rheumatoid arthritis.Schedule ultrasound of bilateral hands.

## 2016-07-20 NOTE — Telephone Encounter (Signed)
I have called patient to advise her we will call her to make appt for the [place]. She states she would be able to do this, but it has to be in the early afternoon. Will you please call her to make appt for [place]?

## 2016-07-22 ENCOUNTER — Encounter: Payer: Self-pay | Admitting: Internal Medicine

## 2016-07-23 ENCOUNTER — Other Ambulatory Visit: Payer: Self-pay | Admitting: *Deleted

## 2016-07-23 MED ORDER — CITALOPRAM HYDROBROMIDE 20 MG PO TABS: 20.0000 mg | ORAL_TABLET | Freq: Every day | ORAL | 1 refills | Status: DC

## 2016-07-23 NOTE — Telephone Encounter (Signed)
The first available [place] that we have is 09/05/16. Is this okay or does it need to be scheduled sooner?

## 2016-07-23 NOTE — Telephone Encounter (Signed)
Please refill Celexa

## 2016-07-23 NOTE — Telephone Encounter (Signed)
Message       ----- Message -----  From: Fay Records, MD  Sent: 07/23/2016  2:05 PM  To: Rodman Key, RN  Subject: Non-Urgent Medical Question             ----- Message from Fay Records, MD sent at 07/23/2016 2:05 PM EDT -----      ----- Message from [name] to Fay Records, MD sent at 07/22/2016 2:21 PM -----   Hi Dr. Harrington Challenger,    I am on citalipram (celexa). As part of the regimen for managing POTS/Dysautonomia. I have been out for a few weeks now and for some reason I cannot get a refill. Can you approve the refill? I got a message that I have to come in for an appointment to get the refill from Celso Amy, my PCP. I feel a marked difference in my brain fog while I have been off the prescription- everything just feels a little out of sync.    Thanks,  [facility]      Chestina Komatsu  07/22/2016  Patient Email  MRN:  [id number]  Description: 41 year old female Provider: Fay Records, MD Department: [address] [facility]          Call Documentation   Fay Records, MD at 07/23/2016 2:05 PM   Status: Signed    Please refill Celexa    Encounter MyChart Messages   Read Composed From To Subject  Y 07/22/2016 2:21 PM Selenne Caesar Chestnut, MD Non-Urgent Medical Question  Routing History   Priority Sent On From To Message Type   07/23/2016 5:00 PM Rodman Key, RN P Cv Div Ch St Refill Pt Advice Request   07/23/2016 2:05 PM Fay Records, MD Rodman Key, RN Pt Advice Request   07/23/2016 9:07 AM Silverio Lay, RN Fay Records, MD Pt Advice Request   Rodman Key, RN    07/22/2016 2:21 PM Mychart, Generic P Cv Div Ch [address] [place]

## 2016-07-23 NOTE — Telephone Encounter (Addendum)
Per Dr. Estanislado Pandy if patient's symptom's are worsening.  then we can see her sooner, if not then okay to wait. Patient states she is okay to wait. Patient is unable to make it on 08/28/16 but has an appointment scheduled for 09/12/16.

## 2016-07-28 ENCOUNTER — Emergency Department (HOSPITAL_COMMUNITY)
Admission: EM | Admit: 2016-07-28 | Discharge: 2016-07-28 | Disposition: A | Discharge status: Home / Self Care | Payer: BLUE CROSS/BLUE SHIELD | Attending: Physician Assistant | Admitting: Physician Assistant

## 2016-07-28 ENCOUNTER — Emergency Department (HOSPITAL_COMMUNITY): Payer: BLUE CROSS/BLUE SHIELD

## 2016-07-28 ENCOUNTER — Encounter (HOSPITAL_COMMUNITY): Payer: Self-pay | Admitting: *Deleted

## 2016-07-28 DIAGNOSIS — R112 Nausea with vomiting, unspecified: Secondary | ICD-10-CM | POA: Diagnosis not present

## 2016-07-28 DIAGNOSIS — Z91040 Latex allergy status: Secondary | ICD-10-CM | POA: Diagnosis not present

## 2016-07-28 DIAGNOSIS — R1013 Epigastric pain: Secondary | ICD-10-CM | POA: Diagnosis not present

## 2016-07-28 DIAGNOSIS — R197 Diarrhea, unspecified: Secondary | ICD-10-CM

## 2016-07-28 DIAGNOSIS — Z87891 Personal history of nicotine dependence: Secondary | ICD-10-CM | POA: Diagnosis not present

## 2016-07-28 DIAGNOSIS — R1011 Right upper quadrant pain: Secondary | ICD-10-CM | POA: Diagnosis not present

## 2016-07-28 DIAGNOSIS — R109 Unspecified abdominal pain: Secondary | ICD-10-CM | POA: Diagnosis not present

## 2016-07-28 LAB — URINALYSIS, ROUTINE W REFLEX MICROSCOPIC
BILIRUBIN URINE: NEGATIVE
GLUCOSE, UA: NEGATIVE mg/dL
HGB URINE DIPSTICK: NEGATIVE
KETONES UR: NEGATIVE mg/dL
Leukocytes, UA: NEGATIVE
Nitrite: NEGATIVE
PH: 6 (ref 5.0–8.0)
PROTEIN: NEGATIVE mg/dL
Specific Gravity, Urine: 1.008 (ref 1.005–1.030)

## 2016-07-28 LAB — CBC
HCT: 38 % (ref 36.0–46.0)
HEMOGLOBIN: 12.8 g/dL (ref 12.0–15.0)
MCH: 25.9 pg — ABNORMAL LOW (ref 26.0–34.0)
MCHC: 33.7 g/dL (ref 30.0–36.0)
MCV: 76.9 fL — ABNORMAL LOW (ref 78.0–100.0)
PLATELETS: 239 10*3/uL (ref 150–400)
RBC: 4.94 MIL/uL (ref 3.87–5.11)
RDW: 12.2 % (ref 11.5–15.5)
WBC: 7.5 10*3/uL (ref 4.0–10.5)

## 2016-07-28 LAB — COMPREHENSIVE METABOLIC PANEL
ALBUMIN: 3.2 g/dL — AB (ref 3.5–5.0)
ALT: 18 U/L (ref 14–54)
ANION GAP: 9 (ref 5–15)
AST: 20 U/L (ref 15–41)
Alkaline Phosphatase: 43 U/L (ref 38–126)
BILIRUBIN TOTAL: 0.8 mg/dL (ref 0.3–1.2)
BUN: 7 mg/dL (ref 6–20)
CHLORIDE: 105 mmol/L (ref 101–111)
CO2: 21 mmol/L — ABNORMAL LOW (ref 22–32)
Calcium: 8.6 mg/dL — ABNORMAL LOW (ref 8.9–10.3)
Creatinine, Ser: 0.63 mg/dL (ref 0.44–1.00)
GFR calc Af Amer: >60 mL/min (ref >60)
GFR calc non Af Amer: >60 mL/min (ref >60)
GLUCOSE: 121 mg/dL — AB (ref 65–99)
POTASSIUM: 3.9 mmol/L (ref 3.5–5.1)
SODIUM: 135 mmol/L (ref 135–145)
TOTAL PROTEIN: 6 g/dL — AB (ref 6.5–8.1)

## 2016-07-28 LAB — I-STAT BETA HCG BLOOD, ED (MC, WL, AP ONLY): I-stat hCG, quantitative: <5 m[IU]/mL (ref <5)

## 2016-07-28 LAB — LIPASE, BLOOD: LIPASE: 28 U/L (ref 11–51)

## 2016-07-28 MED ORDER — SODIUM CHLORIDE 0.9 % IV BOLUS (SEPSIS)
1000.0000 mL | Freq: Once | INTRAVENOUS | Status: AC
  Administered 2016-07-28: 1000 mL via INTRAVENOUS

## 2016-07-28 MED ORDER — ONDANSETRON HCL 4 MG/2ML IJ SOLN
4.0000 mg | Freq: Once | INTRAMUSCULAR | Status: AC
  Administered 2016-07-28: 4 mg via INTRAVENOUS
  Filled 2016-07-28: qty 2

## 2016-07-28 MED ORDER — GI COCKTAIL ~~LOC~~
30.0000 mL | Freq: Once | ORAL | Status: AC
  Administered 2016-07-28: 30 mL via ORAL
  Filled 2016-07-28: qty 30

## 2016-07-28 MED ORDER — ONDANSETRON HCL 4 MG/2ML IJ SOLN
4.0000 mg | INTRAMUSCULAR | Status: AC
  Administered 2016-07-28: 4 mg via INTRAVENOUS
  Filled 2016-07-28: qty 2

## 2016-07-28 NOTE — ED Triage Notes (Signed)
Pt reports onset last night of n/v/d with upper abd pain and mid back pain.

## 2016-07-28 NOTE — ED Notes (Signed)
The patient left and said she was tired of waiting.  The patient said, "I have diarrhea, green water coming out of me and I do not want to be here!!"  The patient said she could take her own IV out at home and is familiar with IVs.  She advised she does her infusions at home.  I advised that I could not leave the IV in and I removed the IV without incident.  The patient apologized and said it was not our fault and she was going home.  The patient walked out on her own with her husband without any issues.

## 2016-07-28 NOTE — ED Provider Notes (Signed)
Chelsey Sanford Provider Note   CSN: [id number] Arrival date & time: 07/28/16  1448     History   Chief Complaint Chief Complaint  Patient presents with  . Abdominal Pain  . Emesis  . Back Pain    HPI Chelsey Sanford is a 41 y.o. female.  Chelsey Sanford is a 41 y.o. Female with a history of delayed gastric emptying, anxiety, and IBS who presents to the emergency department complaining of nausea, vomiting and diarrhea since last night. Patient reports her symptoms began with nausea and vomiting around 7 PM yesterday. She reports multiple episodes of vomiting and diarrhea today. She denies hematemesis or hematochezia. She reports she developed pain to her right upper quadrant that radiates around into her back. No history of previous abdominal surgeries. No treatments attempted prior to arrival. Patient denies fevers, hematemesis, hematochezia, urinary symptoms, chest pain, shortness of breath or rashes.   The history is provided by the patient, medical records and the spouse. No language interpreter was used.    Past Medical History:  Diagnosis Date  . ALLERGIC RHINITIS   . ANEMIA-NOS   . Anxiety   . Delayed gastric emptying    dx at [facility] by gastric emptying study per patient  . DYSAUTONOMIA   . ENDOMETRIOSIS   . GERD (gastroesophageal reflux disease) 10/06/2011  . Headache(784.0)   . Irritable bowel syndrome   . Lumbar disc disease   . Migraine   . POTS (postural orthostatic tachycardia syndrome)     Patient Active Problem List   Diagnosis Date Noted  . 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 Dysautonomia 05/16/2015  . B12 deficiency 05/16/2015  . Superficial phlebitis 04/29/2013  . Contact dermatitis 09/03/2012  . Migraine headache   . GERD (gastroesophageal reflux disease) 10/06/2011  . Vertigo 10/05/2011  . Anxiety state 05/03/2010  . ALLERGIC RHINITIS 05/03/2010  . ANEMIA-NOS 12/26/2007  . POTS (postural orthostatic  tachycardia syndrome) 12/26/2007  . Irritable bowel syndrome 09/08/2007  . ENDOMETRIOSIS 07/21/2007    Past Surgical History:  Procedure Laterality Date  . ANKLE RECONSTRUCTION  1993  . ELBOW SURGERY Right 2015  . TONSILLECTOMY  1996  . WISDOM TOOTH EXTRACTION      OB History    No data available       Home Medications    Prior to Admission medications   Medication Sig Start Date End Date Taking? Authorizing Provider  citalopram (CELEXA) 20 MG tablet Take 1 tablet (20 mg total) by mouth daily. 07/23/16  Yes Fay Records, MD  docusate sodium (COLACE) 100 MG capsule Take 100 mg by mouth daily as needed for mild constipation.    Yes Historical Provider, MD  drospirenone-ethinyl estradiol (YASMIN,ZARAH,SYEDA) 3-0.03 MG tablet Take 1 tablet by mouth every evening.   Yes Historical Provider, MD  Lactobacillus-Inulin ([place] PO) Take 1 tablet by mouth daily.    Yes Historical Provider, MD  ondansetron (ZOFRAN) 4 MG tablet Take 1 tablet (4 mg total) by mouth every 8 (eight) hours as needed for nausea or vomiting. 09/29/14  Yes Dorothyann Peng, NP  pantoprazole (PROTONIX) 40 MG tablet TAKE 1 TABLET BY MOUTH EVERY DAY 01/16/16  Yes Binnie Rail, MD  pindolol (VISKEN) 5 MG tablet TAKE 1 TABLET(5 MG) BY MOUTH DAILY 05/30/16  Yes Fay Records, MD  pseudoephedrine (SUDAFED) 30 MG tablet Take 30 mg by mouth daily as needed for congestion. For nasal congestion   Yes Historical Provider, MD  pyridostigmine (  MESTINON) 60 MG tablet Take 1 tablet (60 mg total) by mouth 2 (two) times daily. 01/19/16  Yes Fay Records, MD  rizatriptan (MAXALT) 10 MG tablet TAKE 1 TABLET BY MOUTH AT BEDTIME AS NEEDED FOR MIGRAINE. MAY REPEAT IN 2 HOURS AS NEEDED. MAXIMUM OF 30 MG 3 TABLETS IN 24 HOURS 05/22/16  Yes Fay Records, MD  triamcinolone (NASACORT AQ) 55 MCG/ACT nasal inhaler Place 2 sprays into both nostrils daily as needed (Patient taking 2 sprays in each nare daily as need for seasonal alllergies).  For stuffiness   Yes Historical Provider, MD  Vitamin D, Ergocalciferol, (DRISDOL) 50000 units CAPS capsule Take 1 capsule (50,000 Units total) by mouth every 7 (seven) days. 07/17/16  Yes Bo Merino, MD  magnesium oxide (MAGOX 400) 400 (241.3 Mg) MG tablet Take 1 tablet (400 mg total) by mouth daily. Patient not taking: Reported on 07/28/2016 05/16/15   Binnie Rail, MD    Family History Family History  Problem Relation Age of Onset  . Thyroid disease Mother     hypothyroidism  . Migraines Mother   . Colon polyps Mother   . Skin cancer Mother   . Crohn's disease Maternal Grandmother   . COPD Neg Hx   . Colon cancer Neg Hx     Social History Social History  Substance Use Topics  . Smoking status: Former Smoker    Quit date: 04/30/1996  . Smokeless tobacco: Never Used  . Alcohol use Yes     Comment: weekly     Allergies   Adhesive [tape]; Sulfonamide derivatives; Doxycycline; Amoxicillin; Erythromycin; Latex; and Penicillins   Review of Systems Review of Systems  Constitutional: Negative for chills and fever.  HENT: Negative for congestion and sore throat.   Eyes: Negative for visual disturbance.  Respiratory: Negative for cough and shortness of breath.   Cardiovascular: Negative for chest pain.  Gastrointestinal: Positive for abdominal pain, diarrhea, nausea and vomiting. Negative for blood in stool.  Genitourinary: Negative for difficulty urinating, dysuria, frequency, hematuria and urgency.  Musculoskeletal: Positive for back pain. Negative for neck pain.  Skin: Negative for rash.  Neurological: Negative for headaches.     Physical Exam Updated Vital Signs BP 121/78   Pulse (!) 111   Temp 98.9 F (37.2 C) (Oral)   Resp 16   Ht 5\' 6"  (1.676 m)   SpO2 99%   Physical Exam  Constitutional: She appears well-developed and well-nourished. No distress.  Nontoxic appearing.  HENT:  Head: Normocephalic and atraumatic.  Mouth/Throat: Oropharynx is clear and  moist.  Mucous membranes are moist.  Eyes: Conjunctivae are normal. Pupils are equal, round, and reactive to light. Right eye exhibits no discharge. Left eye exhibits no discharge.  Neck: Neck supple.  Cardiovascular: Regular rhythm, normal heart sounds and intact distal pulses.  Exam reveals no gallop and no friction rub.   No murmur heard. HR 110  Pulmonary/Chest: Effort normal and breath sounds normal. No respiratory distress. She has no wheezes. She has no rales.  Abdominal: Soft. Bowel sounds are normal. She exhibits no distension and no mass. There is tenderness. There is no rebound and no guarding.  Abdomen is soft and bowel sounds are present. Epigastric and right upper quadrant tenderness to palpation. No peritoneal signs. No lower abdominal tenderness to palpation. No CVA or flank tenderness.  Musculoskeletal: She exhibits no edema.  Lymphadenopathy:    She has no cervical adenopathy.  Neurological: She is alert. Coordination normal.  Skin: Skin is  warm and dry. No rash noted. She is not diaphoretic. No erythema. No pallor.  Psychiatric: She has a normal mood and affect. Her behavior is normal.  Nursing note and vitals reviewed.    ED Treatments / Results  Labs (all labs ordered are listed, but only abnormal results are displayed) Labs Reviewed  COMPREHENSIVE METABOLIC PANEL - Abnormal; Notable for the following:       Result Value   CO2 21 (*)    Glucose, Bld 121 (*)    Calcium 8.6 (*)    Total Protein 6.0 (*)    Albumin 3.2 (*)    All other components within normal limits  CBC - Abnormal; Notable for the following:    MCV 76.9 (*)    MCH 25.9 (*)    All other components within normal limits  LIPASE, BLOOD  URINALYSIS, ROUTINE W REFLEX MICROSCOPIC  I-STAT BETA HCG BLOOD, ED ([facility], [facility], [facility] ONLY)    EKG  EKG Interpretation None       Radiology US Abdomen Complete  Result Date: 07/28/2016 CLINICAL DATA:  Abdominal pain.  Vomiting since last night. EXAM: ABDOMEN  ULTRASOUND COMPLETE COMPARISON:  09/29/2014 FINDINGS: Gallbladder: No gallstones or wall thickening visualized. No sonographic Murphy sign noted by sonographer. Common bile duct: Diameter: 3 mm Liver: Stable simple appearing cysts in the right lobe near the dome measuring 3.5 cm. No other liver masses or lesions. Liver normal in size. IVC: No abnormality visualized. Pancreas: Visualized portion unremarkable. Spleen: Size and appearance within normal limits. Right Kidney: Length: 11.5 cm. Echogenicity within normal limits. No mass or hydronephrosis visualized. Left Kidney: Length: 10.7 cm. Echogenicity within normal limits. No mass or hydronephrosis visualized. Abdominal aorta: No aneurysm visualized. Other findings: None. IMPRESSION: 1. No acute findings.  Normal gallbladder.  No bile duct dilation. 2. Stable liver cysts.  No other abnormalities. Electronically Signed   By: Lajean Manes M.D.   On: 07/28/2016 11:52    Procedures Procedures (including critical care time)  Medications Ordered in ED Medications  sodium chloride 0.9 % bolus 1,000 mL (0 mLs Intravenous Stopped 07/28/16 1020)  ondansetron (ZOFRAN) injection 4 mg (4 mg Intravenous Given 07/28/16 0909)  gi cocktail (Maalox,Lidocaine,Donnatal) (30 mLs Oral Given 07/28/16 1035)  sodium chloride 0.9 % bolus 1,000 mL (0 mLs Intravenous Stopped 07/28/16 1512)  ondansetron (ZOFRAN) injection 4 mg (4 mg Intravenous Given 07/28/16 1349)     Initial Impression / Assessment and Plan / ED Course  I have reviewed the triage vital signs and the nursing notes.  Pertinent labs & imaging results that were available during my care of the patient were reviewed by me and considered in my medical decision making (see chart for details).    This is a 41 y.o. Female with a history of delayed gastric emptying, anxiety, and IBS who presents to the emergency department complaining of nausea, vomiting and diarrhea since last night. Patient reports her symptoms began  with nausea and vomiting around 7 PM yesterday. She reports multiple episodes of vomiting and diarrhea today. She denies hematemesis or hematochezia. She reports she developed pain to her right upper quadrant that radiates around into her back. No history of previous abdominal surgeries.  On exam patient is afebrile nontoxic appearing. She is slightly tachycardic with a heart rate of 110.  Her abdomen is soft and she has mild epigastric and right upper quadrant abdominal tenderness to palpation. No peritoneal signs. Pregnancy test is negative. Urinalysis is without sign of infection. Lipase is  within normal limits. CMP shows normal liver enzymes. CBC is unremarkable. At reevaluation patient reports she is feeling slightly better, however still having some abdominal pain. Will obtain ultrasound for gallbladder evaluation. Will provide with GI cocktail and additional fluid bolus. Abdominal ultrasound shows no acute findings. Normal gallbladder. No bile duct dilation. At reevaluation patient is tolerating by mouth, but reports some nausea returning. She is not vomiting. She is requesting additional nausea medication. I advised him it would provide her with some and reevaluate and make sure she did not vomit. She agrees with this plan. I was called to another bedside for a trauma patient. When I return from this room nursing staff reports the patient left AMA, because she was tired of waiting for discharge. Plan was to discharge this patient. I did see the patient prior to her leaving.     Final Clinical Impressions(s) / ED Diagnoses   Final diagnoses:  Epigastric abdominal pain  RUQ abdominal pain  Nausea vomiting and diarrhea    New Prescriptions Discharge Medication List as of 07/28/2016  3:20 PM       Waynetta Pean, PA-C 07/28/16 [place], MD 07/29/16 [phone]

## 2016-07-28 NOTE — ED Notes (Signed)
Patient returned  placed back on monitor no complaints at this time.

## 2016-07-31 ENCOUNTER — Other Ambulatory Visit: Payer: BLUE CROSS/BLUE SHIELD

## 2016-07-31 ENCOUNTER — Encounter: Payer: Self-pay | Admitting: Internal Medicine

## 2016-07-31 ENCOUNTER — Telehealth: Payer: Self-pay | Admitting: Internal Medicine

## 2016-07-31 ENCOUNTER — Ambulatory Visit (INDEPENDENT_AMBULATORY_CARE_PROVIDER_SITE_OTHER): Payer: BLUE CROSS/BLUE SHIELD | Admitting: Internal Medicine

## 2016-07-31 VITALS — BP 134/90 | HR 81 | Temp 98.5°F | Ht 66.5 in | Wt 170.0 lb

## 2016-07-31 DIAGNOSIS — R1110 Vomiting, unspecified: Secondary | ICD-10-CM | POA: Insufficient documentation

## 2016-07-31 DIAGNOSIS — R112 Nausea with vomiting, unspecified: Secondary | ICD-10-CM | POA: Diagnosis not present

## 2016-07-31 DIAGNOSIS — R197 Diarrhea, unspecified: Secondary | ICD-10-CM | POA: Diagnosis not present

## 2016-07-31 DIAGNOSIS — G90A Postural orthostatic tachycardia syndrome (POTS): Secondary | ICD-10-CM

## 2016-07-31 DIAGNOSIS — R000 Tachycardia, unspecified: Secondary | ICD-10-CM

## 2016-07-31 DIAGNOSIS — I951 Orthostatic hypotension: Secondary | ICD-10-CM

## 2016-07-31 MED ORDER — DIPHENOXYLATE-ATROPINE 2.5-0.025 MG PO TABS: ORAL_TABLET | ORAL | 0 refills | Status: DC

## 2016-07-31 MED ORDER — ONDANSETRON HCL 4 MG PO TABS: 4.0000 mg | ORAL_TABLET | Freq: Three times a day (TID) | ORAL | 0 refills | Status: DC | PRN

## 2016-07-31 NOTE — Assessment & Plan Note (Signed)
afeb, exam benign, but with persistent possibly worsening symptoms this am watery non bloody stools; seems out of proportion to hx of IBS, so for stool cx, c diff check for completeness, will hold on further labs and CT, ok for lomotil prn, f/u worsening s/s

## 2016-07-31 NOTE — Progress Notes (Signed)
Pre visit review using our clinic review tool, if applicable. No additional management support is needed unless otherwise documented below in the visit note. 

## 2016-07-31 NOTE — Telephone Encounter (Signed)
Pt has an appt with Dr. Jenny Reichmann today.

## 2016-07-31 NOTE — Progress Notes (Signed)
Subjective:    Patient ID: Chelsey Sanford, female    DOB: 25-Aug-1975, 41 y.o.   MRN: [id number]  HPI  Here to f/u ED visit - c/p n/v,d since 8 Pm; Jul 27, 2016 8 Seen in ED 3/31 - "history of delayed gastric emptying, anxiety, and IBS who presents to the emergency department complaining of nausea, vomiting and diarrhea since last night. Patient reports her symptoms began with nausea and vomiting around 7 PM yesterday. She reports multiple episodes of vomiting and diarrhea today. She denies hematemesis or hematochezia. She reports she developed pain to her right upper quadrant that radiates around into her back. No history of previous abdominal surgeries. No treatments attempted prior to arrival. Patient denies fevers, hematemesis, hematochezia, urinary symptoms, chest pain, shortness of breath or rashes"  U/s neg for acute.  Labs essentially negative except slight elevation glucose.  Tx with 2 dose zofran and IVF's and did better, but then after 9 hrs she felt unattended well and took out her own IV and left,   then felt some better the next 2 days but reduced po intake. This AM had several diarrhea x 7 watery, no blood. No fever, still having some crampy pain today and since ED.  No recent antibx.  No hx of cdiff. No recent travel  No sick contacts, husband had similar food late last wk  Father is VP of [employer].   Past Medical History:  Diagnosis Date  . ALLERGIC RHINITIS   . ANEMIA-NOS   . Anxiety   . Delayed gastric emptying    dx at [facility] by gastric emptying study per patient  . DYSAUTONOMIA   . ENDOMETRIOSIS   . GERD (gastroesophageal reflux disease) 10/06/2011  . Headache(784.0)   . Irritable bowel syndrome   . Lumbar disc disease   . Migraine   . POTS (postural orthostatic tachycardia syndrome)    Past Surgical History:  Procedure Laterality Date  . ANKLE RECONSTRUCTION  1993  . ELBOW SURGERY Right 2015  . TONSILLECTOMY  1996  . WISDOM TOOTH EXTRACTION      reports that she  quit smoking about 20 years ago. She has never used smokeless tobacco. She reports that she drinks alcohol. She reports that she does not use drugs. family history includes Colon polyps in her mother; Crohn's disease in her maternal grandmother; Migraines in her mother; Skin cancer in her mother; Thyroid disease in her mother. Allergies  Allergen Reactions  . Adhesive [Tape] Anaphylaxis and Rash  . Sulfonamide Derivatives Nausea And Vomiting  . Doxycycline Nausea And Vomiting  . Amoxicillin Nausea And Vomiting and Rash  . Erythromycin Nausea And Vomiting and Rash  . Latex Rash  . Penicillins Nausea And Vomiting and Rash   Current Outpatient Prescriptions on File Prior to Visit  Medication Sig Dispense Refill  . citalopram (CELEXA) 20 MG tablet Take 1 tablet (20 mg total) by mouth daily. 90 tablet 1  . docusate sodium (COLACE) 100 MG capsule Take 100 mg by mouth daily as needed for mild constipation.     . drospirenone-ethinyl estradiol (YASMIN,ZARAH,SYEDA) 3-0.03 MG tablet Take 1 tablet by mouth every evening.    . Lactobacillus-Inulin (CULTURELLE DIGESTIVE HEALTH PO) Take 1 tablet by mouth daily.     . magnesium oxide (MAGOX 400) 400 (241.3 Mg) MG tablet Take 1 tablet (400 mg total) by mouth daily. 90 tablet 3  . pantoprazole (PROTONIX) 40 MG tablet TAKE 1 TABLET BY MOUTH EVERY DAY 90 tablet 1  .  pindolol (VISKEN) 5 MG tablet TAKE 1 TABLET(5 MG) BY MOUTH DAILY 30 tablet 11  . pseudoephedrine (SUDAFED) 30 MG tablet Take 30 mg by mouth daily as needed for congestion. For nasal congestion    . pyridostigmine (MESTINON) 60 MG tablet Take 1 tablet (60 mg total) by mouth 2 (two) times daily. 60 tablet 5  . rizatriptan (MAXALT) 10 MG tablet TAKE 1 TABLET BY MOUTH AT BEDTIME AS NEEDED FOR MIGRAINE. MAY REPEAT IN 2 HOURS AS NEEDED. MAXIMUM OF 30 MG 3 TABLETS IN 24 HOURS 12 tablet 1  . triamcinolone (NASACORT AQ) 55 MCG/ACT nasal inhaler Place 2 sprays into both nostrils daily as needed (Patient  taking 2 sprays in each nare daily as need for seasonal alllergies). For stuffiness    . Vitamin D, Ergocalciferol, (DRISDOL) 50000 units CAPS capsule Take 1 capsule (50,000 Units total) by mouth every 7 (seven) days. 12 capsule 0  . [DISCONTINUED] magnesium oxide (MAG-OX) 400 MG tablet Take 1 tablet (400 mg total) by mouth daily. 30 tablet 5   No current facility-administered medications on file prior to visit.    Review of Systems  Constitutional: Negative for other unusual diaphoresis or sweats HENT: Negative for ear discharge or swelling Eyes: Negative for other worsening visual disturbances Respiratory: Negative for stridor or other swelling  Gastrointestinal: Negative for worsening distension or other blood Genitourinary: Negative for retention or other urinary change Musculoskeletal: Negative for other MSK pain or swelling Skin: Negative for color change or other new lesions Neurological: Negative for worsening tremors and other numbness  Psychiatric/Behavioral: Negative for worsening agitation or other fatigue All other system neg per pt    Objective:   Physical Exam BP 134/90   Pulse 81   Temp 98.5 F (36.9 C) (Oral)   Ht 5' 6.5" (1.689 m)   Wt 170 lb (77.1 kg)   SpO2 98%   BMI 27.03 kg/m  VS noted, mild ill Constitutional: Pt appears in NAD HENT: Head: NCAT.  Right Ear: External ear normal.  Left Ear: External ear normal.  Eyes: . Pupils are equal, round, and reactive to light. Conjunctivae and EOM are normal Nose: without d/c or deformity Neck: Neck supple. Gross normal ROM Cardiovascular: Normal rate and regular rhythm.   Pulmonary/Chest: Effort normal and breath sounds without rales or wheezing.  Abd:  Soft, diffuse mild tender, ND, + BS, no organomegaly Neurological: Pt is alert. At baseline orientation, motor grossly intact Skin: Skin is warm. No rashes, other new lesions, no LE edema Psychiatric: Pt behavior is normal without agitation  No other exam  findings  Lab Results  Component Value Date   WBC 7.5 07/28/2016   HGB 12.8 07/28/2016   HCT 38.0 07/28/2016   PLT 239 07/28/2016   GLUCOSE 121 (H) 07/28/2016   ALT 18 07/28/2016   AST 20 07/28/2016   NA 135 07/28/2016   K 3.9 07/28/2016   CL 105 07/28/2016   CREATININE 0.63 07/28/2016   BUN 7 07/28/2016   CO2 21 (L) 07/28/2016   TSH 2.03 09/07/2015   INR 0.9 09/14/2011   ABDOMEN ULTRASOUND COMPLETE  07/28/2016   IMPRESSION: 1. No acute findings.  Normal gallbladder.  No bile duct dilation. 2. Stable liver cysts.  No other abnormalities.    Assessment & Plan:

## 2016-07-31 NOTE — Assessment & Plan Note (Signed)
Mild to mod but needs to be controlled to lessen chance of lower volume in addition of POTS and risk of fall due to orthostais; for zofran prn

## 2016-07-31 NOTE — Telephone Encounter (Signed)
PLEASE NOTE: All timestamps contained within this report are represented as [place] Standard Time. CONFIDENTIALTY NOTICE: This fax transmission is intended only for the addressee. It contains information that is legally privileged, confidential or otherwise protected from use or disclosure. If you are not the intended recipient, you are strictly prohibited from reviewing, disclosing, copying using or disseminating any of this information or taking any action in reliance on or regarding this information. If you have received this fax in error, please notify us immediately by telephone so that we can arrange for its return to [place]. Phone: [phone], Toll-Free: [phone], Fax: [phone] Page: 1 of 1 Call Id: [id number] [place] [facility] - Client [name] Patient Name: Chelsey Sanford DOB: 02-24-1976 Initial Comment caller states she has vomiting and diarrhea, has urinated Nurse Assessment Nurse: Chesley Noon, RN, Lattie Haw Date/Time (Eastern Time): 07/31/2016 10:59:50 AM Confirm and document reason for call. If symptomatic, describe symptoms. ---Caller states she has vomiting and diarrhea that started Friday night. She was seen in ED on Saturday, given fluids and zofran. Sunday she was ok. Today she has had diarrhea x 5 and severe nausea. No fever. Does the patient have any new or worsening symptoms? ---Yes Will a triage be completed? ---Yes Related visit to physician within the last 2 weeks? ---Yes Does the PT have any chronic conditions? (i.e. diabetes, asthma, etc.) ---Yes List chronic conditions. ---dysautonomia, POTS Is the patient pregnant or possibly pregnant? (Ask all females between the ages of 72-55) ---No Is this a behavioral health or substance abuse call? ---No Guidelines Guideline Title Affirmed Question Affirmed Notes Diarrhea [1] MODERATE diarrhea (e.g., 4-6 times / day more than normal) AND [2] present > 48 hours (2  days) Final Disposition User See Physician within 4 Hours (or PCP triage) Chesley Noon, RN, Lattie Haw Referrals REFERRED TO PCP OFFICE Disagree/Comply: Leta Baptist

## 2016-07-31 NOTE — Patient Instructions (Addendum)
Please take all new medication as prescribed - the zofran and lomotil as needed  Please follow the BRAT diet until you can take more solid foods  Please continue all other medications as before, and refills have been done if requested.  Please have the pharmacy call with any other refills you may need.  Please keep your appointments with your specialists as you may have planned

## 2016-08-02 ENCOUNTER — Other Ambulatory Visit: Payer: BLUE CROSS/BLUE SHIELD

## 2016-08-02 DIAGNOSIS — R112 Nausea with vomiting, unspecified: Secondary | ICD-10-CM

## 2016-08-02 DIAGNOSIS — R197 Diarrhea, unspecified: Secondary | ICD-10-CM | POA: Diagnosis not present

## 2016-08-04 LAB — CLOSTRIDIUM DIFFICILE EIA: C difficile Toxins A+B, EIA: NEGATIVE

## 2016-08-05 LAB — STOOL CULTURE

## 2016-08-06 NOTE — Assessment & Plan Note (Signed)
No recent falls but higher risk, fall precautions mentioned, cont same tx

## 2016-08-13 ENCOUNTER — Other Ambulatory Visit: Payer: Self-pay | Admitting: Internal Medicine

## 2016-08-13 DIAGNOSIS — E559 Vitamin D deficiency, unspecified: Secondary | ICD-10-CM | POA: Insufficient documentation

## 2016-08-13 NOTE — Progress Notes (Deleted)
Office Visit Note  Patient: Chelsey Sanford             Date of Birth: 1976-03-03           MRN: [id number]             PCP: Binnie Rail, MD Referring: Binnie Rail, MD Visit Date: 08/17/2016 Occupation: @GUAROCC @    Subjective:  No chief complaint on file.   History of Present Illness: Chelsey Sanford is a 41 y.o. female ***   Activities of Daily Living:  Patient reports morning stiffness for *** {minute/hour:19697}.   Patient {ACTIONS;DENIES/REPORTS:21021675::"Denies"} nocturnal pain.  Difficulty dressing/grooming: {ACTIONS;DENIES/REPORTS:21021675::"Denies"} Difficulty climbing stairs: {ACTIONS;DENIES/REPORTS:21021675::"Denies"} Difficulty getting out of chair: {ACTIONS;DENIES/REPORTS:21021675::"Denies"} Difficulty using hands for taps, buttons, cutlery, and/or writing: {ACTIONS;DENIES/REPORTS:21021675::"Denies"}   No Rheumatology ROS completed.   PMFS History:  Patient Active Problem List   Diagnosis Date Noted  . Vitamin D deficiency 08/13/2016  . Vomiting 07/31/2016  . Diarrhea 07/31/2016  . 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 Dysautonomia 05/16/2015  . B12 deficiency 05/16/2015  . Superficial phlebitis 04/29/2013  . Contact dermatitis 09/03/2012  . Migraine headache   . GERD (gastroesophageal reflux disease) 10/06/2011  . Vertigo 10/05/2011  . Anxiety state 05/03/2010  . ALLERGIC RHINITIS 05/03/2010  . ANEMIA-NOS 12/26/2007  . POTS (postural orthostatic tachycardia syndrome) 12/26/2007  . Irritable bowel syndrome 09/08/2007  . ENDOMETRIOSIS 07/21/2007    Past Medical History:  Diagnosis Date  . ALLERGIC RHINITIS   . ANEMIA-NOS   . Anxiety   . Delayed gastric emptying    dx at [facility] by gastric emptying study per patient  . DYSAUTONOMIA   . ENDOMETRIOSIS   . GERD (gastroesophageal reflux disease) 10/06/2011  . Headache(784.0)   . Irritable bowel syndrome   . Lumbar disc disease   . Migraine   . POTS (postural  orthostatic tachycardia syndrome)     Family History  Problem Relation Age of Onset  . Thyroid disease Mother     hypothyroidism  . Migraines Mother   . Colon polyps Mother   . Skin cancer Mother   . Crohn's disease Maternal Grandmother   . COPD Neg Hx   . Colon cancer Neg Hx    Past Surgical History:  Procedure Laterality Date  . ANKLE RECONSTRUCTION  1993  . ELBOW SURGERY Right 2015  . TONSILLECTOMY  1996  . 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   Physical Exam   Musculoskeletal Exam: ***  CDAI Exam: No CDAI exam completed.    Investigation: Findings:  07/13/2016 CCP 25 elevated, vitamin D 25 low, ANA negative, ENA negative, beta 2 negative, anticardiolipin negative, lupus anticoagulant negative, immunoglobulins normal, SPEP showed low albumin 07/28/2016 CBC normal, CMP calcium 8.6, total protein 6.0 low    Imaging: US Abdomen Complete  Result Date: 07/28/2016 CLINICAL DATA:  Abdominal pain.  Vomiting since last night. EXAM: ABDOMEN ULTRASOUND COMPLETE COMPARISON:  09/29/2014 FINDINGS: Gallbladder: No gallstones or wall thickening visualized. No sonographic Murphy sign noted by sonographer. Common bile duct: Diameter: 3 mm Liver: Stable simple appearing cysts in the right lobe near the  dome measuring 3.5 cm. No other liver masses or lesions. Liver normal in size. IVC: No abnormality visualized. Pancreas: Visualized portion unremarkable. Spleen: Size and appearance within normal limits. Right Kidney: Length: 11.5 cm. Echogenicity within normal limits. No mass or hydronephrosis visualized. Left Kidney: Length: 10.7 cm. Echogenicity within normal limits. No mass  or hydronephrosis visualized. Abdominal aorta: No aneurysm visualized. Other findings: None. IMPRESSION: 1. No acute findings.  Normal gallbladder.  No bile duct dilation. 2. Stable liver cysts.  No other abnormalities. Electronically Signed   By: Lajean Manes M.D.   On: 07/28/2016 11:52    Speciality Comments: No specialty comments available.    Procedures:  No procedures performed Allergies: Adhesive [tape]; Sulfonamide derivatives; Doxycycline; Amoxicillin; Erythromycin; Latex; and Penicillins   Assessment / Plan:     Visit Diagnoses: Pain in both hands - RF negative, CCP 25, ESR normal, all autoimmune workup negative  Pain in both feet  Pain of both hip joints  DDD lumbar spine  Vitamin D deficiency  POTS (postural orthostatic tachycardia syndrome)  Anxiety state  History of migraine  History of IBS   Plan ultrasound bilateral hands Orders: No orders of the defined types were placed in this encounter.  No orders of the defined types were placed in this encounter.   Face-to-face time spent with patient was *** minutes. 50% of time was spent in counseling and coordination of care.  Follow-Up Instructions: No Follow-up on file.   Bo Merino, MD  Note - This record has been created using Editor, commissioning.  Chart creation errors have been sought, but may not always  have been located. Such creation errors do not reflect on  the standard of medical care.

## 2016-08-17 ENCOUNTER — Ambulatory Visit: Payer: Self-pay | Admitting: Rheumatology

## 2016-09-04 DIAGNOSIS — M3500 Sicca syndrome, unspecified: Secondary | ICD-10-CM | POA: Insufficient documentation

## 2016-09-04 DIAGNOSIS — L568 Other specified acute skin changes due to ultraviolet radiation: Secondary | ICD-10-CM | POA: Insufficient documentation

## 2016-09-04 NOTE — Progress Notes (Deleted)
Office Visit Note  Patient: Chelsey Sanford             Date of Birth: Mar 26, 1976           MRN: [id number]             PCP: Binnie Rail, MD Referring: Binnie Rail, MD Visit Date: 09/10/2016 Occupation: @GUAROCC @    Subjective:  No chief complaint on file.   History of Present Illness: Chelsey Sanford is a 41 y.o. female ***   Activities of Daily Living:  Patient reports morning stiffness for *** {minute/hour:19697}.   Patient {ACTIONS;DENIES/REPORTS:21021675::"Denies"} nocturnal pain.  Difficulty dressing/grooming: {ACTIONS;DENIES/REPORTS:21021675::"Denies"} Difficulty climbing stairs: {ACTIONS;DENIES/REPORTS:21021675::"Denies"} Difficulty getting out of chair: {ACTIONS;DENIES/REPORTS:21021675::"Denies"} Difficulty using hands for taps, buttons, cutlery, and/or writing: {ACTIONS;DENIES/REPORTS:21021675::"Denies"}   No Rheumatology ROS completed.   PMFS History:  Patient Active Problem List   Diagnosis Date Noted  . 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 Photosensitivity 09/04/2016  . Vitamin D deficiency 08/13/2016  . Vomiting 07/31/2016  . Diarrhea 07/31/2016  . 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 Dysautonomia 05/16/2015  . B12 deficiency 05/16/2015  . Superficial phlebitis 04/29/2013  . Contact dermatitis 09/03/2012  . Migraine headache   . GERD (gastroesophageal reflux disease) 10/06/2011  . Vertigo 10/05/2011  . Anxiety state 05/03/2010  . ALLERGIC RHINITIS 05/03/2010  . ANEMIA-NOS 12/26/2007  . POTS (postural orthostatic tachycardia syndrome) 12/26/2007  . Irritable bowel syndrome 09/08/2007  . ENDOMETRIOSIS 07/21/2007    Past Medical History:  Diagnosis Date  . ALLERGIC RHINITIS   . ANEMIA-NOS   . Anxiety   . Delayed gastric emptying    dx at [facility] by gastric emptying study per patient  . DYSAUTONOMIA   . ENDOMETRIOSIS   . GERD (gastroesophageal reflux disease) 10/06/2011  . Headache(784.0)   . Irritable bowel syndrome    . Lumbar disc disease   . Migraine   . POTS (postural orthostatic tachycardia syndrome)     Family History  Problem Relation Age of Onset  . Thyroid disease Mother        hypothyroidism  . Migraines Mother   . Colon polyps Mother   . Skin cancer Mother   . Crohn's disease Maternal Grandmother   . COPD Neg Hx   . Colon cancer Neg Hx    Past Surgical History:  Procedure Laterality Date  . ANKLE RECONSTRUCTION  1993  . ELBOW SURGERY Right 2015  . TONSILLECTOMY  1996  . 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   Physical Exam   Musculoskeletal Exam: ***  CDAI Exam: No CDAI exam completed.    Investigation: Findings:  09/08/2015 ANA negative, rheumatoid factor negative, ESR 4, TSH normal, CMP normal, myoglobin normal 10/03/2015 ESR 1 CK 136 myoglobin normal CMP normal TSH normal, rheumatid factor negative 05/11/2016 CK 81 ESR 2, CBC normal, BMP normal 07/13/16 CCP 25, Vitamin D 25, SPEP Evaluation reveals an isolated decrease in albumin. This pattern is suggestive of decreased protein synthesis or protein loss.   Antinuclear Antib (ANA), ENA, Beta 2, Anticardiolipin, Lupus Anticoagulant panel, and IgG, IgA, IgM normal  Imaging: No results found.  Speciality Comments: No specialty comments available.    Procedures:  No procedures performed Allergies: Adhesive [tape]; Sulfonamide derivatives; Doxycycline; Amoxicillin; Erythromycin; Latex; and Penicillins   Assessment / Plan:     Visit Diagnoses: Pain in both hands  Pain in both feet  Vitamin D deficiency  Spondylosis of lumbar region without myelopathy or radiculopathy  History of  gastroesophageal reflux (GERD)  History of IBS  B12 deficiency  Dysautonomia  POTS (postural orthostatic tachycardia syndrome)  History of migraine  Sicca syndrome  Photosensitivity    Orders: No orders of the defined types were placed in this encounter.  No orders of the defined types were placed in this encounter.   Face-to-face time spent with patient was *** minutes. 50% of time was spent in counseling and coordination of care.  Follow-Up Instructions: No Follow-up on file.   Bo Merino, MD  Note - This record has been created using Editor, commissioning.  Chart creation errors have been sought, but may not always  have been located. Such creation errors do not reflect on  the standard of medical care.

## 2016-09-10 ENCOUNTER — Ambulatory Visit: Payer: Self-pay | Admitting: Rheumatology

## 2016-09-12 ENCOUNTER — Ambulatory Visit: Payer: BLUE CROSS/BLUE SHIELD | Admitting: Rheumatology

## 2016-09-15 ENCOUNTER — Encounter (HOSPITAL_BASED_OUTPATIENT_CLINIC_OR_DEPARTMENT_OTHER): Payer: Self-pay | Admitting: Emergency Medicine

## 2016-09-15 ENCOUNTER — Emergency Department (HOSPITAL_BASED_OUTPATIENT_CLINIC_OR_DEPARTMENT_OTHER)
Admission: EM | Admit: 2016-09-15 | Discharge: 2016-09-15 | Disposition: A | Discharge status: Home / Self Care | Payer: BLUE CROSS/BLUE SHIELD | Attending: Emergency Medicine | Admitting: Emergency Medicine

## 2016-09-15 DIAGNOSIS — Z87891 Personal history of nicotine dependence: Secondary | ICD-10-CM | POA: Diagnosis not present

## 2016-09-15 DIAGNOSIS — G43109 Migraine with aura, not intractable, without status migrainosus: Secondary | ICD-10-CM | POA: Insufficient documentation

## 2016-09-15 DIAGNOSIS — Z793 Long term (current) use of hormonal contraceptives: Secondary | ICD-10-CM | POA: Insufficient documentation

## 2016-09-15 DIAGNOSIS — R51 Headache: Secondary | ICD-10-CM | POA: Diagnosis present

## 2016-09-15 DIAGNOSIS — Z91040 Latex allergy status: Secondary | ICD-10-CM | POA: Diagnosis not present

## 2016-09-15 MED ORDER — DEXAMETHASONE SODIUM PHOSPHATE 10 MG/ML IJ SOLN
10.0000 mg | Freq: Once | INTRAMUSCULAR | Status: AC
  Administered 2016-09-15: 10 mg via INTRAVENOUS
  Filled 2016-09-15: qty 1

## 2016-09-15 MED ORDER — KETOROLAC TROMETHAMINE 15 MG/ML IJ SOLN
15.0000 mg | Freq: Once | INTRAMUSCULAR | Status: AC
  Administered 2016-09-15: 15 mg via INTRAVENOUS
  Filled 2016-09-15: qty 1

## 2016-09-15 MED ORDER — PROCHLORPERAZINE EDISYLATE 5 MG/ML IJ SOLN
10.0000 mg | Freq: Once | INTRAMUSCULAR | Status: AC
  Administered 2016-09-15: 10 mg via INTRAVENOUS
  Filled 2016-09-15: qty 2

## 2016-09-15 MED ORDER — DIPHENHYDRAMINE HCL 50 MG/ML IJ SOLN
25.0000 mg | Freq: Once | INTRAMUSCULAR | Status: AC
  Administered 2016-09-15: 25 mg via INTRAVENOUS
  Filled 2016-09-15: qty 1

## 2016-09-15 NOTE — ED Notes (Signed)
Given ginger ale 

## 2016-09-15 NOTE — ED Triage Notes (Signed)
Headache since 10 this morning with bilateral vision loss that comes and goes. Also has hx of POTS and states she thinks that may be causing symptoms. Pt also reports N/V

## 2016-09-15 NOTE — ED Provider Notes (Signed)
[place] DEPT [facility] Provider Note   CSN: [id number] Arrival date & time: 09/15/16  1314     History   Chief Complaint Chief Complaint  Patient presents with  . Headache    HPI Chelsey Sanford is a 41 y.o. female.  The history is provided by the patient.  Migraine  This is a recurrent problem. The current episode started 6 to 12 hours ago. The problem occurs constantly. The problem has not changed since onset.Pertinent negatives include no chest pain, no abdominal pain and no shortness of breath. Exacerbated by: sound, light. Nothing relieves the symptoms. Treatments tried: maxalt. The treatment provided no relief.   Pt is also endorsing visual aura where her vision is lost in the central portion with waving in the periphery. She reports that this occurs intermittently and usually resolves in 30-60 mins, but this time it has persisted.  Past Medical History:  Diagnosis Date  . ALLERGIC RHINITIS   . ANEMIA-NOS   . Anxiety   . Delayed gastric emptying    dx at [facility] by gastric emptying study per patient  . DYSAUTONOMIA   . ENDOMETRIOSIS   . GERD (gastroesophageal reflux disease) 10/06/2011  . Headache(784.0)   . Irritable bowel syndrome   . Lumbar disc disease   . Migraine   . POTS (postural orthostatic tachycardia syndrome)     Patient Active Problem List   Diagnosis Date Noted  . 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 Photosensitivity 09/04/2016  . Vitamin D deficiency 08/13/2016  . Vomiting 07/31/2016  . Diarrhea 07/31/2016  . 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 Dysautonomia 05/16/2015  . B12 deficiency 05/16/2015  . Superficial phlebitis 04/29/2013  . Contact dermatitis 09/03/2012  . Migraine headache   . GERD (gastroesophageal reflux disease) 10/06/2011  . Vertigo 10/05/2011  . Anxiety state 05/03/2010  . ALLERGIC RHINITIS 05/03/2010  . ANEMIA-NOS 12/26/2007  . POTS (postural orthostatic tachycardia syndrome) 12/26/2007  .  Irritable bowel syndrome 09/08/2007  . ENDOMETRIOSIS 07/21/2007    Past Surgical History:  Procedure Laterality Date  . ANKLE RECONSTRUCTION  1993  . ELBOW SURGERY Right 2015  . TONSILLECTOMY  1996  . WISDOM TOOTH EXTRACTION      OB History    No data available       Home Medications    Prior to Admission medications   Medication Sig Start Date End Date Taking? Authorizing Provider  citalopram (CELEXA) 20 MG tablet Take 1 tablet (20 mg total) by mouth daily. 07/23/16   Fay Records, MD  diphenoxylate-atropine (LOMOTIL) 2.5-0.025 MG tablet 1 tab by mouth four times daily as needed for diarrhea 07/31/16   Biagio Borg, MD  docusate sodium (COLACE) 100 MG capsule Take 100 mg by mouth daily as needed for mild constipation.     [provider]  drospirenone-ethinyl estradiol (YASMIN,ZARAH,SYEDA) 3-0.03 MG tablet Take 1 tablet by mouth every evening.    [provider]  Lactobacillus-Inulin ([place] PO) Take 1 tablet by mouth daily.     [provider]  magnesium oxide (MAGOX 400) 400 (241.3 Mg) MG tablet Take 1 tablet (400 mg total) by mouth daily. 05/16/15   Binnie Rail, MD  ondansetron (ZOFRAN) 4 MG tablet Take 1 tablet (4 mg total) by mouth every 8 (eight) hours as needed for nausea or vomiting. 07/31/16   Biagio Borg, MD  pantoprazole (PROTONIX) 40 MG tablet Take 1 tablet (40 mg total) by mouth daily. --- Office visit  needed for further refills 08/13/16   Binnie Rail, MD  pindolol (VISKEN) 5 MG tablet TAKE 1 TABLET(5 MG) BY MOUTH DAILY 05/30/16   Fay Records, MD  pseudoephedrine (SUDAFED) 30 MG tablet Take 30 mg by mouth daily as needed for congestion. For nasal congestion    [provider]  pyridostigmine (MESTINON) 60 MG tablet Take 1 tablet (60 mg total) by mouth 2 (two) times daily. 01/19/16   Fay Records, MD  rizatriptan (MAXALT) 10 MG tablet TAKE 1 TABLET BY MOUTH AT BEDTIME AS NEEDED FOR MIGRAINE. MAY REPEAT IN 2  HOURS AS NEEDED. MAXIMUM OF 30 MG 3 TABLETS IN 24 HOURS 05/22/16   Fay Records, MD  triamcinolone (NASACORT AQ) 55 MCG/ACT nasal inhaler Place 2 sprays into both nostrils daily as needed (Patient taking 2 sprays in each nare daily as need for seasonal alllergies). For stuffiness    [provider]  Vitamin D, Ergocalciferol, (DRISDOL) 50000 units CAPS capsule Take 1 capsule (50,000 Units total) by mouth every 7 (seven) days. 07/17/16   Bo Merino, MD    Family History Family History  Problem Relation Age of Onset  . Thyroid disease Mother        hypothyroidism  . Migraines Mother   . Colon polyps Mother   . Skin cancer Mother   . Crohn's disease Maternal Grandmother   . COPD Neg Hx   . Colon cancer Neg Hx     Social History Social History  Substance Use Topics  . Smoking status: Former Smoker    Quit date: 04/30/1996  . Smokeless tobacco: Never Used  . Alcohol use Yes     Comment: weekly     Allergies   Adhesive [tape]; Sulfonamide derivatives; Doxycycline; Amoxicillin; Erythromycin; Latex; and Penicillins   Review of Systems Review of Systems  Respiratory: Negative for shortness of breath.   Cardiovascular: Negative for chest pain.  Gastrointestinal: Negative for abdominal pain.   All other systems are reviewed and are negative for acute change except as noted in the HPI   Physical Exam Updated Vital Signs BP (!) 155/104 (BP Location: Right Arm)   Pulse 79   Temp 98.4 F (36.9 C) (Oral)   Resp 20   Ht 5\' 6"  (1.676 m)   Wt 160 lb (72.6 kg)   LMP 09/03/2016   SpO2 100%   BMI 25.82 kg/m   Physical Exam  Constitutional: She is oriented to person, place, and time. She appears well-developed and well-nourished. No distress.  HENT:  Head: Normocephalic and atraumatic.  Nose: Nose normal.  Eyes: Conjunctivae and EOM are normal. Pupils are equal, round, and reactive to light. Right eye exhibits no discharge. Left eye exhibits no discharge. No scleral  icterus.  Fundoscopic exam:      The right eye shows no papilledema.       The left eye shows no papilledema.  Neck: Normal range of motion. Neck supple.  Cardiovascular: Normal rate and regular rhythm.  Exam reveals no gallop and no friction rub.   No murmur heard. Pulmonary/Chest: Effort normal and breath sounds normal. No stridor. No respiratory distress. She has no rales.  Abdominal: Soft. She exhibits no distension. There is no tenderness.  Musculoskeletal: She exhibits no edema or tenderness.  Neurological: She is alert and oriented to person, place, and time.  Mental Status: Alert and oriented to person, place, and time. Attention and concentration normal. Speech clear. Recent memory is intact  Cranial Nerves  II  Visual Fields: Intact to confrontation. Visual fields intact. III, IV, VI: Pupils equal and reactive to light and near. Full eye movement without nystagmus  V Facial Sensation: Normal. No weakness of masticatory muscles  VII: No facial weakness or asymmetry  VIII Auditory Acuity: Grossly normal  IX/X: The uvula is midline; the palate elevates symmetrically  XI: Normal sternocleidomastoid and trapezius strength  XII: The tongue is midline. No atrophy or fasciculations.   Motor System: Muscle Strength: 5/5 and symmetric in the upper and lower extremities. No pronation or drift.  Muscle Tone: Tone and muscle bulk are normal in the upper and lower extremities.   Reflexes: DTRs: 2+ and symmetrical in all four extremities. Plantar responses are flexor bilaterally.  Coordination: Intact finger-to-nose, heel-to-shin, and rapid alternating movements. No tremor.  Sensation: Intact to light touch.  Gait: Routine gait normal    Skin: Skin is warm and dry. No rash noted. She is not diaphoretic. No erythema.  Psychiatric: She has a normal mood and affect.  Vitals reviewed.    ED Treatments / Results  Labs (all labs ordered are listed, but only abnormal results are  displayed) Labs Reviewed - No data to display  EKG  EKG Interpretation None       Radiology No results found.  Procedures Procedures (including critical care time)  Medications Ordered in ED Medications  diphenhydrAMINE (BENADRYL) injection 25 mg (25 mg Intravenous Given 09/15/16 1406)  dexamethasone (DECADRON) injection 10 mg (10 mg Intravenous Given 09/15/16 1406)  prochlorperazine (COMPAZINE) injection 10 mg (10 mg Intravenous Given 09/15/16 1406)  ketorolac (TORADOL) 15 MG/ML injection 15 mg (15 mg Intravenous Given 09/15/16 1405)     Initial Impression / Assessment and Plan / ED Course  I have reviewed the triage vital signs and the nursing notes.  Pertinent labs & imaging results that were available during my care of the patient were reviewed by me and considered in my medical decision making (see chart for details).     Typical migraine headache for the pt. Non focal neuro exam. No recent head trauma. No fever. Doubt meningitis. Doubt intracranial bleed. Doubt IIH. No indication for imaging. Will treat with migraine cocktail and reevaluate.    Final Clinical Impressions(s) / ED Diagnoses   Final diagnoses:  Migraine with aura and without status migrainosus, not intractable   Disposition: Discharge  Condition: Good  I have discussed the results, Dx and Tx plan with the patient who expressed understanding and agree(s) with the plan. Discharge instructions discussed at great length. The patient was given strict return precautions who verbalized understanding of the instructions. No further questions at time of discharge.    Discharge Medication List as of 09/15/2016  5:02 PM      Follow Up: Binnie Rail, MD [place] [address] [phone]  Schedule an appointment as soon as possible for a visit        Leonette Monarch Grayce Sessions, MD 09/15/16 2022

## 2016-09-18 ENCOUNTER — Encounter: Payer: Self-pay | Admitting: Neurology

## 2016-09-19 ENCOUNTER — Encounter: Payer: Self-pay | Admitting: Diagnostic Neuroimaging

## 2016-09-19 ENCOUNTER — Ambulatory Visit (INDEPENDENT_AMBULATORY_CARE_PROVIDER_SITE_OTHER): Payer: BLUE CROSS/BLUE SHIELD | Admitting: Diagnostic Neuroimaging

## 2016-09-19 VITALS — BP 128/85 | HR 81 | Ht 66.0 in | Wt 166.6 lb

## 2016-09-19 DIAGNOSIS — R519 Headache, unspecified: Secondary | ICD-10-CM

## 2016-09-19 DIAGNOSIS — R51 Headache: Secondary | ICD-10-CM

## 2016-09-19 DIAGNOSIS — H539 Unspecified visual disturbance: Secondary | ICD-10-CM | POA: Diagnosis not present

## 2016-09-19 MED ORDER — RIZATRIPTAN BENZOATE 10 MG PO TBDP: 10.0000 mg | ORAL_TABLET | ORAL | 11 refills | Status: DC | PRN

## 2016-09-19 MED ORDER — TOPIRAMATE 25 MG PO TABS: 25.0000 mg | ORAL_TABLET | Freq: Two times a day (BID) | ORAL | 6 refills | Status: DC

## 2016-09-19 NOTE — Progress Notes (Signed)
[facility]  PATIENT: Chelsey Sanford DOB: [birth year]  REFERRING CLINICIAN: Dorris Carnes HISTORY FROM: patient  REASON FOR VISIT: new consult    HISTORICAL  CHIEF COMPLAINT:  Chief Complaint  Patient presents with  . Headache    rm 6, New Pt, ED referral, "temporary vision loss and disturbances, headaches 1-2 x week; hx migraines since age 41, take Maxalt every 3 weeks before menses;"    HISTORY OF PRESENT ILLNESS:   41 year old female with history of dysautonomia, POTS, migraine with aura, here for evaluation of visual disturbance.  Patient has history of migraine headaches since sixth grade. She describes intermittent severe throbbing headaches with nausea, vomiting, sensitivity to light, neck pain, occurring 3 times per month. Triggering factors include menstrual cycle. In addition patient has one or 2 episodes of "ocular migraine" phenomenon or she develops a black spot interrupting her visual field surrounded by a shining pattern. This may happen once or twice a year. This is associated with nausea but not headache.  On 09/15/16 patient had similar visual disturbance and aura with nausea. However within a few hours this occurred a second time at this time associated with headache. Due to change in her typical symptoms patient presents to the emergency room for evaluation. Patient was diagnosed with migraine with aura and advised to follow-up with primary care and neurology.  Since that time patient continues to have daily headaches, either right or left temporal, associated with nausea. Symptoms can last hours at a time. Symptoms have been slightly improving but are persistent.  No other specific triggering or aggravating factors recently.  Patient has been on rizatriptan tablets for migraine control. She has been on Treximet in the past.    REVIEW OF SYSTEMS: Full 14 system review of systems performed and negative with exception of: Weight loss blurred vision  loss of vision joint pain allergies diarrhea constipation ringing in ears rash.   ALLERGIES: Allergies  Allergen Reactions  . Adhesive [Tape] Anaphylaxis and Rash  . Sulfonamide Derivatives Nausea And Vomiting  . Doxycycline Nausea And Vomiting  . Amoxicillin Nausea And Vomiting and Rash  . Erythromycin Nausea And Vomiting and Rash  . Latex Rash  . Penicillins Nausea And Vomiting and Rash    HOME MEDICATIONS: Outpatient Medications Prior to Visit  Medication Sig Dispense Refill  . citalopram (CELEXA) 20 MG tablet Take 1 tablet (20 mg total) by mouth daily. 90 tablet 1  . docusate sodium (COLACE) 100 MG capsule Take 100 mg by mouth daily as needed for mild constipation.     . drospirenone-ethinyl estradiol (YASMIN,ZARAH,SYEDA) 3-0.03 MG tablet Take 1 tablet by mouth every evening.    . Lactobacillus-Inulin (CULTURELLE DIGESTIVE HEALTH PO) Take 1 tablet by mouth daily.     . magnesium oxide (MAGOX 400) 400 (241.3 Mg) MG tablet Take 1 tablet (400 mg total) by mouth daily. 90 tablet 3  . ondansetron (ZOFRAN) 4 MG tablet Take 1 tablet (4 mg total) by mouth every 8 (eight) hours as needed for nausea or vomiting. 20 tablet 0  . pantoprazole (PROTONIX) 40 MG tablet Take 1 tablet (40 mg total) by mouth daily. --- Office visit needed for further refills 90 tablet 0  . pindolol (VISKEN) 5 MG tablet TAKE 1 TABLET(5 MG) BY MOUTH DAILY 30 tablet 11  . pseudoephedrine (SUDAFED) 30 MG tablet Take 30 mg by mouth daily as needed for congestion. For nasal congestion    . pyridostigmine (MESTINON) 60 MG tablet Take 1 tablet (60 mg total)  by mouth 2 (two) times daily. 60 tablet 5  . rizatriptan (MAXALT) 10 MG tablet TAKE 1 TABLET BY MOUTH AT BEDTIME AS NEEDED FOR MIGRAINE. MAY REPEAT IN 2 HOURS AS NEEDED. MAXIMUM OF 30 MG 3 TABLETS IN 24 HOURS 12 tablet 1  . triamcinolone (NASACORT AQ) 55 MCG/ACT nasal inhaler Place 2 sprays into both nostrils daily as needed (Patient taking 2 sprays in each nare daily as  need for seasonal alllergies). For stuffiness    . Vitamin D, Ergocalciferol, (DRISDOL) 50000 units CAPS capsule Take 1 capsule (50,000 Units total) by mouth every 7 (seven) days. 12 capsule 0  . diphenoxylate-atropine (LOMOTIL) 2.5-0.025 MG tablet 1 tab by mouth four times daily as needed for diarrhea 40 tablet 0   No facility-administered medications prior to visit.     PAST MEDICAL HISTORY: Past Medical History:  Diagnosis Date  . ALLERGIC RHINITIS   . ANEMIA-NOS   . Anxiety   . Delayed gastric emptying    dx at [facility] by gastric emptying study per patient  . DYSAUTONOMIA   . ENDOMETRIOSIS   . GERD (gastroesophageal reflux disease) 10/06/2011  . Headache(784.0)   . Irritable bowel syndrome   . Lumbar disc disease   . Migraine    since 6th grade  . POTS (postural orthostatic tachycardia syndrome)     PAST SURGICAL HISTORY: Past Surgical History:  Procedure Laterality Date  . ANKLE RECONSTRUCTION  1993  . ELBOW SURGERY Right 2015  . TONSILLECTOMY  1996  . WISDOM TOOTH EXTRACTION      FAMILY HISTORY: Family History  Problem Relation Age of Onset  . Thyroid disease Mother        hypothyroidism  . Migraines Mother   . Colon polyps Mother   . Skin cancer Mother        malignant melanoma  . Crohn's disease Maternal Grandmother   . Parkinson's disease Paternal Grandmother   . Arthritis/Rheumatoid Paternal Grandmother   . Cancer Paternal Grandfather        skin  . Parkinson's disease Paternal Grandfather   . COPD Neg Hx   . Colon cancer Neg Hx     SOCIAL HISTORY:  Social History   Social History  . Marital status: Married    Spouse name: [name]  . Number of children: 1  . Years of education: 54   Occupational History  . Property Management Self Employed    [employer]   Social History Main Topics  . Smoking status: Former Smoker    Quit date: 04/30/1996  . Smokeless tobacco: Never Used  . Alcohol use Yes     Comment: several times weekly    . Drug use: No  . Sexual activity: Yes    Birth control/ protection: Pill   Other Topics Concern  . 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  Visual Acuity Screening   Right eye Left eye Both eyes  Without correction: 20/40 20/30   With correction:  Patient is in no distress; well developed, nourished and groomed; neck is supple  CARDIOVASCULAR:  Examination of carotid arteries is normal; no carotid bruits  Regular rate and rhythm, no murmurs  Examination of peripheral vascular system by observation and palpation is normal  EYES:  Ophthalmoscopic exam of optic discs and posterior segments is normal; no papilledema or hemorrhages  MUSCULOSKELETAL:  Gait, strength, tone, movements noted in Neurologic exam below  NEUROLOGIC: MENTAL STATUS:  No flowsheet data found.  awake, alert, oriented to San[place], place and time  recent and remote memory intact  normal attention and concentration  language fluent, comprehension intact, naming intact,   fund of knowledge appropriate  CRANIAL NERVE:   2nd - no papilledema on fundoscopic exam  2nd, 3rd, 4th, 6th - pupils equal and reactive to light, visual fields full to confrontation, extraocular muscles intact, no nystagmus  5th - facial sensation symmetric  7th - facial strength symmetric  8th - hearing intact  9th - palate elevates symmetrically, uvula midline  11th - shoulder shrug symmetric  12th - tongue protrusion midline  MOTOR:   normal bulk and tone, full strength in the BUE,  BLE  SENSORY:   normal and symmetric to light touch, pinprick, temperature, vibration  COORDINATION:   finger-nose-finger, fine finger movements normal  REFLEXES:   deep tendon reflexes present and symmetric  GAIT/STATION:   narrow based gait; able to walk tandem; romberg is negative    DIAGNOSTIC DATA (LABS, IMAGING, TESTING) - I reviewed patient records, labs, notes, testing and imaging myself where available.  Lab Results  Component Value Date   WBC 7.5 07/28/2016   HGB 12.8 07/28/2016   HCT 38.0 07/28/2016   MCV 76.9 (L) 07/28/2016   PLT 239 07/28/2016      Component Value Date/Time   NA 135 07/28/2016 0848   NA 140 05/11/2016 1028   K 3.9 07/28/2016 0848   CL 105 07/28/2016 0848   CO2 21 (L) 07/28/2016 0848   GLUCOSE 121 (H) 07/28/2016 0848   BUN 7 07/28/2016 0848   BUN 11 05/11/2016 1028   CREATININE 0.63 07/28/2016 0848   CREATININE 0.72 10/03/2015 1627   CALCIUM 8.6 (L) 07/28/2016 0848   PROT 6.0 (L) 07/28/2016 0848   ALBUMIN 3.2 (L) 07/28/2016 0848   AST 20 07/28/2016 0848   ALT 18 07/28/2016 0848   ALKPHOS 43 07/28/2016 0848   BILITOT 0.8 07/28/2016 0848   GFRNONAA >60 07/28/2016 0848   GFRAA >60 07/28/2016 0848   No results found for: CHOL, HDL, LDLCALC, LDLDIRECT, TRIG, CHOLHDL No results found for: HGBA1C Lab Results  Component Value Date   VITAMINB12 308 05/16/2015   Lab Results  Component Value Date   TSH 2.03 09/07/2015    04/23/07 MRI brain 1. No significant abnormality in the brain.   2. Mild chronic sinusitis.    04/23/07 MRA head - Small right vertebral artery. No significant stenosis intracranially.    04/23/07 MRA neck  1. No significant carotid stenosis.   2. Dominant left vertebral artery with a hypoplastic distal right vertebral artery.      01/02/12 MRI brain [I reviewed images myself and agree with interpretation. -VRP]  - Stable and normal noncontrast MRI appearance of the brain.     ASSESSMENT AND PLAN  41  y.o. year old female here with history of migraine with aura, now with change in symptoms including increasing frequency of headache and visual disturbance. Could represent change in her migraine pattern versus other  secondary neurologic etiology (such as stroke, demyelinating, autoimmune or inflammatory causes). Will proceed with further workup and adjust migraine medications.   Ddx: migraine with aura vs other secondary cause  1. Bilateral headaches   2. Vision disturbance      PLAN: - start topiramate 25mg  at bedtime; drink plenty of water - start rizatriptan dissolving tab as needed - check MRI brain to rule out secondary causes  Orders Placed This Encounter  Procedures  . MR BRAIN W WO CONTRAST   Meds ordered this encounter  Medications  . topiramate (TOPAMAX) 25 MG tablet    Sig: Take 1 tablet (25 mg total) by mouth 2 (two) times daily.    Dispense:  60 tablet    Refill:  6  . rizatriptan (MAXALT-MLT) 10 MG disintegrating tablet    Sig: Take 1 tablet (10 mg total) by mouth as needed for migraine. May repeat in 2 hours if needed    Dispense:  9 tablet    Refill:  11   Return in about 3 months (around 12/20/2016).  I reviewed images, labs, notes, records myself. I summarized findings and reviewed with patient, for this high risk condition (vision changes and headaches) requiring high complexity decision making.     Penni Bombard, MD 1/63/8453, 64:68 AM Certified in Neurology, Neurophysiology and Neuroimaging  [facility] [address], [place], [place] [address] [phone]

## 2016-09-19 NOTE — Patient Instructions (Signed)
Thank you for coming to see [place] at [facility]. I hope we have been able to provide you high quality care today.  You may receive a patient satisfaction survey over the next few weeks. We would appreciate your feedback and comments so that we may continue to improve ourselves and the health of our patients.  - start topiramate 73m at bedtime; drink plenty of water  - start rizatriptan as needed  - check MRI brain to rule out secondary causes   ~~~~~~~~~~~~~~~~~~~~~~~~~~~~~~~~~~~~~~~~~~~~~~~~~~~~~~~~~~~~~~~~~  DR. Hawke Villalpando'S GUIDE TO HAPPY AND HEALTHY LIVING These are some of my general health and wellness recommendations. Some of them may apply to you better than others. Please use common sense as you try these suggestions and feel free to ask me any questions.   ACTIVITY/FITNESS Mental, social, emotional and physical stimulation are very important for brain and body health. Try learning a new activity (arts, music, language, sports, games).  Keep moving your body to the best of your abilities. You can do this at home, inside or outside, the park, community center, gym or anywhere you like. Consider a physical therapist or personal trainer to get started. Consider the app Sworkit. Fitness trackers such as smart-watches, smart-phones or Fitbits can help as well.   NUTRITION Eat more plants: colorful vegetables, nuts, seeds and berries.  Eat less sugar, salt, preservatives and processed foods.  Avoid toxins such as cigarettes and alcohol.  Drink water when you are thirsty. Warm water with a slice of lemon is an excellent morning drink to start the day.  Consider these websites for more information The Nutrition Source (hhttps://www.henry-hernandez.biz/ [employer] Nutrition (wWindowBlog.ch   RELAXATION Consider practicing mindfulness meditation or other relaxation techniques such as deep breathing, prayer, yoga, tai chi,  massage. See website mindful.org or the apps Headspace or Calm to help get started.   SLEEP Try to get at least 7-8+ hours sleep per day. Regular exercise and reduced caffeine will help you sleep better. Practice good sleep hygeine techniques. See website sleep.org for more information.   PLANNING Prepare estate planning, living will, healthcare POA documents. Sometimes this is best planned with the help of an attorney. Theconversationproject.org and agingwithdignity.org are excellent resources.

## 2016-09-21 ENCOUNTER — Ambulatory Visit (INDEPENDENT_AMBULATORY_CARE_PROVIDER_SITE_OTHER): Payer: Self-pay | Admitting: Nurse Practitioner

## 2016-09-21 VITALS — BP 118/82 | HR 88 | Temp 98.4°F | Wt 165.6 lb

## 2016-09-21 DIAGNOSIS — L03113 Cellulitis of right upper limb: Secondary | ICD-10-CM

## 2016-09-21 MED ORDER — LEVOFLOXACIN 500 MG PO TABS: 500.0000 mg | ORAL_TABLET | Freq: Two times a day (BID) | ORAL | 0 refills | Status: AC

## 2016-09-21 NOTE — Patient Instructions (Addendum)

## 2016-09-21 NOTE — Progress Notes (Addendum)
Subjective:    Chelsey Sanford is a 41 y.o. female who presents for evaluation of a possible skin infection located right upper extremity since 09/15/16.  Patient was seen in the ER and had an IV placed.  Patient states when the IV was placed, she noticed the pain experienced an intense shooting pain and thought it was the alcohol burning her arm.  Patient states she has noticed the pain since 5/20 and that it has worsened.  . Symptoms include erythema located to right bicep region.  Patient states the redness started on 5/24.  Patient called the ER on 5/24 about her arm and she was informed to use Advil.  Patient states the pain rates 6/10, describes the pain "tight and intense burning.  Pain worsens with touching and extension of her arm.. Patient denies chills and fever greater than 100. Precipitating event: IV placed on 5/19. Treatment to date has included OTC analgesics with minimal relief.  The following portions of the patient's history were reviewed and updated as appropriate: allergies, current medications and past medical history.  Review of Systems Constitutional: negative Respiratory: negative Cardiovascular: negative Integument/breast: positive for skin color change and warmth and pain to RUE (bicep region) Musculoskeletal:positive for pain with flexion to RUE     Objective:    BP 118/82   Pulse 88   Temp 98.4 F (36.9 C)   Wt 165 lb 9.6 oz (75.1 kg)   LMP 09/03/2016   SpO2 98%   BMI 26.73 kg/m  General appearance: alert, cooperative and no distress Head: Normocephalic, without obvious abnormality, atraumatic Lungs: clear to auscultation bilaterally Heart: regular rate and rhythm, S1, S2 normal, no murmur, click, rub or gallop Extremities: edema RUE Skin: Skin color, texture, turgor normal. No rashes or lesions or erythema - warm to touch. Bicep region with induration approximately 3cm x 3cm. Area marked to denote any progression. and no streaking  No evidence of abscess or  fluctuance.    Physical Exam  Skin:      Assessment:    Cellulitis of the Right Upper Extremity.    Plan:    Levaquin twice daily x 7 days, #14 prescribed. Cool compresses to affected area 3-4 times daily until erythema improves. Patient will return on Tuesday (5/29) for follow up of erythema.  Tylenol for pain, fever or general discomfort.  Reviewed indications for patient to go to ER (fever, increased redness, increasing pain, chills, etc.  Patient verbalized understanding.  Patient education provided.

## 2016-09-22 ENCOUNTER — Emergency Department (HOSPITAL_BASED_OUTPATIENT_CLINIC_OR_DEPARTMENT_OTHER)
Admit: 2016-09-22 | Discharge: 2016-09-22 | Disposition: A | Discharge status: Home / Self Care | Payer: BLUE CROSS/BLUE SHIELD

## 2016-09-22 ENCOUNTER — Encounter (HOSPITAL_COMMUNITY): Payer: Self-pay | Admitting: Emergency Medicine

## 2016-09-22 ENCOUNTER — Emergency Department (HOSPITAL_COMMUNITY)
Admission: EM | Admit: 2016-09-22 | Discharge: 2016-09-22 | Disposition: A | Discharge status: Home / Self Care | Payer: BLUE CROSS/BLUE SHIELD | Attending: Emergency Medicine | Admitting: Emergency Medicine

## 2016-09-22 DIAGNOSIS — I82611 Acute embolism and thrombosis of superficial veins of right upper extremity: Secondary | ICD-10-CM | POA: Insufficient documentation

## 2016-09-22 DIAGNOSIS — M79609 Pain in unspecified limb: Secondary | ICD-10-CM | POA: Diagnosis not present

## 2016-09-22 DIAGNOSIS — M7989 Other specified soft tissue disorders: Secondary | ICD-10-CM | POA: Diagnosis not present

## 2016-09-22 DIAGNOSIS — Z7901 Long term (current) use of anticoagulants: Secondary | ICD-10-CM | POA: Diagnosis not present

## 2016-09-22 DIAGNOSIS — Z87891 Personal history of nicotine dependence: Secondary | ICD-10-CM | POA: Insufficient documentation

## 2016-09-22 DIAGNOSIS — Z79899 Other long term (current) drug therapy: Secondary | ICD-10-CM | POA: Insufficient documentation

## 2016-09-22 DIAGNOSIS — M79601 Pain in right arm: Secondary | ICD-10-CM | POA: Diagnosis present

## 2016-09-22 DIAGNOSIS — Z91040 Latex allergy status: Secondary | ICD-10-CM | POA: Diagnosis not present

## 2016-09-22 DIAGNOSIS — Z7982 Long term (current) use of aspirin: Secondary | ICD-10-CM | POA: Diagnosis not present

## 2016-09-22 LAB — COMPREHENSIVE METABOLIC PANEL
ALT: 15 U/L (ref 14–54)
ANION GAP: 9 (ref 5–15)
AST: 18 U/L (ref 15–41)
Albumin: 3.6 g/dL (ref 3.5–5.0)
Alkaline Phosphatase: 50 U/L (ref 38–126)
BUN: 7 mg/dL (ref 6–20)
CO2: 23 mmol/L (ref 22–32)
Calcium: 9.1 mg/dL (ref 8.9–10.3)
Chloride: 102 mmol/L (ref 101–111)
Creatinine, Ser: 0.65 mg/dL (ref 0.44–1.00)
Glucose, Bld: 101 mg/dL — ABNORMAL HIGH (ref 65–99)
POTASSIUM: 3.8 mmol/L (ref 3.5–5.1)
Sodium: 134 mmol/L — ABNORMAL LOW (ref 135–145)
Total Bilirubin: 0.6 mg/dL (ref 0.3–1.2)
Total Protein: 6.6 g/dL (ref 6.5–8.1)

## 2016-09-22 LAB — CBC WITH DIFFERENTIAL/PLATELET
Basophils Absolute: 0 10*3/uL (ref 0.0–0.1)
Basophils Relative: 0 %
EOS ABS: 0.2 10*3/uL (ref 0.0–0.7)
EOS PCT: 2 %
HCT: 38.2 % (ref 36.0–46.0)
HEMOGLOBIN: 12.7 g/dL (ref 12.0–15.0)
LYMPHS PCT: 30 %
Lymphs Abs: 2.5 10*3/uL (ref 0.7–4.0)
MCH: 24.6 pg — AB (ref 26.0–34.0)
MCHC: 33.2 g/dL (ref 30.0–36.0)
MCV: 73.9 fL — AB (ref 78.0–100.0)
MONO ABS: 0.7 10*3/uL (ref 0.1–1.0)
Monocytes Relative: 8 %
NEUTROS PCT: 60 %
Neutro Abs: 4.8 10*3/uL (ref 1.7–7.7)
PLATELETS: 233 10*3/uL (ref 150–400)
RBC: 5.17 MIL/uL — AB (ref 3.87–5.11)
RDW: 13.8 % (ref 11.5–15.5)
WBC: 8.2 10*3/uL (ref 4.0–10.5)

## 2016-09-22 LAB — I-STAT CG4 LACTIC ACID, ED: LACTIC ACID, VENOUS: 1.37 mmol/L (ref 0.5–1.9)

## 2016-09-22 MED ORDER — ACETAMINOPHEN 500 MG PO TABS
1000.0000 mg | ORAL_TABLET | Freq: Once | ORAL | Status: AC
  Administered 2016-09-22: 1000 mg via ORAL
  Filled 2016-09-22: qty 2

## 2016-09-22 MED ORDER — RIVAROXABAN 15 MG PO TABS
15.0000 mg | ORAL_TABLET | Freq: Once | ORAL | Status: AC
  Administered 2016-09-22: 15 mg via ORAL
  Filled 2016-09-22: qty 1

## 2016-09-22 MED ORDER — RIVAROXABAN (XARELTO) VTE STARTER PACK (15 & 20 MG): ORAL_TABLET | ORAL | 0 refills | Status: DC

## 2016-09-22 NOTE — ED Notes (Signed)
Pt states she understands instructions. Home stable with steady gait. 

## 2016-09-22 NOTE — Discharge Instructions (Addendum)
It was our pleasure to provide your ER care today - we hope that you feel better.  Elevate arm to help with pain/swelling.  May try warm compresses/moist heat to sore area.  Take xarelto as prescribed (blood thinner).   On discussion with our vascular specialist, we recommend that you take this medication for 1 month - continue the medication until you see the vascular specialist in 1 month.   No contact sports, and be careful to avoid falls/trauma, when taking the blood thinning medication.  You may take acetaminophen as need for pain.   Follow up with vascular specialist in 1 months time - see referral  - call office this Tuesday AM to arrange appointment - when you call, tell them that we discussed your case with Dr Donnetta Hutching, and that he wanted to see you in follow up for your ED visit.   Return to ER if worse, new symptoms, increased swelling, spreading redness, chest pain, trouble breathing, high fevers, other concern.

## 2016-09-22 NOTE — ED Provider Notes (Signed)
Chelsey Sanford Provider Note   CSN: [id number] Arrival date & time: 09/22/16  1232     History   Chief Complaint Chief Complaint  Patient presents with  . Arm Pain  . Cellulitis    HPI Chelsey Sanford is a 41 y.o. female.  Patient reports a few days ago, she presented here to the emergency department. Had an IV placed and received a migraine cocktail. Over the last 1 day, has developed pain at the site of the IV, redness, swelling. Initially went to an urgent care Center. Concern for cellulitis. Started on oral levofloxacin. Separate progressive worsening of pain and swelling, extending proximally, so presented here. Denies any fevers or chills or infectious symptoms.   The history is provided by the patient and medical records.  Illness  This is a new problem. The current episode started yesterday. The problem occurs constantly. The problem has been gradually worsening. Pertinent negatives include no chest pain, no abdominal pain and no shortness of breath. Treatments tried: antibiotics. The treatment provided no relief.    Past Medical History:  Diagnosis Date  . ALLERGIC RHINITIS   . ANEMIA-NOS   . Anxiety   . Delayed gastric emptying    dx at [facility] by gastric emptying study per patient  . DYSAUTONOMIA   . ENDOMETRIOSIS   . GERD (gastroesophageal reflux disease) 10/06/2011  . Headache(784.0)   . Irritable bowel syndrome   . Lumbar disc disease   . Migraine    since 6th grade  . POTS (postural orthostatic tachycardia syndrome)     Patient Active Problem List   Diagnosis Date Noted  . 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 Photosensitivity 09/04/2016  . Vitamin D deficiency 08/13/2016  . Vomiting 07/31/2016  . Diarrhea 07/31/2016  . 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 Dysautonomia 05/16/2015  . B12 deficiency 05/16/2015  . Superficial phlebitis 04/29/2013  . Contact dermatitis 09/03/2012  . Migraine headache   . GERD  (gastroesophageal reflux disease) 10/06/2011  . Vertigo 10/05/2011  . Anxiety state 05/03/2010  . ALLERGIC RHINITIS 05/03/2010  . ANEMIA-NOS 12/26/2007  . POTS (postural orthostatic tachycardia syndrome) 12/26/2007  . Irritable bowel syndrome 09/08/2007  . ENDOMETRIOSIS 07/21/2007    Past Surgical History:  Procedure Laterality Date  . ANKLE RECONSTRUCTION  1993  . ELBOW SURGERY Right 2015  . TONSILLECTOMY  1996  . WISDOM TOOTH EXTRACTION      OB History    No data available       Home Medications    Prior to Admission medications   Medication Sig Start Date End Date Taking? Authorizing Provider  aspirin EC 325 MG tablet Take 650 mg by mouth every 6 (six) hours as needed (pain).   Yes [provider]  citalopram (CELEXA) 20 MG tablet Take 1 tablet (20 mg total) by mouth daily. 07/23/16  Yes Fay Records, MD  docusate sodium (COLACE) 100 MG capsule Take 100 mg by mouth daily.    Yes [provider]  drospirenone-ethinyl estradiol (YASMIN,ZARAH,SYEDA) 3-0.03 MG tablet Take 1 tablet by mouth at bedtime.    Yes [provider]  ibuprofen (ADVIL,MOTRIN) 200 MG tablet Take 400 mg by mouth 2 (two) times daily as needed for headache (pain).    Yes [provider]  Lactobacillus-Inulin (Newell PO) Take 1 tablet by mouth daily.    Yes [provider]  levofloxacin (LEVAQUIN) 500 MG tablet Take 1 tablet (500 mg total) by mouth 2 (two) times  daily. Patient taking differently: Take 500 mg by mouth 2 (two) times daily. 7 day course started 09/21/16 pm 09/21/16 09/28/16 Yes Kara Dies, NP  magnesium oxide (MAGOX 400) 400 (241.3 Mg) MG tablet Take 1 tablet (400 mg total) by mouth daily. 05/16/15  Yes Burns, Claudina Lick, MD  ondansetron (ZOFRAN) 4 MG tablet Take 1 tablet (4 mg total) by mouth every 8 (eight) hours as needed for nausea or vomiting. 07/31/16  Yes Biagio Borg, MD  pantoprazole (PROTONIX) 40 MG tablet Take 1 tablet  (40 mg total) by mouth daily. --- Office visit needed for further refills 08/13/16  Yes Burns, Claudina Lick, MD  pindolol (VISKEN) 5 MG tablet TAKE 1 TABLET(5 MG) BY MOUTH DAILY 05/30/16  Yes Fay Records, MD  pseudoephedrine (SUDAFED) 30 MG tablet Take 30 mg by mouth daily as needed (nasal congestion/ seasonal allergies).    Yes [provider]  pyridostigmine (MESTINON) 60 MG tablet Take 1 tablet (60 mg total) by mouth 2 (two) times daily. 01/19/16  Yes Fay Records, MD  rizatriptan (MAXALT-MLT) 10 MG disintegrating tablet Take 1 tablet (10 mg total) by mouth as needed for migraine. May repeat in 2 hours if needed 09/19/16  Yes Penumalli, Earlean Polka, MD  topiramate (TOPAMAX) 25 MG tablet Take 1 tablet (25 mg total) by mouth 2 (two) times daily. Patient taking differently: Take 25 mg by mouth at bedtime.  09/19/16  Yes Penumalli, Earlean Polka, MD  triamcinolone (NASACORT AQ) 55 MCG/ACT nasal inhaler Place 1 spray into both nostrils daily as needed (seasonal allergies). For stuffiness    Yes [provider]  Vitamin D, Ergocalciferol, (DRISDOL) 50000 units CAPS capsule Take 1 capsule (50,000 Units total) by mouth every 7 (seven) days. Patient taking differently: Take 50,000 Units by mouth every Monday.  07/17/16  Yes Deveshwar, Abel Presto, MD  Rivaroxaban 15 & 20 MG TBPK Take as directed on package: Start with one 15mg  tablet by mouth twice a day with food. On Day 22, switch to one 20mg  tablet once a day with food. 09/22/16   Lajean Saver, MD    Family History Family History  Problem Relation Age of Onset  . Thyroid disease Mother        hypothyroidism  . Migraines Mother   . Colon polyps Mother   . Skin cancer Mother        malignant melanoma  . Crohn's disease Maternal Grandmother   . Parkinson's disease Paternal Grandmother   . Arthritis/Rheumatoid Paternal Grandmother   . Cancer Paternal Grandfather        skin  . Parkinson's disease Paternal Grandfather   . COPD Neg Hx   . Colon  cancer Neg Hx     Social History Social History  Substance Use Topics  . Smoking status: Former Smoker    Quit date: 04/30/1996  . Smokeless tobacco: Never Used  . Alcohol use Yes     Comment: several times weekly     Allergies   Adhesive [tape]; Sulfonamide derivatives; Doxycycline; Sulfa antibiotics; Amoxicillin; Erythromycin; Latex; and Penicillins   Review of Systems Review of Systems  Constitutional: Negative for chills and fever.  Respiratory: Negative for shortness of breath.   Cardiovascular: Negative for chest pain.  Gastrointestinal: Negative for abdominal pain, nausea and vomiting.  Skin: Positive for rash.     Physical Exam Updated Vital Signs BP 128/85 (BP Location: Right Arm)   Pulse 96   Temp 98.2 F (36.8 C) (Oral)   Resp  18   Ht 5\' 6"  (1.676 m)   Wt 74.8 kg (165 lb)   LMP 09/03/2016   SpO2 100%   BMI 26.63 kg/m   Physical Exam  Constitutional: She appears well-developed and well-nourished. No distress.  HENT:  Head: Normocephalic and atraumatic.  Eyes: Conjunctivae are normal.  Neck: Neck supple.  Cardiovascular: Normal rate and regular rhythm.   No murmur heard. Pulmonary/Chest: Effort normal and breath sounds normal. No respiratory distress.  Abdominal: Soft. There is no tenderness.  Musculoskeletal: She exhibits no edema.  Right upper extremity with mild swelling to the medial aspect just  proximal to the antecubital fossa. Mild erythema. No warmth. No induration. Tenderness to palpation, extending proximally from there. Neurovascularly intact. All joints with full range of motion without issue.   Neurological: She is alert.  Skin: Skin is warm and dry.  Psychiatric: She has a normal mood and affect.  Nursing note and vitals reviewed.    ED Treatments / Results  Labs (all labs ordered are listed, but only abnormal results are displayed) Labs Reviewed  COMPREHENSIVE METABOLIC PANEL - Abnormal; Notable for the following:       Result  Value   Sodium 134 (*)    Glucose, Bld 101 (*)    All other components within normal limits  CBC WITH DIFFERENTIAL/PLATELET - Abnormal; Notable for the following:    RBC 5.17 (*)    MCV 73.9 (*)    MCH 24.6 (*)    All other components within normal limits  I-STAT CG4 LACTIC ACID, ED    EKG  EKG Interpretation None       Radiology No results found.  Procedures Procedures (including critical care time)  Medications Ordered in ED Medications  Rivaroxaban (XARELTO) tablet 15 mg (15 mg Oral Given 09/22/16 1804)  acetaminophen (TYLENOL) tablet 1,000 mg (1,000 mg Oral Given 09/22/16 1802)     Initial Impression / Assessment and Plan / ED Course  I have reviewed the triage vital signs and the nursing notes.  Pertinent labs & imaging results that were available during my care of the patient were reviewed by me and considered in my medical decision making (see chart for details).     Patient was initially started on antibiotics for concern for cellulitis. Description of symptoms and findings on exam were consistent with superficial thrombophlebitis. Neurovascularly intact. Performed an ultrasound which demonstrated concern for superficial thrombophlebitis with extension into the proximal arm. Spoke with on-call vascular surgery. After discussion with them, we'll start the patient on Xarelto and have the patient follow-up with her primary care doctor as well as vascular surgery on an outpatient basis. Labs otherwise here reassuring. She denies any chest pain or shortness of breath to indicate acute pathology such as pulmonary embolism.  Final Clinical Impressions(s) / ED Diagnoses   Final diagnoses:  Acute thrombosis of superficial veins of arm, right    New Prescriptions Discharge Medication List as of 09/22/2016  6:04 PM    START taking these medications   Details  Rivaroxaban 15 & 20 MG TBPK Take as directed on package: Start with one 15mg  tablet by mouth twice a day with  food. On Day 22, switch to one 20mg  tablet once a day with food., Print         Maryan Puls, MD 09/22/16 [id number]    Lajean Saver, MD 09/22/16 [phone]

## 2016-09-22 NOTE — ED Notes (Signed)
Patient transported to Ultrasound 

## 2016-09-22 NOTE — Progress Notes (Signed)
VASCULAR LAB PRELIMINARY  PRELIMINARY  PRELIMINARY  PRELIMINARY  Right upper extremity venous duplex completed.    Preliminary report: There is no DVT noted in the right upper extremity.  There is superficial thrombosis noted in the right cephalic vein from the [facility] extending into the upper arm near the shoulder.   Attempted to call results but provider with a level 1 1630.    Odessa Morren, RVT 09/22/2016, 4:32 PM

## 2016-09-22 NOTE — ED Triage Notes (Signed)
Pt c/o redness, pain and swelling onset Sunday. Pt had IV placed on Saturday. [facility] and was given antibiotics. Pt has had 2 doses of the antibiotic. Pt  Reports increased pain. Redness and swelling since yesterday.

## 2016-09-24 ENCOUNTER — Telehealth: Payer: Self-pay

## 2016-09-24 NOTE — Telephone Encounter (Signed)
Clld pt - LMOVM that she was receiving a follow up call from [facility] to see how she was doing.

## 2016-10-02 ENCOUNTER — Telehealth: Payer: Self-pay | Admitting: Diagnostic Neuroimaging

## 2016-10-02 NOTE — Telephone Encounter (Signed)
Called Patient to Inform: Her that her concern about begin on blood thinners and wanting to know if this will prevent her from having MRI. NO reason to stop blood thinner medication

## 2016-10-02 NOTE — Telephone Encounter (Signed)
Pt wanting a call back re: the MRI scheduled for 6-6, since filling out paperwork she has been put on blood thinners and wants to know if this will prevent her from having MRI, please call

## 2016-10-03 ENCOUNTER — Ambulatory Visit (INDEPENDENT_AMBULATORY_CARE_PROVIDER_SITE_OTHER): Payer: BLUE CROSS/BLUE SHIELD

## 2016-10-03 DIAGNOSIS — R51 Headache: Secondary | ICD-10-CM

## 2016-10-03 DIAGNOSIS — R519 Headache, unspecified: Secondary | ICD-10-CM

## 2016-10-03 DIAGNOSIS — H539 Unspecified visual disturbance: Secondary | ICD-10-CM | POA: Diagnosis not present

## 2016-10-03 MED ORDER — GADOPENTETATE DIMEGLUMINE 469.01 MG/ML IV SOLN: 15.0000 mL | Freq: Once | INTRAVENOUS | Status: DC | PRN

## 2016-10-08 ENCOUNTER — Telehealth: Payer: Self-pay | Admitting: Diagnostic Neuroimaging

## 2016-10-08 NOTE — Telephone Encounter (Signed)
LVM informing patient her MRI brain results are overall normal. Advised there is an incidental finding of some sinus inflammation, but overall normal MRI. Advised her to continue with Dr Gladstone Lighter plan of new headache medications and follow up in 3 months. Left number for any questions.

## 2016-11-01 ENCOUNTER — Other Ambulatory Visit: Payer: Self-pay | Admitting: Internal Medicine

## 2016-11-01 NOTE — Telephone Encounter (Signed)
Pt's pharmacy requesting a refill on Pyridostigmine. Would you like to refill this medication? Please advise

## 2016-11-05 ENCOUNTER — Other Ambulatory Visit: Payer: Self-pay | Admitting: Internal Medicine

## 2016-11-05 MED ORDER — PYRIDOSTIGMINE BROMIDE 60 MG PO TABS: 60.0000 mg | ORAL_TABLET | Freq: Two times a day (BID) | ORAL | 5 refills | Status: DC

## 2016-11-05 NOTE — Telephone Encounter (Signed)
Ok to refill 

## 2016-11-05 NOTE — Telephone Encounter (Signed)
Pt's medication was sent to pt's pharmacy as requested. Confirmation received.  °

## 2016-11-05 NOTE — Telephone Encounter (Signed)
Pt's pharmacy is requesting a refill on Pyridostigmine 60 mg tablet. Would you like to refill this medication? Please advise

## 2016-11-11 ENCOUNTER — Other Ambulatory Visit: Payer: Self-pay | Admitting: Internal Medicine

## 2016-12-07 ENCOUNTER — Encounter: Payer: Self-pay | Admitting: Internal Medicine

## 2016-12-07 ENCOUNTER — Other Ambulatory Visit: Payer: Self-pay | Admitting: Internal Medicine

## 2016-12-07 ENCOUNTER — Other Ambulatory Visit: Payer: Self-pay | Admitting: Rheumatology

## 2016-12-10 ENCOUNTER — Ambulatory Visit: Payer: BLUE CROSS/BLUE SHIELD | Admitting: Neurology

## 2016-12-14 ENCOUNTER — Other Ambulatory Visit (INDEPENDENT_AMBULATORY_CARE_PROVIDER_SITE_OTHER): Payer: BLUE CROSS/BLUE SHIELD

## 2016-12-14 ENCOUNTER — Encounter: Payer: BLUE CROSS/BLUE SHIELD | Admitting: Family Medicine

## 2016-12-14 ENCOUNTER — Encounter: Payer: Self-pay | Admitting: Internal Medicine

## 2016-12-14 ENCOUNTER — Ambulatory Visit (INDEPENDENT_AMBULATORY_CARE_PROVIDER_SITE_OTHER): Payer: BLUE CROSS/BLUE SHIELD | Admitting: Internal Medicine

## 2016-12-14 VITALS — BP 120/80 | HR 80 | Temp 98.1°F | Ht 66.0 in | Wt 166.0 lb

## 2016-12-14 DIAGNOSIS — I951 Orthostatic hypotension: Secondary | ICD-10-CM

## 2016-12-14 DIAGNOSIS — R000 Tachycardia, unspecified: Secondary | ICD-10-CM | POA: Diagnosis not present

## 2016-12-14 DIAGNOSIS — G90A Postural orthostatic tachycardia syndrome (POTS): Secondary | ICD-10-CM

## 2016-12-14 DIAGNOSIS — E538 Deficiency of other specified B group vitamins: Secondary | ICD-10-CM

## 2016-12-14 DIAGNOSIS — G43809 Other migraine, not intractable, without status migrainosus: Secondary | ICD-10-CM | POA: Diagnosis not present

## 2016-12-14 DIAGNOSIS — E559 Vitamin D deficiency, unspecified: Secondary | ICD-10-CM

## 2016-12-14 DIAGNOSIS — Z23 Encounter for immunization: Secondary | ICD-10-CM | POA: Diagnosis not present

## 2016-12-14 DIAGNOSIS — R5383 Other fatigue: Secondary | ICD-10-CM

## 2016-12-14 DIAGNOSIS — K219 Gastro-esophageal reflux disease without esophagitis: Secondary | ICD-10-CM

## 2016-12-14 LAB — VITAMIN B12: VITAMIN B 12: 208 pg/mL — AB (ref 211–911)

## 2016-12-14 LAB — COMPREHENSIVE METABOLIC PANEL
ALBUMIN: 3.9 g/dL (ref 3.5–5.2)
ALT: 15 U/L (ref 0–35)
AST: 15 U/L (ref 0–37)
Alkaline Phosphatase: 53 U/L (ref 39–117)
BUN: 11 mg/dL (ref 6–23)
CHLORIDE: 102 meq/L (ref 96–112)
CO2: 29 meq/L (ref 19–32)
CREATININE: 0.63 mg/dL (ref 0.40–1.20)
Calcium: 9.1 mg/dL (ref 8.4–10.5)
GFR: 110.07 mL/min (ref >60.00)
GLUCOSE: 98 mg/dL (ref 70–99)
Potassium: 4 mEq/L (ref 3.5–5.1)
SODIUM: 137 meq/L (ref 135–145)
Total Bilirubin: 0.6 mg/dL (ref 0.2–1.2)
Total Protein: 6.7 g/dL (ref 6.0–8.3)

## 2016-12-14 LAB — CBC WITH DIFFERENTIAL/PLATELET
BASOS PCT: 0.5 % (ref 0.0–3.0)
Basophils Absolute: 0 10*3/uL (ref 0.0–0.1)
EOS ABS: 0.1 10*3/uL (ref 0.0–0.7)
Eosinophils Relative: 1.8 % (ref 0.0–5.0)
HCT: 39.3 % (ref 36.0–46.0)
Hemoglobin: 13.1 g/dL (ref 12.0–15.0)
LYMPHS ABS: 2.8 10*3/uL (ref 0.7–4.0)
Lymphocytes Relative: 35.6 % (ref 12.0–46.0)
MCHC: 33.3 g/dL (ref 30.0–36.0)
MCV: 77.7 fl — ABNORMAL LOW (ref 78.0–100.0)
MONO ABS: 0.5 10*3/uL (ref 0.1–1.0)
Monocytes Relative: 6.7 % (ref 3.0–12.0)
NEUTROS ABS: 4.4 10*3/uL (ref 1.4–7.7)
Neutrophils Relative %: 55.4 % (ref 43.0–77.0)
PLATELETS: 251 10*3/uL (ref 150.0–400.0)
RBC: 5.07 Mil/uL (ref 3.87–5.11)
RDW: 15.8 % — AB (ref 11.5–15.5)
WBC: 8 10*3/uL (ref 4.0–10.5)

## 2016-12-14 LAB — TSH: TSH: 0.01 u[IU]/mL — AB (ref 0.35–4.50)

## 2016-12-14 MED ORDER — VITAMIN D 50 MCG (2000 UT) PO TABS: 2000.0000 [IU] | ORAL_TABLET | Freq: Every day | ORAL | Status: DC

## 2016-12-14 MED ORDER — PANTOPRAZOLE SODIUM 40 MG PO TBEC: 40.0000 mg | DELAYED_RELEASE_TABLET | Freq: Every day | ORAL | 3 refills | Status: DC

## 2016-12-14 NOTE — Assessment & Plan Note (Signed)
Start taking 2000 units daily

## 2016-12-14 NOTE — Assessment & Plan Note (Signed)
Overall, controlled Management per Dr Harrington Challenger

## 2016-12-14 NOTE — Assessment & Plan Note (Signed)
Has been out of medication Will refill - will need to continue long term Refilled today

## 2016-12-14 NOTE — Patient Instructions (Addendum)
  Test(s) ordered today. Your results will be released to [place] (or called to you) after review, usually within 72hours after test completion. If any changes need to be made, you will be notified at that same time.  All other Health Maintenance issues reviewed.   All recommended immunizations and age-appropriate screenings are up-to-date or discussed.  Tetanus immunization administered today.   Medications reviewed and updated.  Changes include starting vitamin d 2000 units daily.  Your prescription(s) have been submitted to your pharmacy. Please take as directed and contact our office if you believe you are having problem(s) with the medication(s).    Please followup in one year

## 2016-12-14 NOTE — Assessment & Plan Note (Signed)
Following with neurology controlled

## 2016-12-14 NOTE — Assessment & Plan Note (Addendum)
No longer doing injections, does not absorb B12 pills - injections not convenient Will check level Can restart B12 injections here or nasal spray weekly

## 2016-12-14 NOTE — Progress Notes (Signed)
Subjective:    Patient ID: Chelsey Sanford, female    DOB: Dec 20, 1975, 41 y.o.   MRN: [id number]  HPI She is here for follow up.  GERD:  She ran out of her medication and was not able to get a refill until she saw me.  When she takes her medication daily as prescribed her GERD is well controlled.     Joint pain:  She has seen a rheumatologist.  She has low vitamin d and was prescribed the high dose.  Her energy level has improved.  She is doing yoga 5-7 times a week and that has helped with her joint pain.  Her testing for her joints did not reveal any autoimmune disease - she is unsure if she needs to go back to see the rheumatologist or not.    POTS:  She follows with cardiology. She is taking all her meds as prescribed and doing everything she should.  She feels her symptoms overall are well controlled.    B12 def:  She was getting B12 injections monthly by the nurse who did her saline injections, but she is no longer getting them so she has not had B12 injections in a while.  She did not feel she absorbed oral B12.   Medications and allergies reviewed with patient and updated if appropriate.  Patient Active Problem List   Diagnosis Date Noted  . 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 Photosensitivity 09/04/2016  . Vitamin D deficiency 08/13/2016  . Vomiting 07/31/2016  . Diarrhea 07/31/2016  . 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 Dysautonomia 05/16/2015  . B12 deficiency 05/16/2015  . Superficial phlebitis 04/29/2013  . Contact dermatitis 09/03/2012  . Migraine headache   . GERD (gastroesophageal reflux disease) 10/06/2011  . Vertigo 10/05/2011  . Anxiety state 05/03/2010  . ALLERGIC RHINITIS 05/03/2010  . ANEMIA-NOS 12/26/2007  . POTS (postural orthostatic tachycardia syndrome) 12/26/2007  . Irritable bowel syndrome 09/08/2007  . ENDOMETRIOSIS 07/21/2007    Current Outpatient Prescriptions on File Prior to Visit  Medication Sig Dispense  Refill  . aspirin EC 325 MG tablet Take 650 mg by mouth every 6 (six) hours as needed (pain).    . citalopram (CELEXA) 20 MG tablet Take 1 tablet (20 mg total) by mouth daily. 90 tablet 1  . docusate sodium (COLACE) 100 MG capsule Take 100 mg by mouth daily.     . drospirenone-ethinyl estradiol (YASMIN,ZARAH,SYEDA) 3-0.03 MG tablet Take 1 tablet by mouth at bedtime.     Marland Kitchen ibuprofen (ADVIL,MOTRIN) 200 MG tablet Take 400 mg by mouth 2 (two) times daily as needed for headache (pain).     . Lactobacillus-Inulin (CULTURELLE DIGESTIVE HEALTH PO) Take 1 tablet by mouth daily.     . magnesium oxide (MAGOX 400) 400 (241.3 Mg) MG tablet Take 1 tablet (400 mg total) by mouth daily. 90 tablet 3  . ondansetron (ZOFRAN) 4 MG tablet Take 1 tablet (4 mg total) by mouth every 8 (eight) hours as needed for nausea or vomiting. 20 tablet 0  . pantoprazole (PROTONIX) 40 MG tablet Take 1 tablet (40 mg total) by mouth daily. --- Office visit needed for further refills 90 tablet 0  . pindolol (VISKEN) 5 MG tablet TAKE 1 TABLET(5 MG) BY MOUTH DAILY 30 tablet 11  . pseudoephedrine (SUDAFED) 30 MG tablet Take 30 mg by mouth daily as needed (nasal congestion/ seasonal allergies).     . pyridostigmine (MESTINON) 60 MG tablet Take 1 tablet (60 mg  total) by mouth 2 (two) times daily. 60 tablet 5  . rizatriptan (MAXALT-MLT) 10 MG disintegrating tablet Take 1 tablet (10 mg total) by mouth as needed for migraine. May repeat in 2 hours if needed 9 tablet 11  . topiramate (TOPAMAX) 25 MG tablet Take 1 tablet (25 mg total) by mouth 2 (two) times daily. (Patient taking differently: Take 25 mg by mouth at bedtime. ) 60 tablet 6  . triamcinolone (NASACORT AQ) 55 MCG/ACT nasal inhaler Place 1 spray into both nostrils daily as needed (seasonal allergies). For stuffiness     . Vitamin D, Ergocalciferol, (DRISDOL) 50000 units CAPS capsule Take 1 capsule (50,000 Units total) by mouth every 7 (seven) days. (Patient taking differently: Take  50,000 Units by mouth every Monday. ) 12 capsule 0  . Rivaroxaban 15 & 20 MG TBPK Take as directed on package: Start with one 15mg  tablet by mouth twice a day with food. On Day 22, switch to one 20mg  tablet once a day with food. (Patient not taking: Reported on 12/14/2016) 51 each 0  . [DISCONTINUED] magnesium oxide (MAG-OX) 400 MG tablet Take 1 tablet (400 mg total) by mouth daily. 30 tablet 5   Current Facility-Administered Medications on File Prior to Visit  Medication Dose Route Frequency Provider Last Rate Last Dose  . gadopentetate dimeglumine (MAGNEVIST) injection 15 mL  15 mL Intravenous Once PRN Penumalli, Earlean Polka, MD        Past Medical History:  Diagnosis Date  . ALLERGIC RHINITIS   . ANEMIA-NOS   . Anxiety   . Delayed gastric emptying    dx at [facility] by gastric emptying study per patient  . DYSAUTONOMIA   . ENDOMETRIOSIS   . GERD (gastroesophageal reflux disease) 10/06/2011  . Headache(784.0)   . Irritable bowel syndrome   . Lumbar disc disease   . Migraine    since 6th grade  . POTS (postural orthostatic tachycardia syndrome)     Past Surgical History:  Procedure Laterality Date  . ANKLE RECONSTRUCTION  1993  . ELBOW SURGERY Right 2015  . TONSILLECTOMY  1996  . WISDOM TOOTH EXTRACTION      Social History   Social History  . Marital status: Married    Spouse name: [name]  . Number of children: 1  . Years of education: 18   Occupational History  . Property Management Self Employed    [employer]   Social History Main Topics  . Smoking status: Former Smoker    Quit date: 04/30/1996  . Smokeless tobacco: Never Used  . Alcohol use Yes     Comment: several times weekly  . Drug use: No  . Sexual activity: Yes    Birth control/ protection: Pill   Other Topics Concern  . 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 Thyroid disease Mother        hypothyroidism  . Migraines Mother   . Colon polyps Mother   . Skin cancer Mother        malignant melanoma  . Crohn's disease Maternal Grandmother   . Parkinson's disease Paternal Grandmother   . Arthritis/Rheumatoid Paternal Grandmother   . Cancer Paternal Grandfather  skin  . Parkinson's disease Paternal Grandfather   . COPD Neg Hx   . Colon cancer Neg Hx     Review of Systems  Constitutional: Positive for fatigue. Negative for chills and fever.  Respiratory: Positive for shortness of breath (rare with palpitations). Negative for cough and wheezing.   Cardiovascular: Positive for palpitations. Negative for chest pain and leg swelling.  Gastrointestinal: Positive for abdominal pain (cramping), constipation and diarrhea (bowels vary).  Neurological: Positive for light-headedness and headaches.       Objective:   Vitals:   12/14/16 1004  BP: 120/80  Pulse: 80  Temp: 98.1 F (36.7 C)  SpO2: 100%   Filed Weights   12/14/16 1004  Weight: 166 lb (75.3 kg)   Body mass index is 26.79 kg/m.  Wt Readings from Last 3 Encounters:  12/14/16 166 lb (75.3 kg)  09/22/16 165 lb (74.8 kg)  09/21/16 165 lb 9.6 oz (75.1 kg)     Physical Exam Constitutional: Appears well-developed and well-nourished. No distress.  HENT:  Head: Normocephalic and atraumatic.  Neck: Neck supple. No tracheal deviation present. No thyromegaly present.  No cervical lymphadenopathy Cardiovascular: Normal rate, regular rhythm and normal heart sounds.   No murmur heard. No carotid bruit .  No edema Pulmonary/Chest: Effort normal and breath sounds normal. No respiratory distress. No has no wheezes. No rales.  Abdomen: soft, non tender, non distended Skin: Skin is warm and dry. Not diaphoretic.  Psychiatric: Normal mood and affect. Behavior is  normal.         Assessment & Plan:   tdap due - will give today   See Problem List for Assessment and Plan of chronic medical problems.

## 2016-12-14 NOTE — Assessment & Plan Note (Signed)
Non-specific May be related to POTS Will check tsh, cmp, cbc, b12

## 2016-12-15 ENCOUNTER — Encounter: Payer: Self-pay | Admitting: Internal Medicine

## 2016-12-15 ENCOUNTER — Other Ambulatory Visit: Payer: Self-pay | Admitting: Internal Medicine

## 2016-12-15 DIAGNOSIS — R7989 Other specified abnormal findings of blood chemistry: Secondary | ICD-10-CM

## 2016-12-17 ENCOUNTER — Other Ambulatory Visit: Payer: Self-pay | Admitting: Internal Medicine

## 2016-12-17 DIAGNOSIS — Z1231 Encounter for screening mammogram for malignant neoplasm of breast: Secondary | ICD-10-CM

## 2016-12-19 ENCOUNTER — Encounter: Payer: Self-pay | Admitting: Family Medicine

## 2016-12-23 MED ORDER — CYANOCOBALAMIN 500 MCG/0.1ML NA SOLN: 500.0000 ug | NASAL | 5 refills | Status: DC

## 2016-12-24 ENCOUNTER — Encounter (HOSPITAL_COMMUNITY): Payer: Self-pay | Admitting: Emergency Medicine

## 2016-12-24 ENCOUNTER — Ambulatory Visit (INDEPENDENT_AMBULATORY_CARE_PROVIDER_SITE_OTHER): Payer: BLUE CROSS/BLUE SHIELD

## 2016-12-24 ENCOUNTER — Ambulatory Visit (HOSPITAL_COMMUNITY)
Admission: EM | Admit: 2016-12-24 | Discharge: 2016-12-24 | Disposition: A | Discharge status: Home / Self Care | Payer: BLUE CROSS/BLUE SHIELD | Attending: Family Medicine | Admitting: Family Medicine

## 2016-12-24 DIAGNOSIS — S9031XA Contusion of right foot, initial encounter: Secondary | ICD-10-CM

## 2016-12-24 DIAGNOSIS — M79671 Pain in right foot: Secondary | ICD-10-CM | POA: Diagnosis not present

## 2016-12-24 DIAGNOSIS — S99921A Unspecified injury of right foot, initial encounter: Secondary | ICD-10-CM | POA: Diagnosis not present

## 2016-12-24 NOTE — ED Triage Notes (Signed)
Right foot pain started yesterday after dropping a 30 pound lamp on foot.  Pain on top of foot to include toes with weight bearing.  No bruising.  One time today felt pins and needles including great toe and toe next to this toe

## 2016-12-24 NOTE — Discharge Instructions (Signed)
Ice, elevation, limited weightbearing as much as she can. Where the curb and wrap for 2 or 3 days for compression and support.

## 2016-12-24 NOTE — ED Provider Notes (Signed)
[place]    CSN: [id number] Arrival date & time: 12/24/16  1223     History   Chief Complaint Chief Complaint  Patient presents with  . Foot Pain    HPI Chelsey Sanford is a 41 y.o. female.   41 year old female states she dropped a lamp that weighed about 30 pounds on top of her right foot last evening. She was able to get a bed and sleep and woke up this morning expecting to see loss of bruising and swelling to the foot. She turned like she was surprised to see that there was on. She is able to ambulate full weightbearing although the more she walks a more chest pain along the dorsal midfoot. She called her orthopedist. Appointment for tomorrow, received 1 but states she could not wait. She is able to stand and bear full weight to the foot but with some pain. Worse with persistent ambulation and weightbearing.      Past Medical History:  Diagnosis Date  . ALLERGIC RHINITIS   . ANEMIA-NOS   . Anxiety   . Delayed gastric emptying    dx at [facility] by gastric emptying study per patient  . DYSAUTONOMIA   . ENDOMETRIOSIS   . GERD (gastroesophageal reflux disease) 10/06/2011  . Headache(784.0)   . Irritable bowel syndrome   . Lumbar disc disease   . Migraine    since 6th grade  . POTS (postural orthostatic tachycardia syndrome)     Patient Active Problem List   Diagnosis Date Noted  . Fatigue 12/14/2016  . 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 Photosensitivity 09/04/2016  . Vitamin D deficiency 08/13/2016  . Vomiting 07/31/2016  . Diarrhea 07/31/2016  . 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 Dysautonomia 05/16/2015  . B12 deficiency 05/16/2015  . Superficial phlebitis 04/29/2013  . Contact dermatitis 09/03/2012  . Migraine headache   . GERD (gastroesophageal reflux disease) 10/06/2011  . Vertigo 10/05/2011  . Anxiety state 05/03/2010  . ALLERGIC RHINITIS 05/03/2010  . ANEMIA-NOS 12/26/2007  . POTS (postural orthostatic  tachycardia syndrome) 12/26/2007  . Irritable bowel syndrome 09/08/2007  . ENDOMETRIOSIS 07/21/2007    Past Surgical History:  Procedure Laterality Date  . ANKLE RECONSTRUCTION  1993  . ELBOW SURGERY Right 2015  . TONSILLECTOMY  1996  . WISDOM TOOTH EXTRACTION      OB History    No data available       Home Medications    Prior to Admission medications   Medication Sig Start Date End Date Taking? Authorizing Provider  aspirin EC 325 MG tablet Take 650 mg by mouth every 6 (six) hours as needed (pain).    [provider]  Cholecalciferol (VITAMIN D) 2000 units tablet Take 1 tablet (2,000 Units total) by mouth daily. 12/14/16   Binnie Rail, MD  citalopram (CELEXA) 20 MG tablet Take 1 tablet (20 mg total) by mouth daily. 07/23/16   Fay Records, MD  Cyanocobalamin 500 MCG/0.1ML SOLN Place 0.1 mLs (500 mcg total) into the nose once a week. 12/23/16   Binnie Rail, MD  docusate sodium (COLACE) 100 MG capsule Take 100 mg by mouth daily.     [provider]  drospirenone-ethinyl estradiol (YASMIN,ZARAH,SYEDA) 3-0.03 MG tablet Take 1 tablet by mouth at bedtime.     [provider]  ibuprofen (ADVIL,MOTRIN) 200 MG tablet Take 400 mg by mouth 2 (two) times daily as needed for headache (pain).     [provider]  Lactobacillus-Inulin (CULTURELLE DIGESTIVE HEALTH PO) Take 1 tablet by mouth daily.     [provider]  magnesium oxide (MAGOX 400) 400 (241.3 Mg) MG tablet Take 1 tablet (400 mg total) by mouth daily. 05/16/15   Binnie Rail, MD  ondansetron (ZOFRAN) 4 MG tablet Take 1 tablet (4 mg total) by mouth every 8 (eight) hours as needed for nausea or vomiting. 07/31/16   Biagio Borg, MD  pantoprazole (PROTONIX) 40 MG tablet Take 1 tablet (40 mg total) by mouth daily. 12/14/16   Binnie Rail, MD  pindolol (VISKEN) 5 MG tablet TAKE 1 TABLET(5 MG) BY MOUTH DAILY 05/30/16   Fay Records, MD  pseudoephedrine (SUDAFED) 30 MG tablet Take 30 mg by  mouth daily as needed (nasal congestion/ seasonal allergies).     [provider]  pyridostigmine (MESTINON) 60 MG tablet Take 1 tablet (60 mg total) by mouth 2 (two) times daily. 11/05/16   Fay Records, MD  Rivaroxaban 15 & 20 MG TBPK Take as directed on package: Start with one 15mg  tablet by mouth twice a day with food. On Day 22, switch to one 20mg  tablet once a day with food. Patient not taking: Reported on 12/14/2016 09/22/16   Lajean Saver, MD  rizatriptan (MAXALT-MLT) 10 MG disintegrating tablet Take 1 tablet (10 mg total) by mouth as needed for migraine. May repeat in 2 hours if needed 09/19/16   Penumalli, Earlean Polka, MD  topiramate (TOPAMAX) 25 MG tablet Take 1 tablet (25 mg total) by mouth 2 (two) times daily. Patient taking differently: Take 25 mg by mouth at bedtime.  09/19/16   Penumalli, Earlean Polka, MD  triamcinolone (NASACORT AQ) 55 MCG/ACT nasal inhaler Place 1 spray into both nostrils daily as needed (seasonal allergies). For stuffiness     [provider]    Family History Family History  Problem Relation Age of Onset  . Thyroid disease Mother        hypothyroidism  . Migraines Mother   . Colon polyps Mother   . Skin cancer Mother        malignant melanoma  . Crohn's disease Maternal Grandmother   . Parkinson's disease Paternal Grandmother   . Arthritis/Rheumatoid Paternal Grandmother   . Cancer Paternal Grandfather        skin  . Parkinson's disease Paternal Grandfather   . COPD Neg Hx   . Colon cancer Neg Hx     Social History Social History  Substance Use Topics  . Smoking status: Former Smoker    Quit date: 04/30/1996  . Smokeless tobacco: Never Used  . Alcohol use Yes     Comment: several times weekly     Allergies   Adhesive [tape]; Sulfonamide derivatives; Doxycycline; Sulfa antibiotics; Amoxicillin; Erythromycin; Latex; and Penicillins   Review of Systems Review of Systems  Constitutional: Negative for activity change, chills and  fever.  HENT: Negative.   Respiratory: Negative.   Musculoskeletal:       As per HPI  Skin: Negative for color change, pallor and rash.  Neurological: Negative.      Physical Exam Triage Vital Signs ED Triage Vitals  Enc Vitals Group     BP 12/24/16 1320 130/69     Pulse Rate 12/24/16 1320 68     Resp 12/24/16 1320 18     Temp 12/24/16 1320 98 F (36.7 C)     Temp Source 12/24/16 1320 Oral     SpO2 12/24/16 1320 98 %  Weight --      Height --      Head Circumference --      Peak Flow --      Pain Score 12/24/16 1318 5     Pain Loc --      Pain Edu? --      Excl. in [place]? --    No data found.   Updated Vital Signs BP 130/69 (BP Location: Right Arm)   Pulse 68   Temp 98 F (36.7 C) (Oral)   Resp 18   LMP 12/03/2016   SpO2 98%   Visual Acuity Right Eye Distance:   Left Eye Distance:   Bilateral Distance:    Right Eye Near:   Left Eye Near:    Bilateral Near:     Physical Exam  Constitutional: She is oriented to person, place, and time. She appears well-developed and well-nourished. No distress.  HENT:  Head: Normocephalic and atraumatic.  Eyes: EOM are normal.  Neck: Neck supple.  Pulmonary/Chest: Effort normal.  Musculoskeletal: She exhibits no edema or deformity.  Right foot with tenderness to the dorsal midfoot lesser to the forefoot. No swelling, no discoloration, no deformity. Patient is noticed standing from a seated position and able to climb onto the exam table without assistance or evidence of limp. Distal neurovascular motor sensory is grossly intact. Pedal pulse 2+. Normal warmth and color.  Neurological: She is alert and oriented to person, place, and time. No cranial nerve deficit.  Skin: Skin is warm and dry.  Psychiatric: She has a normal mood and affect.  Nursing note and vitals reviewed.    UC Treatments / Results  Labs (all labs ordered are listed, but only abnormal results are displayed) Labs Reviewed - No data to display  EKG   EKG Interpretation None       Radiology Dg Foot Complete Right  Result Date: 12/24/2016 CLINICAL DATA:  Dropped 50 pound lamp upon foot. Pain with ambulation. History stress fracture and RIGHT foot reconstruction. EXAM: RIGHT FOOT COMPLETE - 3+ VIEW COMPARISON:  RIGHT foot radiographs July 13, 2016 FINDINGS: There is no evidence of fracture or dislocation. Unchanged mixed density lesion calcaneus, possibly postoperative. Os peroneum. There is no evidence of arthropathy or other focal bone abnormality. Soft tissues are unremarkable. IMPRESSION: Stable examination: No acute fracture deformity or dislocation. Electronically Signed   By: Elon Alas M.D.   On: 12/24/2016 14:18    Procedures Procedures (including critical care time)  Medications Ordered in UC Medications - No data to display   Initial Impression / Assessment and Plan / UC Course  I have reviewed the triage vital signs and the nursing notes.  Pertinent labs & imaging results that were available during my care of the patient were reviewed by me and considered in my medical decision making (see chart for details).     Ice, elevation, limited weightbearing as much as she can. Where the curb and wrap for 2 or 3 days for compression and support.   Final Clinical Impressions(s) / UC Diagnoses   Final diagnoses:  Contusion of right foot, initial encounter    New Prescriptions New Prescriptions   No medications on file     Controlled Substance Prescriptions [place] Controlled Substance Registry consulted? Not Applicable   Janne Napoleon, NP 12/24/16 1433

## 2016-12-26 ENCOUNTER — Other Ambulatory Visit (INDEPENDENT_AMBULATORY_CARE_PROVIDER_SITE_OTHER): Payer: BLUE CROSS/BLUE SHIELD

## 2016-12-26 DIAGNOSIS — R946 Abnormal results of thyroid function studies: Secondary | ICD-10-CM | POA: Diagnosis not present

## 2016-12-26 DIAGNOSIS — R7989 Other specified abnormal findings of blood chemistry: Secondary | ICD-10-CM

## 2016-12-26 LAB — T3, FREE: T3 FREE: 3.8 pg/mL (ref 2.3–4.2)

## 2016-12-26 LAB — T4, FREE: FREE T4: 0.86 ng/dL (ref 0.60–1.60)

## 2016-12-27 ENCOUNTER — Other Ambulatory Visit: Payer: Self-pay | Admitting: Internal Medicine

## 2016-12-27 DIAGNOSIS — E0500 Thyrotoxicosis with diffuse goiter without thyrotoxic crisis or storm: Secondary | ICD-10-CM

## 2016-12-27 LAB — THYROID ANTIBODIES
THYROGLOBULIN AB: 1 [IU]/mL (ref <2)
Thyroperoxidase Ab SerPl-aCnc: 129 IU/mL — ABNORMAL HIGH (ref <9)

## 2016-12-31 LAB — TESTOSTERONE, FREE & TOTAL
FREE TESTOSTERONE: 0.3 pg/mL (ref 0.1–6.4)
TESTOSTERONE, TOTAL: 12 ng/dL (ref 2–45)

## 2017-01-08 ENCOUNTER — Ambulatory Visit: Payer: BLUE CROSS/BLUE SHIELD | Admitting: Diagnostic Neuroimaging

## 2017-01-23 LAB — HM MAMMOGRAPHY

## 2017-01-24 ENCOUNTER — Ambulatory Visit
Admission: RE | Admit: 2017-01-24 | Discharge: 2017-01-24 | Disposition: A | Discharge status: Home / Self Care | Payer: BLUE CROSS/BLUE SHIELD | Source: Ambulatory Visit | Attending: Internal Medicine | Admitting: Internal Medicine

## 2017-01-24 ENCOUNTER — Ambulatory Visit: Payer: BLUE CROSS/BLUE SHIELD

## 2017-01-24 DIAGNOSIS — Z86018 Personal history of other benign neoplasm: Secondary | ICD-10-CM | POA: Diagnosis not present

## 2017-01-24 DIAGNOSIS — Z23 Encounter for immunization: Secondary | ICD-10-CM | POA: Diagnosis not present

## 2017-01-24 DIAGNOSIS — D2261 Melanocytic nevi of right upper limb, including shoulder: Secondary | ICD-10-CM | POA: Diagnosis not present

## 2017-01-24 DIAGNOSIS — D225 Melanocytic nevi of trunk: Secondary | ICD-10-CM | POA: Diagnosis not present

## 2017-01-24 DIAGNOSIS — Z808 Family history of malignant neoplasm of other organs or systems: Secondary | ICD-10-CM | POA: Diagnosis not present

## 2017-01-24 DIAGNOSIS — Z1231 Encounter for screening mammogram for malignant neoplasm of breast: Secondary | ICD-10-CM | POA: Diagnosis not present

## 2017-01-28 ENCOUNTER — Encounter: Payer: Self-pay | Admitting: Internal Medicine

## 2017-02-12 ENCOUNTER — Encounter: Payer: Self-pay | Admitting: Internal Medicine

## 2017-02-18 DIAGNOSIS — Z01419 Encounter for gynecological examination (general) (routine) without abnormal findings: Secondary | ICD-10-CM | POA: Diagnosis not present

## 2017-02-18 DIAGNOSIS — Z1389 Encounter for screening for other disorder: Secondary | ICD-10-CM | POA: Diagnosis not present

## 2017-02-18 DIAGNOSIS — Z130 Encounter for screening for diseases of the blood and blood-forming organs and certain disorders involving the immune mechanism: Secondary | ICD-10-CM | POA: Diagnosis not present

## 2017-02-18 DIAGNOSIS — Z3041 Encounter for surveillance of contraceptive pills: Secondary | ICD-10-CM | POA: Diagnosis not present

## 2017-02-18 DIAGNOSIS — Z6827 Body mass index (BMI) 27.0-27.9, adult: Secondary | ICD-10-CM | POA: Diagnosis not present

## 2017-02-23 ENCOUNTER — Encounter: Payer: Self-pay | Admitting: Internal Medicine

## 2017-02-25 DIAGNOSIS — M25512 Pain in left shoulder: Secondary | ICD-10-CM | POA: Diagnosis not present

## 2017-02-27 ENCOUNTER — Encounter: Payer: Self-pay | Admitting: Internal Medicine

## 2017-03-07 ENCOUNTER — Encounter: Payer: Self-pay | Admitting: Internal Medicine

## 2017-03-07 ENCOUNTER — Ambulatory Visit: Payer: BLUE CROSS/BLUE SHIELD | Admitting: Internal Medicine

## 2017-03-07 VITALS — BP 120/72 | HR 72 | Wt 174.0 lb

## 2017-03-07 DIAGNOSIS — E0590 Thyrotoxicosis, unspecified without thyrotoxic crisis or storm: Secondary | ICD-10-CM

## 2017-03-07 DIAGNOSIS — E538 Deficiency of other specified B group vitamins: Secondary | ICD-10-CM

## 2017-03-07 DIAGNOSIS — E0500 Thyrotoxicosis with diffuse goiter without thyrotoxic crisis or storm: Secondary | ICD-10-CM | POA: Insufficient documentation

## 2017-03-07 LAB — T3, FREE: T3, Free: 3.5 pg/mL (ref 2.3–4.2)

## 2017-03-07 LAB — T4, FREE: Free T4: 0.82 ng/dL (ref 0.60–1.60)

## 2017-03-07 LAB — TSH: TSH: 0.28 u[IU]/mL — AB (ref 0.35–4.50)

## 2017-03-07 NOTE — Patient Instructions (Addendum)
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  Please stop at the lab.  Please come back for a follow-up appointment in 4 months.   Hyperthyroidism Hyperthyroidism is when the thyroid is too active (overactive). Your thyroid is a large gland that is located in your neck. The thyroid helps to control how your body uses food (metabolism). When your thyroid is overactive, it produces too much of a hormone called thyroxine. What are the causes? Causes of hyperthyroidism may include:  Graves disease. This is when your immune system attacks the thyroid gland. This is the most common cause.  Inflammation of the thyroid gland.  Tumor in the thyroid gland or somewhere else.  Excessive use of thyroid medicines, including: ? Prescription thyroid supplement. ? Herbal supplements that mimic thyroid hormones.  Solid or fluid-filled lumps within your thyroid gland (thyroid nodules).  Excessive ingestion of iodine.  What increases the risk?  Being female.  Having a family history of thyroid conditions. What are the signs or symptoms? Signs and symptoms of hyperthyroidism may include:  Nervousness.  Inability to tolerate heat.  Unexplained weight loss.  Diarrhea.  Change in the texture of hair or skin.  Heart skipping beats or making extra beats.  Rapid heart rate.  Loss of menstruation.  Shaky hands.  Fatigue.  Restlessness.  Increased appetite.  Sleep problems.  Enlarged thyroid gland or nodules.  How is this diagnosed? Diagnosis of hyperthyroidism may include:  Medical history and physical exam.  Blood tests.  Ultrasound tests.  How is this treated? Treatment may include:  Medicines to control your thyroid.  Surgery to remove your thyroid.  Radiation therapy.  Follow these instructions at home:  Take medicines only as directed by your health care provider.  Do not use any tobacco products, including cigarettes, chewing tobacco, or electronic cigarettes.  If you need help quitting, ask your health care provider.  Do not exercise or do physical activity until your health care provider approves.  Keep all follow-up appointments as directed by your health care provider. This is important. Contact a health care provider if:  Your symptoms do not get better with treatment.  You have fever.  You are taking thyroid replacement medicine and you: ? Have depression. ? Feel mentally and physically slow. ? Have weight gain. Get help right away if:  You have decreased alertness or a change in your awareness.  You have abdominal pain.  You feel dizzy.  You have a rapid heartbeat.  You have an irregular heartbeat. This information is not intended to replace advice given to you by your health care provider. Make sure you discuss any questions you have with your health care provider. Document Released: 04/16/2005 Document Revised: 09/15/2015 Document Reviewed: 09/01/2013 Elsevier Interactive Patient Education  2017 [employer].

## 2017-03-07 NOTE — Progress Notes (Addendum)
Patient ID: Chelsey Sanford, female   DOB: 1976/04/08, 41 y.o.   MRN: [id number]    HPI  Chelsey Sanford is a 41 y.o.-year-old female, referred by her PCP, Dr. 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  Pt was found to have a low TSH on her latest annual physical exam. She gets a TSH checked every year as she has FH of hypothyroidism in her mother. The rest of her TSH levels have been normal in since 2008.  I reviewed pt's thyroid tests: Lab Results  Component Value Date   TSH 0.01 (L) 12/14/2016   TSH 2.03 09/07/2015   TSH 1.84 05/16/2015   TSH 2.57 07/16/2014   TSH 2.50 06/18/2013   TSH 2.85 01/14/2007   FREET4 0.86 12/26/2016   FREET4 0.80 06/18/2013   T3FREE 3.8 12/26/2016   Component     Latest Ref Rng & Units 12/26/2016  Thyroperoxidase Ab SerPl-aCnc     <9 IU/mL 129 (H)  Thyroglobulin Ab     <2 IU/mL 1   Pt denies feeling nodules in neck, hoarseness, dysphagia/odynophagia, SOB with lying down.  She mentions: - + fatigue - + excessive sweating/heat intolerance - has this at baseline - no tremors - no anxiety - no palpitations (but on a beta blockers) - + hyperdefecation or constipation - alternating, chronic - no weight loss - + more hair loss and thinning  Pt does have a FH of thyroid ds. In Mother >> hypothyroidism. No FH of thyroid cancer. No h/o radiation tx to head or neck.  No seaweed or kelp, no recent contrast studies. No steroid use, but she had a shoulder steroid inj last week.. No herbal supplements. No Biotin use.  Pt. also has a history of POTS dx 2008 - managed by cardiology.  Was on Florinef and Mestinon >> now off the Florinef. Also, B12 def. (last B12 level still low: 208, 2 mo ago), and vitamin D deficiency - finished a 12 week high dose course. She has long-standing anemia. Also, eosinophilic esophagitis and hiatal hernia >> takes Protonix.   She does yoga and walks 1.5 mi with her dogs daily.  ROS: Constitutional: + se HPi, + poor  sleep Eyes: no blurry vision, no xerophthalmia ENT: no sore throat, no nodules palpated in throat, no dysphagia/odynophagia, no hoarseness Cardiovascular: no CP/+ SOB with exertion/no palpitations/leg swelling Respiratory: no cough/+ SOB with exertion Gastrointestinal: no N/V/+ D/+ C Musculoskeletal: + muscle/+ joint aches Skin: no rashes, + hair loss Neurological: no tremors/numbness/tingling/dizziness, + migraines Psychiatric: no depression/anxiety  Past Medical History:  Diagnosis Date  . ALLERGIC RHINITIS   . ANEMIA-NOS   . Anxiety   . Delayed gastric emptying    dx at [facility] by gastric emptying study per patient  . DYSAUTONOMIA   . ENDOMETRIOSIS   . GERD (gastroesophageal reflux disease) 10/06/2011  . Headache(784.0)   . Irritable bowel syndrome   . Lumbar disc disease   . Migraine    since 6th grade  . POTS (postural orthostatic tachycardia syndrome)    Past Surgical History:  Procedure Laterality Date  . ANKLE RECONSTRUCTION  1993  . ELBOW SURGERY Right 2015  . TONSILLECTOMY  1996  . WISDOM TOOTH EXTRACTION     Social History   Socioeconomic History  . Marital status: Married    Spouse name: [name]  . Number of children: 1  . Years of education: 80  . Highest education level: Not on file  Occupational History  . Occupation: Risk manager  Employer: Self Employed    Comment: [employer]  Tobacco Use  . Smoking status: Former Smoker    Last attempt to quit: 04/30/1996    Years since quitting: 20.8  . Smokeless tobacco: Never Used  Substance and Sexual Activity  . Alcohol use: Yes    Comment: 1 time weekly - 2 drinks of winr  . Drug use: No  . Sexual activity: Yes    Birth control/protection: Pill  Other Topics Concern  . 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 citalopram (CELEXA) 20 MG tablet Take 1 tablet (20 mg total) by mouth daily. 90 tablet 1  . docusate sodium (COLACE) 100 MG capsule Take 100 mg by mouth daily.     Marland Kitchen ibuprofen (ADVIL,MOTRIN) 200 MG tablet Take 400 mg by mouth 2 (two) times daily as needed for headache (pain).     . Lactobacillus-Inulin (CULTURELLE DIGESTIVE HEALTH PO) Take 1 tablet by mouth daily.     . magnesium oxide (MAGOX 400) 400 (241.3 Mg) MG tablet Take 1 tablet (400 mg total) by mouth daily. 90 tablet 3  . ondansetron (ZOFRAN) 4 MG tablet Take 1 tablet (4 mg total) by mouth every 8 (eight) hours as needed for nausea or vomiting. 20 tablet 0  . pantoprazole (PROTONIX) 40 MG tablet Take 1 tablet (40 mg total) by mouth daily. 90 tablet 3  . pindolol (VISKEN) 5 MG tablet TAKE 1 TABLET(5 MG) BY MOUTH DAILY 30 tablet 11  . pseudoephedrine (SUDAFED) 30 MG tablet Take 30 mg by mouth daily as needed (nasal congestion/ seasonal allergies).     . pyridostigmine (MESTINON) 60 MG tablet Take 1 tablet (60 mg total) by mouth 2 (two) times daily. 60 tablet 5  . rizatriptan (MAXALT-MLT) 10 MG disintegrating tablet Take 1 tablet (10 mg total) by mouth as needed for migraine. May repeat in 2 hours if needed 9 tablet 11  . topiramate (TOPAMAX) 25 MG tablet Take 1 tablet (25 mg total) by mouth 2 (two) times daily. (Patient taking differently: Take 25 mg by mouth at bedtime. ) 60 tablet 6  . triamcinolone (NASACORT AQ) 55 MCG/ACT nasal inhaler Place 1 spray into both nostrils daily as needed (seasonal allergies). For stuffiness     . drospirenone-ethinyl estradiol (YASMIN,ZARAH,SYEDA) 3-0.03 MG tablet Take 1 tablet by mouth at bedtime.     . Rivaroxaban 15 & 20 MG TBPK Take as directed on package: Start with one 15mg  tablet by mouth twice a day with food. On Day 22, switch to one 20mg  tablet once a day with food.  (Patient not taking: Reported on 12/14/2016) 51 each 0  . [DISCONTINUED] magnesium oxide (MAG-OX) 400 MG tablet Take 1 tablet (400 mg total) by mouth daily. 30 tablet 5   Current Facility-Administered Medications on File Prior to Visit  Medication Dose Route Frequency Provider Last Rate Last Dose  . gadopentetate dimeglumine (MAGNEVIST) injection 15 mL  15 mL Intravenous Once PRN Penumalli, Earlean Polka, MD       Allergies  Allergen Reactions  . Adhesive [Tape] Rash and Other (See Comments)    Skin blistered and disintegrated after epidural (looked like  3rd degree burn) - possibly due to tape and/or tegaderm - do not use anything with adhesive  . Sulfonamide Derivatives Nausea And Vomiting  . Doxycycline Nausea And Vomiting  . Sulfa Antibiotics Nausea And Vomiting  . Amoxicillin Nausea And Vomiting and Rash  . Erythromycin Nausea And Vomiting and Rash  . Latex Rash    See notes under adhesive tape  . Penicillins Nausea And Vomiting and Rash    Has patient had a PCN reaction causing immediate rash, facial/tongue/throat swelling, SOB or lightheadedness with hypotension: Yes Has patient had a PCN reaction causing severe rash involving mucus membranes or skin necrosis: No Has patient had a PCN reaction that required hospitalization: No Has patient had a PCN reaction occurring within the last 10 years: No If all of the above answers are "NO", then may proceed with Cephalosporin use.   Family History  Problem Relation Age of Onset  . Thyroid disease Mother        hypothyroidism  . Migraines Mother   . Colon polyps Mother   . Skin cancer Mother        malignant melanoma  . Crohn's disease Maternal Grandmother   . Parkinson's disease Paternal Grandmother   . Arthritis/Rheumatoid Paternal Grandmother   . Cancer Paternal Grandfather        skin  . Parkinson's disease Paternal Grandfather   . COPD Neg Hx   . Colon cancer Neg Hx     PE: BP 120/72 (BP Location: Left Arm, Patient Position:  Sitting)   Pulse 72   Wt 174 lb (78.9 kg)   LMP 03/05/2017   SpO2 97%   BMI 28.08 kg/m  Wt Readings from Last 3 Encounters:  03/07/17 174 lb (78.9 kg)  12/14/16 166 lb (75.3 kg)  09/22/16 165 lb (74.8 kg)   Constitutional: slightly overweight, in NAD Eyes: PERRLA, EOMI, no exophthalmos, no lid lag, no stare ENT: moist mucous membranes, no thyromegaly, no thyroid bruits, no cervical lymphadenopathy Cardiovascular: RRR, No MRG Respiratory: CTA B Gastrointestinal: abdomen soft, NT, ND, BS+ Musculoskeletal: no deformities, strength intact in all 4 Skin: moist, warm, no rashes Neurological: no tremor with outstretched hands, DTR normal in all 4  ASSESSMENT: 1. Subclinical Thyrotoxicosis  2. B12 deficiency  PLAN:  1. Patient with a recently found low TSH x1 (normal previous TSH levels, and normal free thyroid hormones at the time of last check), without thyrotoxic sxs: no weight loss, tremors, heat intolerance (she has some but chronic), palpitations (but she is on beta blocker), anxiety. She is more ired lately, but not significantly so. - she does not appear to have exogenous causes for the low TSH. She got a steroid inj 1 week ago, but not before the last set of thyroid labs. - We discussed that possible causes of thyrotoxicosis are:  Graves ds   Thyroiditis toxic multinodular goiter/ toxic adenoma (I cannot feel nodules at palpation of her thyroid). - will check the TSH, fT3 and fT4 and also add thyroid stimulating antibodies to screen for Graves' disease.  - If the tests remain abnormal, we may need an uptake and scan to differentiate between the 3 above possible etiologies  - we discussed about possible modalities of treatment for the above conditions, to include methimazole use, radioactive iodine ablation or (last resort) surgery. - we may need to do thyroid ultrasound depending on the results of the uptake and scan (if a cold nodule is present) - continue beta blocker (she is  on this for  her POTS) - RTC in 4 months, but likely sooner for repeat labs  2. B12 deficiency - she noticed more fatigue lately >> could be 2/2 low B12 (reviewed latest lab with the pt) - she is not on any B12 vitamin supplement now as she could not afford the intranasal formulation which she was using in the past (notr covered by insurance anymore) - I recommended B12 5000 mcg daily  Component     Latest Ref Rng & Units 03/07/2017  T4,Free(Direct)     0.60 - 1.60 ng/dL 0.82  TSH     0.35 - 4.50 uIU/mL 0.28 (L)  Triiodothyronine,Free,Serum     2.3 - 4.2 pg/mL 3.5  TSI     <140 % baseline 441 (H)   TFTs greatly improved. However, TSI Abs are higher than expected with thyroiditis. 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 To clarify, will need a thyroid Uptake and scan. No intervention needed for now >> will recheck TFTs at next visit.  NM THYROID SNG UPTAKE W/IMAGING  Order: [id number]  Status:  Final result Visible to patient:  No (Not Released) Dx:  Thyrotoxicosis without thyroid storm,...  Details   Reading Physician Reading Date Result Priority  Lavonia Dana, MD 04/03/2017     Narrative    CLINICAL DATA: Thyrotoxicosis without thyroid storm  EXAM: THYROID SCAN AND UPTAKE - 24 HOURS  TECHNIQUE: Following the per oral administration of I-131 sodium iodide, the patient returned at 24 hours and uptake measurements were acquired with the uptake probe centered on the neck. Thyroid imaging was performed following the intravenous administration of the Tc-52m Pertechnetate.  RADIOPHARMACEUTICALS: 7.55 MicroCuries I-131 sodium iodide orally and 10 mCi Technetium-32m pertechnetate IV  COMPARISON: None  FINDINGS: 24 hour radio iodine uptake is calculated at 29%, within the upper end of the normal range.  Images of the thyroid gland in 3 projections demonstrate a tiny cold nodule at the inferior pole of the RIGHT lobe.  No additional focal areas of increased or decreased  tracer localization seen.  No dominant thyroid mass identified.  IMPRESSION: Upper normal 24 hour radio iodine uptake of 29%.  Tiny cold nodule at inferior pole of RIGHT thyroid lobe without dominant thyroid mass.   Electronically Signed By: Lavonia Dana M.D. 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 Small cold nodule in R thyroid lobe >> will check a thyroid U/S.  US THYROID  Order: [id number]  Status:  Final result Visible to patient:  No (Not Released) Dx:  Cold thyroid nodule  Details   Reading Physician Reading Date Result Priority  Jacqulynn Cadet, MD 04/15/2017     Narrative    CLINICAL DATA: Other. 41 year old female with a cold nodule in the nuclear medicine thyroid uptake study  EXAM: THYROID ULTRASOUND  TECHNIQUE: Ultrasound examination of the thyroid gland and adjacent soft tissues was performed.  COMPARISON: Nuclear medicine thyroid scan 04/03/2017  FINDINGS: Parenchymal Echotexture: Moderately heterogenous  Isthmus: 0.3 cm  Right lobe: 4.2 x 1.4 x 1.7 cm  Left lobe: 4.2 x 1.4 x 1.6 cm  _________________________________________________________  Estimated total number of nodules >/= 1 cm: 1  Number of spongiform nodules >/= 2 cm not described below (TR1): 0  Number of mixed cystic and solid nodules >/= 1.5 cm not described below ([place]): 0  _________________________________________________________  Nodule # 1:  Location: Right; Inferior  Maximum size: 1.2 cm; Other 2 dimensions: 0.8 x 0.8 cm  Composition: solid/almost completely solid (2)  Echogenicity: isoechoic (1)  Shape: not taller-than-wide (0)  Margins: ill-defined (0)  Echogenic foci: none (0)  ACR TI-RADS total points: 3.  ACR TI-RADS risk category: TR3 (3 points).  ACR TI-RADS recommendations:  Given size (<1.4 cm) and appearance, this nodule does NOT meet TI-RADS criteria for biopsy or dedicated  follow-up.  _________________________________________________________  An additional small, subcentimeter echogenic nodules present in the left mid gland. This nodule does not meet criteria for further evaluation.  IMPRESSION: Bilateral thyroid nodules which do not meet criteria for further evaluation. No further follow-up is required.  The above is in keeping with the ACR TI-RADS recommendations - J Am Coll Radiol 2017;14:587-595.   Electronically Signed By: Jacqulynn Cadet M.D. On: 04/15/2017 16:09       Small dominant right thyroid nodule.  No intervention needed for now.  Philemon Kingdom, MD PhD [facility]

## 2017-03-08 DIAGNOSIS — M25512 Pain in left shoulder: Secondary | ICD-10-CM | POA: Diagnosis not present

## 2017-03-11 DIAGNOSIS — M25512 Pain in left shoulder: Secondary | ICD-10-CM | POA: Diagnosis not present

## 2017-03-12 LAB — THYROID STIMULATING IMMUNOGLOBULIN: TSI: 441 % baseline — ABNORMAL HIGH (ref <140)

## 2017-03-13 DIAGNOSIS — S46012D Strain of muscle(s) and tendon(s) of the rotator cuff of left shoulder, subsequent encounter: Secondary | ICD-10-CM | POA: Diagnosis not present

## 2017-03-13 DIAGNOSIS — M25512 Pain in left shoulder: Secondary | ICD-10-CM | POA: Diagnosis not present

## 2017-03-13 DIAGNOSIS — M6281 Muscle weakness (generalized): Secondary | ICD-10-CM | POA: Diagnosis not present

## 2017-03-19 DIAGNOSIS — M25512 Pain in left shoulder: Secondary | ICD-10-CM | POA: Diagnosis not present

## 2017-03-19 DIAGNOSIS — M6281 Muscle weakness (generalized): Secondary | ICD-10-CM | POA: Diagnosis not present

## 2017-03-19 DIAGNOSIS — S46012D Strain of muscle(s) and tendon(s) of the rotator cuff of left shoulder, subsequent encounter: Secondary | ICD-10-CM | POA: Diagnosis not present

## 2017-04-01 DIAGNOSIS — M542 Cervicalgia: Secondary | ICD-10-CM | POA: Diagnosis not present

## 2017-04-01 DIAGNOSIS — M25512 Pain in left shoulder: Secondary | ICD-10-CM | POA: Diagnosis not present

## 2017-04-01 DIAGNOSIS — M6281 Muscle weakness (generalized): Secondary | ICD-10-CM | POA: Diagnosis not present

## 2017-04-01 DIAGNOSIS — S46012D Strain of muscle(s) and tendon(s) of the rotator cuff of left shoulder, subsequent encounter: Secondary | ICD-10-CM | POA: Diagnosis not present

## 2017-04-02 ENCOUNTER — Encounter (HOSPITAL_COMMUNITY)
Admission: RE | Admit: 2017-04-02 | Discharge: 2017-04-02 | Disposition: A | Discharge status: Home / Self Care | Payer: BLUE CROSS/BLUE SHIELD | Source: Ambulatory Visit | Attending: Internal Medicine | Admitting: Internal Medicine

## 2017-04-02 DIAGNOSIS — E0590 Thyrotoxicosis, unspecified without thyrotoxic crisis or storm: Secondary | ICD-10-CM | POA: Insufficient documentation

## 2017-04-02 MED ORDER — SODIUM IODIDE I 131 CAPSULE
7.5500 | Freq: Once | INTRAVENOUS | Status: AC | PRN
  Administered 2017-04-02: 7.55 via ORAL

## 2017-04-03 ENCOUNTER — Encounter (HOSPITAL_COMMUNITY)
Admission: RE | Admit: 2017-04-03 | Discharge: 2017-04-03 | Disposition: A | Discharge status: Home / Self Care | Payer: BLUE CROSS/BLUE SHIELD | Source: Ambulatory Visit | Attending: Internal Medicine | Admitting: Internal Medicine

## 2017-04-03 DIAGNOSIS — E0590 Thyrotoxicosis, unspecified without thyrotoxic crisis or storm: Secondary | ICD-10-CM | POA: Diagnosis not present

## 2017-04-03 MED ORDER — SODIUM PERTECHNETATE TC 99M INJECTION
10.0000 | Freq: Once | INTRAVENOUS | Status: AC | PRN
  Administered 2017-04-03: 10 via INTRAVENOUS

## 2017-04-04 ENCOUNTER — Encounter: Payer: Self-pay | Admitting: Internal Medicine

## 2017-04-04 ENCOUNTER — Telehealth: Payer: Self-pay | Admitting: Internal Medicine

## 2017-04-04 DIAGNOSIS — M542 Cervicalgia: Secondary | ICD-10-CM | POA: Diagnosis not present

## 2017-04-04 NOTE — Telephone Encounter (Signed)
I will send her a msg through MyChart >> did not have time to look at the results in depth yet.

## 2017-04-04 NOTE — Telephone Encounter (Signed)
Pt is aware.  

## 2017-04-04 NOTE — Telephone Encounter (Signed)
Pt called and stated she  had a test done on the 5th.  Pt wanted to know if we will call with results from that test or if she needs to schedule a F/U to discuss them    Please advise

## 2017-04-04 NOTE — Telephone Encounter (Signed)
Please advise on below  

## 2017-04-05 ENCOUNTER — Other Ambulatory Visit: Payer: Self-pay | Admitting: Internal Medicine

## 2017-04-05 DIAGNOSIS — E041 Nontoxic single thyroid nodule: Secondary | ICD-10-CM

## 2017-04-09 DIAGNOSIS — S46012D Strain of muscle(s) and tendon(s) of the rotator cuff of left shoulder, subsequent encounter: Secondary | ICD-10-CM | POA: Diagnosis not present

## 2017-04-15 ENCOUNTER — Ambulatory Visit
Admission: RE | Admit: 2017-04-15 | Discharge: 2017-04-15 | Disposition: A | Discharge status: Home / Self Care | Payer: BLUE CROSS/BLUE SHIELD | Source: Ambulatory Visit | Attending: Internal Medicine | Admitting: Internal Medicine

## 2017-04-15 DIAGNOSIS — E042 Nontoxic multinodular goiter: Secondary | ICD-10-CM | POA: Diagnosis not present

## 2017-04-26 DIAGNOSIS — M5020 Other cervical disc displacement, unspecified cervical region: Secondary | ICD-10-CM | POA: Diagnosis not present

## 2017-04-26 DIAGNOSIS — M5412 Radiculopathy, cervical region: Secondary | ICD-10-CM | POA: Diagnosis not present

## 2017-05-14 DIAGNOSIS — M5412 Radiculopathy, cervical region: Secondary | ICD-10-CM | POA: Diagnosis not present

## 2017-05-14 DIAGNOSIS — Z6830 Body mass index (BMI) 30.0-30.9, adult: Secondary | ICD-10-CM | POA: Diagnosis not present

## 2017-05-14 DIAGNOSIS — R030 Elevated blood-pressure reading, without diagnosis of hypertension: Secondary | ICD-10-CM | POA: Diagnosis not present

## 2017-05-29 DIAGNOSIS — Z6830 Body mass index (BMI) 30.0-30.9, adult: Secondary | ICD-10-CM | POA: Diagnosis not present

## 2017-05-29 DIAGNOSIS — M5412 Radiculopathy, cervical region: Secondary | ICD-10-CM | POA: Diagnosis not present

## 2017-06-15 ENCOUNTER — Encounter: Payer: Self-pay | Admitting: Internal Medicine

## 2017-06-17 ENCOUNTER — Other Ambulatory Visit: Payer: Self-pay | Admitting: Internal Medicine

## 2017-06-17 DIAGNOSIS — E538 Deficiency of other specified B group vitamins: Secondary | ICD-10-CM

## 2017-06-17 DIAGNOSIS — E559 Vitamin D deficiency, unspecified: Secondary | ICD-10-CM

## 2017-06-17 DIAGNOSIS — E0590 Thyrotoxicosis, unspecified without thyrotoxic crisis or storm: Secondary | ICD-10-CM

## 2017-06-19 ENCOUNTER — Other Ambulatory Visit (INDEPENDENT_AMBULATORY_CARE_PROVIDER_SITE_OTHER): Payer: BLUE CROSS/BLUE SHIELD

## 2017-06-19 DIAGNOSIS — E538 Deficiency of other specified B group vitamins: Secondary | ICD-10-CM | POA: Diagnosis not present

## 2017-06-19 DIAGNOSIS — E559 Vitamin D deficiency, unspecified: Secondary | ICD-10-CM | POA: Diagnosis not present

## 2017-06-19 DIAGNOSIS — E0590 Thyrotoxicosis, unspecified without thyrotoxic crisis or storm: Secondary | ICD-10-CM

## 2017-06-19 LAB — VITAMIN B12: VITAMIN B 12: 260 pg/mL (ref 211–911)

## 2017-06-19 LAB — TSH: TSH: 0.01 u[IU]/mL — AB (ref 0.35–4.50)

## 2017-06-19 LAB — T4, FREE: Free T4: 1.2 ng/dL (ref 0.60–1.60)

## 2017-06-19 LAB — VITAMIN D 25 HYDROXY (VIT D DEFICIENCY, FRACTURES): VITD: 46.55 ng/mL (ref 30.00–100.00)

## 2017-06-19 LAB — T3, FREE: T3, Free: 5.2 pg/mL — ABNORMAL HIGH (ref 2.3–4.2)

## 2017-06-20 ENCOUNTER — Encounter: Payer: Self-pay | Admitting: Internal Medicine

## 2017-06-20 ENCOUNTER — Other Ambulatory Visit: Payer: Self-pay | Admitting: Internal Medicine

## 2017-06-20 DIAGNOSIS — E0590 Thyrotoxicosis, unspecified without thyrotoxic crisis or storm: Secondary | ICD-10-CM

## 2017-06-20 MED ORDER — METHIMAZOLE 5 MG PO TABS: 5.0000 mg | ORAL_TABLET | Freq: Every day | ORAL | 3 refills | Status: DC

## 2017-06-23 ENCOUNTER — Other Ambulatory Visit: Payer: Self-pay | Admitting: Internal Medicine

## 2017-06-30 ENCOUNTER — Other Ambulatory Visit: Payer: Self-pay | Admitting: Internal Medicine

## 2017-07-05 ENCOUNTER — Ambulatory Visit (INDEPENDENT_AMBULATORY_CARE_PROVIDER_SITE_OTHER): Payer: BLUE CROSS/BLUE SHIELD | Admitting: Internal Medicine

## 2017-07-05 ENCOUNTER — Other Ambulatory Visit: Payer: Self-pay | Admitting: Internal Medicine

## 2017-07-05 ENCOUNTER — Encounter: Payer: Self-pay | Admitting: Internal Medicine

## 2017-07-05 VITALS — BP 118/70 | HR 90 | Ht 66.0 in | Wt 173.6 lb

## 2017-07-05 DIAGNOSIS — E0590 Thyrotoxicosis, unspecified without thyrotoxic crisis or storm: Secondary | ICD-10-CM | POA: Diagnosis not present

## 2017-07-05 DIAGNOSIS — E041 Nontoxic single thyroid nodule: Secondary | ICD-10-CM | POA: Diagnosis not present

## 2017-07-05 DIAGNOSIS — E538 Deficiency of other specified B group vitamins: Secondary | ICD-10-CM | POA: Diagnosis not present

## 2017-07-05 MED ORDER — PINDOLOL 5 MG PO TABS: 5.0000 mg | ORAL_TABLET | Freq: Two times a day (BID) | ORAL | 0 refills | Status: DC

## 2017-07-05 NOTE — Patient Instructions (Signed)
Please continue: - Methimazole 5 mg daily  Restart the beta blocker.  Please come back for labs in 2.5 weeks.  Please come back for a follow-up appointment in 6 months.

## 2017-07-05 NOTE — Progress Notes (Signed)
Patient ID: Chelsey Sanford, female   DOB: Sep 01, 1975, 42 y.o.   MRN: [id number]    HPI  Chelsey Sanford is a 42 y.o.-year-old female, returning for follow-up for subclinical thyrotoxicosis (likely Graves' disease), also, vitamin D and B12 deficiencies.  Last visit 4 months ago.  She ran out of her beta blocker 5 days ago >> feels palpitations, will refill today.  She started to have hot flushes in last month.   Reviewed history:  Patient was found to have a low TSH on her annual physical exam in 11/2016. She was getting annual TFTs until then due to a family history of hypothyroidism in her mother.  The rest of her TSH levels have been normal since 2008.  Her thyroid tests initially improved in 02/2017, but the latest set of tests worsened again (subclinical thyrotoxicosis).  In 05/2017, we started methimazole 5 mg daily. She feels a little better.  Reviewed her TFTs: Lab Results  Component Value Date   TSH 0.01 (L) 06/19/2017   TSH 0.28 (L) 03/07/2017   TSH 0.01 (L) 12/14/2016   TSH 2.03 09/07/2015   TSH 1.84 05/16/2015   TSH 2.57 07/16/2014   TSH 2.50 06/18/2013   TSH 2.85 01/14/2007   FREET4 1.20 06/19/2017   FREET4 0.82 03/07/2017   FREET4 0.86 12/26/2016   FREET4 0.80 06/18/2013   T3FREE 5.2 (H) 06/19/2017   T3FREE 3.5 03/07/2017   T3FREE 3.8 12/26/2016   TPO antibodies were elevated: Component     Latest Ref Rng & Units 12/26/2016  Thyroperoxidase Ab SerPl-aCnc     <9 IU/mL 129 (H)  Thyroglobulin Ab     <2 IU/mL 1   Graves' antibodies were also elevated: Lab Results  Component Value Date   TSI 441 (H) 03/07/2017   We obtained a thyroid uptake and scan (04/03/2017): Uniform scan with the exception of a small right cold nodule, uptake at the upper limit of normal, 29%.  This was consistent with possible mild Graves' disease.  We then checked a thyroid ultrasound (04/15/2017): Small, 1.2 cm isoechoic solid nodule in the right lobe.  Also, small, subcentimeter echogenic left  nodules.  None of the nodules meet criteria for biopsy or follow-up.  Pt denies: - feeling nodules in neck - hoarseness - dysphagia  - choking - SOB with lying down  She continues to complain of: fatigue, heat intolerance (chronic), constipation, hair loss.   Pt does have a FH of thyroid ds. In Mother >> hypothyroidism. No FH of thyroid cancer. No h/o radiation tx to head or neck.  No seaweed or kelp. No recent contrast studies. No herbal supplements. No Biotin use. No recent steroids use.   Pt. also has a history of POTS dx 2008 - managed by cardiology.  Was on Florinef and Mestinon >> she is now off the Florinef. Also, B12 def., and vitamin D deficiency. She has long-standing anemia. Also, eosinophilic esophagitis and hiatal hernia >> on Protonix.  She does yoga and walks 1.5 mi with her dogs daily.  She stopped Celexa since last visit.  She will go to [place] in 10 days.  ROS: Constitutional: + see HPi Eyes: no blurry vision, no xerophthalmia ENT: no sore throat, + see HPI Cardiovascular: no CP/+ SOB/+ palpitations/no leg swelling Respiratory: no cough/+ SOB/no wheezing Gastrointestinal: no N/no V/no D/+ C/no acid reflux Musculoskeletal: + muscle aches/+ joint aches Skin: no rashes, no hair loss Neurological: no tremors/no numbness/no tingling/no dizziness  I reviewed pt's medications, allergies, PMH, social  hx, family hx, and changes were documented in the history of present illness. Otherwise, unchanged from my initial visit note.  Past Medical History:  Diagnosis Date  . ALLERGIC RHINITIS   . ANEMIA-NOS   . Anxiety   . Delayed gastric emptying    dx at [facility] by gastric emptying study per patient  . DYSAUTONOMIA   . ENDOMETRIOSIS   . GERD (gastroesophageal reflux disease) 10/06/2011  . Headache(784.0)   . Irritable bowel syndrome   . Lumbar disc disease   . Migraine    since 6th grade  . POTS (postural orthostatic tachycardia syndrome)    Past Surgical  History:  Procedure Laterality Date  . ANKLE RECONSTRUCTION  1993  . ELBOW SURGERY Right 2015  . TONSILLECTOMY  1996  . WISDOM TOOTH EXTRACTION     Social History   Socioeconomic History  . Marital status: Married    Spouse name: [name]  . Number of children: 1  . Years of education: 8  . Highest education level: Not on file  Occupational History  . Occupation: Retail banker: Self Employed    Comment: [employer]  Tobacco Use  . Smoking status: Former Smoker    Last attempt to quit: 04/30/1996    Years since quitting: 20.8  . Smokeless tobacco: Never Used  Substance and Sexual Activity  . Alcohol use: Yes    Comment: 1 time weekly - 2 drinks of winr  . Drug use: No  . Sexual activity: Yes    Birth control/protection: Pill  Other Topics Concern  . 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 citalopram (CELEXA) 20 MG tablet Take 1 tablet (20 mg total) by mouth daily. 90 tablet 1  . docusate sodium (COLACE) 100 MG capsule Take 100 mg by mouth daily.     . drospirenone-ethinyl estradiol (YASMIN,ZARAH,SYEDA) 3-0.03 MG tablet Take 1 tablet by mouth at bedtime.     Marland Kitchen ibuprofen (ADVIL,MOTRIN) 200 MG tablet Take 400 mg by mouth 2 (two) times daily as needed for headache (pain).     . Lactobacillus-Inulin (CULTURELLE DIGESTIVE HEALTH PO) Take 1 tablet by mouth daily.     . magnesium oxide (MAGOX 400) 400 (241.3 Mg) MG tablet Take 1 tablet (400 mg total) by mouth daily. 90 tablet 3  . methimazole (TAPAZOLE) 5 MG tablet Take 1 tablet (5 mg total) by mouth daily. With a meal 45 tablet 3  . ondansetron (ZOFRAN) 4 MG tablet Take 1 tablet (4 mg total) by mouth  every 8 (eight) hours as needed for nausea or vomiting. 20 tablet 0  . pantoprazole (PROTONIX) 40 MG tablet Take 1 tablet (40 mg total) by mouth daily. 90 tablet 3  . pindolol (VISKEN) 5 MG tablet TAKE 1 TABLET(5 MG) BY MOUTH DAILY 15 tablet 0  . pseudoephedrine (SUDAFED) 30 MG tablet Take 30 mg by mouth daily as needed (nasal congestion/ seasonal allergies).     . pyridostigmine (MESTINON) 60 MG tablet Take 1 tablet (60 mg total) by mouth 2 (two) times daily. 60 tablet 5  . Rivaroxaban 15 & 20 MG TBPK Take as directed on package: Start  with one 15mg  tablet by mouth twice a day with food. On Day 22, switch to one 20mg  tablet once a day with food. (Patient not taking: Reported on 12/14/2016) 51 each 0  . rizatriptan (MAXALT-MLT) 10 MG disintegrating tablet Take 1 tablet (10 mg total) by mouth as needed for migraine. May repeat in 2 hours if needed 9 tablet 11  . topiramate (TOPAMAX) 25 MG tablet Take 1 tablet (25 mg total) by mouth 2 (two) times daily. (Patient taking differently: Take 25 mg by mouth at bedtime. ) 60 tablet 6  . triamcinolone (NASACORT AQ) 55 MCG/ACT nasal inhaler Place 1 spray into both nostrils daily as needed (seasonal allergies). For stuffiness     . [DISCONTINUED] magnesium oxide (MAG-OX) 400 MG tablet Take 1 tablet (400 mg total) by mouth daily. 30 tablet 5   Current Facility-Administered Medications on File Prior to Visit  Medication Dose Route Frequency Provider Last Rate Last Dose  . gadopentetate dimeglumine (MAGNEVIST) injection 15 mL  15 mL Intravenous Once PRN Penumalli, Earlean Polka, MD       Allergies  Allergen Reactions  . Adhesive [Tape] Rash and Other (See Comments)    Skin blistered and disintegrated after epidural (looked like 3rd degree burn) - possibly due to tape and/or tegaderm - do not use anything with adhesive  . Sulfonamide Derivatives Nausea And Vomiting  . Doxycycline Nausea And Vomiting  . Sulfa Antibiotics Nausea And Vomiting  . Amoxicillin Nausea And  Vomiting and Rash  . Erythromycin Nausea And Vomiting and Rash  . Latex Rash    See notes under adhesive tape  . Penicillins Nausea And Vomiting and Rash    Has patient had a PCN reaction causing immediate rash, facial/tongue/throat swelling, SOB or lightheadedness with hypotension: Yes Has patient had a PCN reaction causing severe rash involving mucus membranes or skin necrosis: No Has patient had a PCN reaction that required hospitalization: No Has patient had a PCN reaction occurring within the last 10 years: No If all of the above answers are "NO", then may proceed with Cephalosporin use.   Family History  Problem Relation Age of Onset  . Thyroid disease Mother        hypothyroidism  . Migraines Mother   . Colon polyps Mother   . Skin cancer Mother        malignant melanoma  . Crohn's disease Maternal Grandmother   . Parkinson's disease Paternal Grandmother   . Arthritis/Rheumatoid Paternal Grandmother   . Cancer Paternal Grandfather        skin  . Parkinson's disease Paternal Grandfather   . COPD Neg Hx   . Colon cancer Neg Hx     PE: BP 118/70 (BP Location: Left Arm, Patient Position: Sitting, Cuff Size: Normal)   Pulse 90   Ht 5\' 6"  (1.676 m)   Wt 173 lb 9.6 oz (78.7 kg)   SpO2 97%   BMI 28.02 kg/m  Wt Readings from Last 3 Encounters:  07/05/17 173 lb 9.6 oz (78.7 kg)  03/07/17 174 lb (78.9 kg)  12/14/16 166 lb (75.3 kg)   Constitutional: + slightly overweight, in NAD Eyes: PERRLA, EOMI, no exophthalmos ENT: moist mucous membranes, no thyromegaly, no cervical lymphadenopathy Cardiovascular: tachycardia, RR, No MRG Respiratory: CTA B Gastrointestinal: abdomen soft, NT, ND, BS+ Musculoskeletal: no deformities, strength intact in all 4 Skin: moist, warm, no rashes Neurological: + tremor with outstretched hands, DTR normal in all 4  ASSESSMENT: 1.  Graves' disease - Thyroid uptake and  scan (04/03/2017): Upper normal 24 hour radio iodine uptake of 29%. Tiny  cold nodule at inferior pole of RIGHT thyroid lobe without dominant thyroid mass.  2.  Small thyroid nodule - Thyroid ultrasound (04/15/2017): Parenchymal Echotexture: Moderately heterogenous Isthmus: 0.3 cm Right lobe: 4.2 x 1.4 x 1.7 cm Left lobe: 4.2 x 1.4 x 1.6 cm _________________________________________________________ Nodule # 1: Location: Right; Inferior Maximum size: 1.2 cm; Other 2 dimensions: 0.8 x 0.8 cm Composition: solid/almost completely solid (2) Echogenicity: isoechoic (1) Given size (<1.4 cm) and appearance, this nodule does NOT meet TI-RADS criteria for biopsy or dedicated follow-up. __________________________________________  An additional small, subcentimeter echogenic nodules present in the left mid gland. This nodule does not meet criteria for further evaluation.  IMPRESSION: Bilateral thyroid nodules which do not meet criteria for further evaluation. No further follow-up is required.  3.  Vitamin B12 deficiency  PLAN:  1. Patient with a relatively ecently found subclinical thyrotoxicosis with elevated TSI antibodies consistent with mild Graves' disease.  We checked a thyroid uptake and scan and the scan was uniform with the exception of a small cold nodule in the right lobe of the thyroid and the uptake was at the upper limit of normal.  This is consistent with mild Graves' disease.  At the time of the scan, her TSH was slightly low, but this worsened on the last set of labs from 2 weeks ago.  At that time, we started a low-dose methimazole, 5 mg daily.  She tolerates this well and feels a little better on it. - Patient does complain of fatigue, palpitations (she is on a beta-blocker - but ran out 5 days ago), chronic heat intolerance, but no weight loss, anxiety.  She continues to have mild tremors. - We discussed about the possibility that she has mild Graves' disease.  I explained that this is an autoimmune disorder in which TSI antibodies stimulate the gland  to produce more thyroid hormones. - We discussed about possible modalities of treatment to include continuing methimazole, RAI treatment, or last resort, surgery.  We decided to continue methimazole.  I explained benefits and possible side effects - We will continue her beta-blocker blocker (she is on this for her POTS) - I will see her back in 6 months, in 2.4 weeks for repeat labs.  2.  Cold thyroid nodule - No neck compression symptoms - This is very small, and no follow-up is necessary for now  3. B12 deficiency - She has a history of B12 deficiency for which she was using intranasal formulation of B12 she was off this at last visit that she could not afford it anymore.  - At that time, I recommended to start 5000 mcg B12 daily or injections.  She opted to start the p.o. Formulation.  Philemon Kingdom, MD PhD [facility] Endocrinology

## 2017-07-06 ENCOUNTER — Other Ambulatory Visit: Payer: Self-pay | Admitting: Internal Medicine

## 2017-07-08 MED ORDER — PINDOLOL 5 MG PO TABS: 5.0000 mg | ORAL_TABLET | Freq: Two times a day (BID) | ORAL | 6 refills | Status: DC

## 2017-07-10 ENCOUNTER — Encounter: Payer: Self-pay | Admitting: Internal Medicine

## 2017-07-11 MED ORDER — CYANOCOBALAMIN 500 MCG/0.1ML NA SOLN: NASAL | 3 refills | Status: DC

## 2017-07-19 ENCOUNTER — Encounter (HOSPITAL_COMMUNITY): Payer: Self-pay | Admitting: Emergency Medicine

## 2017-07-19 ENCOUNTER — Other Ambulatory Visit: Payer: Self-pay

## 2017-07-19 ENCOUNTER — Emergency Department (HOSPITAL_COMMUNITY)
Admission: EM | Admit: 2017-07-19 | Discharge: 2017-07-19 | Disposition: A | Discharge status: Home / Self Care | Payer: BLUE CROSS/BLUE SHIELD | Attending: Emergency Medicine | Admitting: Emergency Medicine

## 2017-07-19 DIAGNOSIS — Z87891 Personal history of nicotine dependence: Secondary | ICD-10-CM | POA: Insufficient documentation

## 2017-07-19 DIAGNOSIS — Z79899 Other long term (current) drug therapy: Secondary | ICD-10-CM | POA: Diagnosis not present

## 2017-07-19 DIAGNOSIS — X32XXXA Exposure to sunlight, initial encounter: Secondary | ICD-10-CM | POA: Insufficient documentation

## 2017-07-19 DIAGNOSIS — R21 Rash and other nonspecific skin eruption: Secondary | ICD-10-CM | POA: Diagnosis not present

## 2017-07-19 DIAGNOSIS — Z91040 Latex allergy status: Secondary | ICD-10-CM | POA: Insufficient documentation

## 2017-07-19 DIAGNOSIS — L564 Polymorphous light eruption: Secondary | ICD-10-CM | POA: Insufficient documentation

## 2017-07-19 LAB — CBC
HEMATOCRIT: 39.6 % (ref 36.0–46.0)
Hemoglobin: 12.9 g/dL (ref 12.0–15.0)
MCH: 24.3 pg — AB (ref 26.0–34.0)
MCHC: 32.6 g/dL (ref 30.0–36.0)
MCV: 74.6 fL — AB (ref 78.0–100.0)
PLATELETS: 275 10*3/uL (ref 150–400)
RBC: 5.31 MIL/uL — AB (ref 3.87–5.11)
RDW: 15.2 % (ref 11.5–15.5)
WBC: 6 10*3/uL (ref 4.0–10.5)

## 2017-07-19 LAB — COMPREHENSIVE METABOLIC PANEL
ALT: 26 U/L (ref 14–54)
AST: 29 U/L (ref 15–41)
Albumin: 3.8 g/dL (ref 3.5–5.0)
Alkaline Phosphatase: 52 U/L (ref 38–126)
Anion gap: 10 (ref 5–15)
BUN: <5 mg/dL — ABNORMAL LOW (ref 6–20)
CHLORIDE: 105 mmol/L (ref 101–111)
CO2: 22 mmol/L (ref 22–32)
CREATININE: 0.7 mg/dL (ref 0.44–1.00)
Calcium: 8.8 mg/dL — ABNORMAL LOW (ref 8.9–10.3)
GFR calc non Af Amer: >60 mL/min (ref >60)
Glucose, Bld: 110 mg/dL — ABNORMAL HIGH (ref 65–99)
POTASSIUM: 3.3 mmol/L — AB (ref 3.5–5.1)
SODIUM: 137 mmol/L (ref 135–145)
Total Bilirubin: 0.5 mg/dL (ref 0.3–1.2)
Total Protein: 6.6 g/dL (ref 6.5–8.1)

## 2017-07-19 LAB — I-STAT BETA HCG BLOOD, ED (MC, WL, AP ONLY)

## 2017-07-19 LAB — URINALYSIS, ROUTINE W REFLEX MICROSCOPIC
BACTERIA UA: NONE SEEN
Bilirubin Urine: NEGATIVE
Glucose, UA: NEGATIVE mg/dL
KETONES UR: NEGATIVE mg/dL
LEUKOCYTES UA: NEGATIVE
Nitrite: NEGATIVE
PROTEIN: NEGATIVE mg/dL
Specific Gravity, Urine: 1.004 — ABNORMAL LOW (ref 1.005–1.030)
pH: 6 (ref 5.0–8.0)

## 2017-07-19 LAB — LIPASE, BLOOD: LIPASE: 39 U/L (ref 11–51)

## 2017-07-19 MED ORDER — HYDROXYZINE HCL 25 MG PO TABS: 25.0000 mg | ORAL_TABLET | Freq: Four times a day (QID) | ORAL | 0 refills | Status: DC

## 2017-07-19 MED ORDER — POTASSIUM CHLORIDE CRYS ER 20 MEQ PO TBCR
40.0000 meq | EXTENDED_RELEASE_TABLET | Freq: Once | ORAL | Status: AC
  Administered 2017-07-19: 40 meq via ORAL
  Filled 2017-07-19: qty 2

## 2017-07-19 MED ORDER — HYDROCORTISONE 1 % EX LOTN
TOPICAL_LOTION | Freq: Once | CUTANEOUS | Status: DC
  Filled 2017-07-19: qty 118

## 2017-07-19 MED ORDER — HYDROCORTISONE 1 % EX CREA
TOPICAL_CREAM | Freq: Once | CUTANEOUS | Status: AC
  Administered 2017-07-19: 08:00:00 via TOPICAL
  Filled 2017-07-19: qty 28

## 2017-07-19 MED ORDER — HYDROXYZINE HCL 25 MG PO TABS
25.0000 mg | ORAL_TABLET | Freq: Once | ORAL | Status: AC
  Administered 2017-07-19: 25 mg via ORAL
  Filled 2017-07-19: qty 1

## 2017-07-19 MED ORDER — PREDNISONE 20 MG PO TABS: 40.0000 mg | ORAL_TABLET | Freq: Every day | ORAL | 0 refills | Status: DC

## 2017-07-19 MED ORDER — HYDROCORTISONE 2.5 % EX LOTN: TOPICAL_LOTION | Freq: Two times a day (BID) | CUTANEOUS | 0 refills | Status: DC

## 2017-07-19 MED ORDER — DIPHENHYDRAMINE HCL 50 MG/ML IJ SOLN
25.0000 mg | Freq: Once | INTRAMUSCULAR | Status: AC
  Administered 2017-07-19: 25 mg via INTRAVENOUS
  Filled 2017-07-19: qty 1

## 2017-07-19 MED ORDER — METHYLPREDNISOLONE SODIUM SUCC 125 MG IJ SOLR
125.0000 mg | Freq: Once | INTRAMUSCULAR | Status: AC
  Administered 2017-07-19: 125 mg via INTRAVENOUS
  Filled 2017-07-19: qty 2

## 2017-07-19 MED ORDER — FAMOTIDINE IN NACL 20-0.9 MG/50ML-% IV SOLN
20.0000 mg | Freq: Once | INTRAVENOUS | Status: AC
  Administered 2017-07-19: 20 mg via INTRAVENOUS
  Filled 2017-07-19: qty 50

## 2017-07-19 NOTE — Discharge Instructions (Signed)
1. Medications: Atarax for itching, prednisone, hydrocortisone lotion, usual home medications 2. Treatment: rest, drink plenty of fluids, do not exposed area to the son 3. Follow Up: Please followup with your primary doctor in 2-3 days for discussion of your diagnoses and further evaluation after today's visit; if you do not have a primary care doctor use the resource guide provided to find one; Please return to the ER for worsening swelling, difficulty breathing, vomiting, worsening rash or other concerns.

## 2017-07-19 NOTE — ED Provider Notes (Signed)
Patient signed out to me by PA Kenner. Please see her complete note for history and physical.  In short patient is a 42 year old being seen for allergic reaction to possible sun poisoning after returning from the [place].  Patient was given allergic reaction cocktail in the ED.  On repeat assessment she states that she feels very much improved and was able to get some sleep and feels like her itchiness is improving.  Patient feels that she is stable for discharge at this time.  Advised patient on return precautions and symptomatic treatment at home with PCP follow-up.  Pt is hemodynamically stable, in NAD, & able to ambulate in the ED. Evaluation does not show pathology that would require ongoing emergent intervention or inpatient treatment. I explained the diagnosis to the patient. Pain has been managed & has no complaints prior to dc. Pt is comfortable with above plan and is stable for discharge at this time. All questions were answered prior to disposition. Strict return precautions for f/u to the ED were discussed. Encouraged follow up with PCP.    Doristine Devoid, PA-C 07/19/17 [id number]    Duffy Bruce, MD 07/20/17 [phone]

## 2017-07-19 NOTE — ED Triage Notes (Signed)
"  I think I an having an allergic reaction to the sun burn."  Pt also complains of food poisoning, upset stomach w/ a lot of nausea and loose stool from [place]. Pt feels her neck is swelling, pt does not appear to be in respiratory distress, good breath sounds and able to speak in complete sentences.

## 2017-07-19 NOTE — ED Provider Notes (Signed)
[facility] EMERGENCY DEPARTMENT Provider Note   CSN: [id number] Arrival date & time: 07/19/17  0445     History   Chief Complaint Chief Complaint  Patient presents with  . Allergic Reaction    HPI Chelsey Sanford is a 42 y.o. female with a hx of dysautonomia, anemia, anxiety, endometriosis, GERD, irritable bowel syndrome, migraine, POTS presents to the Emergency Department complaining of gradual, persistent, progressively worsening rash onset 3 days ago.  Patient reports that she went to the [place] and was exposed to the sun for the first time this season.  She reports a history of "sun poisoning" but never as severe as today.  She states that she did wear a sun shirt and sunscreen and does not believe that she got sunburned at any time.  She reports that the rash began on her chest and has now spread around her neck, down onto her breasts, down her back and onto her face.  She reports that is itching and has occasional pinprick-like sensations.  She states that it began as "small dots" but those have merged together.  She reports taking Benadryl without significant relief.  Nothing seems to make the symptoms better.  Additional sun exposure seems to make them worse.  Patient also reports that 4 days ago she had nausea, vomiting and diarrhea after contracting food poisoning.  She reports it has been 3 days since her last bout of emesis.  She denies difficulty breathing.  She states that the rash on her face feels swollen but she has no swelling of her tongue or difficulty swallowing.  She denies changes in her voice.   The history is provided by the patient and medical records. No language interpreter was used.    Past Medical History:  Diagnosis Date  . ALLERGIC RHINITIS   . ANEMIA-NOS   . Anxiety   . Delayed gastric emptying    dx at [facility] by gastric emptying study per patient  . DYSAUTONOMIA   . ENDOMETRIOSIS   . GERD (gastroesophageal reflux disease) 10/06/2011  .  Headache(784.0)   . Irritable bowel syndrome   . Lumbar disc disease   . Migraine    since 6th grade  . POTS (postural orthostatic tachycardia syndrome)     Patient Active Problem List   Diagnosis Date Noted  . Cold thyroid nodule 04/05/2017  . 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 Fatigue 12/14/2016  . 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 Photosensitivity 09/04/2016  . Vitamin D deficiency 08/13/2016  . Vomiting 07/31/2016  . Diarrhea 07/31/2016  . 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 Dysautonomia ([place]) 05/16/2015  . B12 deficiency 05/16/2015  . Superficial phlebitis 04/29/2013  . Contact dermatitis 09/03/2012  . Migraine headache   . GERD (gastroesophageal reflux disease) 10/06/2011  . Vertigo 10/05/2011  . Anxiety state 05/03/2010  . ALLERGIC RHINITIS 05/03/2010  . ANEMIA-NOS 12/26/2007  . POTS (postural orthostatic tachycardia syndrome) 12/26/2007  . Irritable bowel syndrome 09/08/2007  . ENDOMETRIOSIS 07/21/2007    Past Surgical History:  Procedure Laterality Date  . ANKLE RECONSTRUCTION  1993  . ELBOW SURGERY Right 2015  . TONSILLECTOMY  1996  . WISDOM TOOTH EXTRACTION      OB History   None      Home Medications    Prior to Admission medications   Medication Sig Start Date End Date Taking? Authorizing Provider  Cholecalciferol (VITAMIN D PO) Take 1 tablet by mouth daily.   Yes [provider]  Cyanocobalamin 500 MCG/0.1ML SOLN 1 spray in one nostril qwk Patient taking differently: Place into the nose once a week. On Friday 07/11/17  Yes Burns, Claudina Lick, MD  diphenhydrAMINE (BENADRYL) 25 MG tablet Take 50 mg by mouth every 6 (six) hours as needed for itching or allergies.   Yes [provider]  Lactobacillus-Inulin ([place] PO) Take 1 tablet by mouth daily.    Yes [provider]  methimazole (TAPAZOLE) 5 MG tablet Take 1 tablet (5 mg total) by mouth daily. With a meal  06/20/17  Yes Philemon Kingdom, MD  norethindrone (NORLYDA) 0.35 MG tablet Take 1 tablet by mouth daily.   Yes [provider]  pantoprazole (PROTONIX) 40 MG tablet Take 1 tablet (40 mg total) by mouth daily. 12/14/16  Yes Burns, Claudina Lick, MD  pindolol (VISKEN) 5 MG tablet Take 1 tablet (5 mg total) by mouth 2 (two) times daily. 07/08/17  Yes Fay Records, MD  pyridostigmine (MESTINON) 60 MG tablet Take 1 tablet (60 mg total) by mouth 2 (two) times daily. 11/05/16  Yes Fay Records, MD  rizatriptan (MAXALT-MLT) 10 MG disintegrating tablet Take 1 tablet (10 mg total) by mouth as needed for migraine. May repeat in 2 hours if needed 09/19/16  Yes Penumalli, Earlean Polka, MD  citalopram (CELEXA) 20 MG tablet Take 1 tablet (20 mg total) by mouth daily. Patient not taking: Reported on 07/05/2017 07/23/16   Fay Records, MD  hydrocortisone 2.5 % lotion Apply topically 2 (two) times daily. 07/19/17   Chynah Orihuela, Jarrett Soho, PA-C  hydrOXYzine (ATARAX/VISTARIL) 25 MG tablet Take 1 tablet (25 mg total) by mouth every 6 (six) hours. 07/19/17   Jacquilyn Seldon, Jarrett Soho, PA-C  magnesium oxide (MAGOX 400) 400 (241.3 Mg) MG tablet Take 1 tablet (400 mg total) by mouth daily. Patient not taking: Reported on 07/19/2017 05/16/15   Binnie Rail, MD  ondansetron (ZOFRAN) 4 MG tablet Take 1 tablet (4 mg total) by mouth every 8 (eight) hours as needed for nausea or vomiting. Patient not taking: Reported on 07/19/2017 07/31/16   Biagio Borg, MD  predniSONE (DELTASONE) 20 MG tablet Take 2 tablets (40 mg total) by mouth daily. 07/19/17   Amoreena Neubert, Jarrett Soho, PA-C  Rivaroxaban 15 & 20 MG TBPK Take as directed on package: Start with one 15mg  tablet by mouth twice a day with food. On Day 22, switch to one 20mg  tablet once a day with food. Patient not taking: Reported on 07/19/2017 09/22/16   Lajean Saver, MD  topiramate (TOPAMAX) 25 MG tablet Take 1 tablet (25 mg total) by mouth 2 (two) times daily. Patient not taking: Reported on  07/19/2017 09/19/16   Penumalli, Earlean Polka, MD  magnesium oxide (MAG-OX) 400 MG tablet Take 1 tablet (400 mg total) by mouth daily. 11/16/11 03/24/13  Rowe Clack, MD    Family History Family History  Problem Relation Age of Onset  . Thyroid disease Mother        hypothyroidism  . Migraines Mother   . Colon polyps Mother   . Skin cancer Mother        malignant melanoma  . Crohn's disease Maternal Grandmother   . Parkinson's disease Paternal Grandmother   . Arthritis/Rheumatoid Paternal Grandmother   . Cancer Paternal Grandfather        skin  . Parkinson's disease Paternal Grandfather   . COPD Neg Hx   . Colon cancer Neg Hx     Social History Social History   Tobacco  Use  . Smoking status: Former Smoker    Last attempt to quit: 04/30/1996    Years since quitting: 21.2  . Smokeless tobacco: Never Used  Substance Use Topics  . Alcohol use: Yes    Comment: several times weekly  . Drug use: No     Allergies   Adhesive [tape]; Sulfonamide derivatives; Doxycycline; Sulfa antibiotics; Amoxicillin; Erythromycin; Latex; and Penicillins   Review of Systems Review of Systems  Constitutional: Negative for appetite change, diaphoresis, fatigue, fever and unexpected weight change.  HENT: Positive for facial swelling. Negative for mouth sores.   Eyes: Negative for visual disturbance.  Respiratory: Negative for cough, chest tightness, shortness of breath and wheezing.   Cardiovascular: Negative for chest pain.  Gastrointestinal: Negative for abdominal pain, constipation, diarrhea, nausea and vomiting.  Endocrine: Negative for polydipsia, polyphagia and polyuria.  Genitourinary: Negative for dysuria, frequency, hematuria and urgency.  Musculoskeletal: Negative for back pain and neck stiffness.  Skin: Positive for rash.  Allergic/Immunologic: Negative for immunocompromised state.  Neurological: Negative for syncope, light-headedness and headaches.  Hematological: Does not  bruise/bleed easily.  Psychiatric/Behavioral: Negative for sleep disturbance. The patient is not nervous/anxious.      Physical Exam Updated Vital Signs BP 137/82 (BP Location: Right Arm)   Pulse 78   Temp 98.1 F (36.7 C) (Oral)   Resp 16   Ht 5\' 6"  (1.676 m)   Wt 76.2 kg (168 lb)   SpO2 100%   BMI 27.12 kg/m   Physical Exam  Constitutional: She is oriented to person, place, and time. She appears well-developed and well-nourished. No distress.  HENT:  Head: Normocephalic and atraumatic.  Right Ear: Tympanic membrane, external ear and ear canal normal.  Left Ear: Tympanic membrane, external ear and ear canal normal.  Nose: Nose normal. No mucosal edema or rhinorrhea.  Mouth/Throat: Uvula is midline. No uvula swelling. No oropharyngeal exudate, posterior oropharyngeal edema, posterior oropharyngeal erythema or tonsillar abscesses.  No swelling of the uvula or oropharynx   Eyes: Conjunctivae are normal.  Neck: Normal range of motion.  Patent airway No stridor; normal phonation Handling secretions without difficulty  Cardiovascular: Normal rate, normal heart sounds and intact distal pulses.  No murmur heard. Pulmonary/Chest: Effort normal and breath sounds normal. No stridor. No respiratory distress. She has no wheezes.  No wheezes or rhonchi  Abdominal: Soft. Bowel sounds are normal. There is no tenderness.  Musculoskeletal: Normal range of motion. She exhibits no edema.  Neurological: She is alert and oriented to person, place, and time.  Skin: Skin is warm and dry. Rash noted. She is not diaphoretic.  Rash with erythematous papules which seem to have coalesced along her chest and back of her neck.  Papules and vesicles noted along the bilateral cheeks.  Skin underneath her chin is spared.  No rash to her arms, legs, abdomen, mid to low back.  No ulcerations, open wounds or visible excoriations.  Psychiatric: She has a normal mood and affect.  Nursing note and vitals  reviewed.    ED Treatments / Results  Labs (all labs ordered are listed, but only abnormal results are displayed) Labs Reviewed  COMPREHENSIVE METABOLIC PANEL - Abnormal; Notable for the following components:      Result Value   Potassium 3.3 (*)    Glucose, Bld 110 (*)    BUN <5 (*)    Calcium 8.8 (*)    All other components within normal limits  CBC - Abnormal; Notable for the following components:  RBC 5.31 (*)    MCV 74.6 (*)    MCH 24.3 (*)    All other components within normal limits  URINALYSIS, ROUTINE W REFLEX MICROSCOPIC - Abnormal; Notable for the following components:   Specific Gravity, Urine 1.004 (*)    Hgb urine dipstick MODERATE (*)    Squamous Epithelial / LPF 0-5 (*)    All other components within normal limits  LIPASE, BLOOD  I-STAT BETA HCG BLOOD, ED ([facility], [facility], [facility] ONLY)    Procedures Procedures (including critical care time)  Medications Ordered in ED Medications  diphenhydrAMINE (BENADRYL) injection 25 mg (25 mg Intravenous Given 07/19/17 0603)  methylPREDNISolone sodium succinate (SOLU-MEDROL) 125 mg/2 mL injection 125 mg (125 mg Intravenous Given 07/19/17 0603)  famotidine (PEPCID) IVPB 20 mg premix (0 mg Intravenous Stopped 07/19/17 0723)  hydrocortisone cream 1 % ( Topical Given 07/19/17 0737)  hydrOXYzine (ATARAX/VISTARIL) tablet 25 mg (25 mg Oral Given 07/19/17 0728)  potassium chloride SA (K-DUR,KLOR-CON) CR tablet 40 mEq (40 mEq Oral Given 07/19/17 0728)     Initial Impression / Assessment and Plan / ED Course  I have reviewed the triage vital signs and the nursing notes.  Pertinent labs & imaging results that were available during my care of the patient were reviewed by me and considered in my medical decision making (see chart for details).  Clinical Course as of Jul 20 755  Fri Jul 19, 2017  0641 Pt continues to itch.  No improvement in rash.  No progression of swelling.  Handling secretions   [HM]    Clinical Course User Index [HM]  Marlene Beidler, Jarrett Soho, [place]    Patient presents with rash and evidence of allergic reaction.  No evidence of anaphylaxis.  Patient without hypotension, difficulty breathing, hypoxia or swelling of her tongue.  She is handling secretions without difficulty and has no difficulty speaking.  Rash is in the sun touched regions and is consistent with polymorphous light eruption.  She has had something similar in the past but this time is worse.  She has been given Benadryl, Solu-Medrol, Pepcid and Atarax.  Patient without signs of airway compromise.   7:55 AM At shift change care was transferred to [facility], PA-C who will re-evaulate.  If patient remains without airway compromise she may be discharged home.  If discharged, she will need to have follow-up with her primary care physician.  She is to return to the emergency department for new or worsening symptoms including signs or symptoms of difficulty breathing.  Final Clinical Impressions(s) / ED Diagnoses   Final diagnoses:  Polymorphous light eruption    ED Discharge Orders        Ordered    hydrocortisone 2.5 % lotion  2 times daily     07/19/17 0650    predniSONE (DELTASONE) 20 MG tablet  Daily     07/19/17 0650    hydrOXYzine (ATARAX/VISTARIL) 25 MG tablet  Every 6 hours     07/19/17 0650       Rivka Baune, Jarrett Soho, PA-C 07/19/17 0757    Ward, Delice Bison, DO 07/23/17 2355

## 2017-07-23 NOTE — Progress Notes (Signed)
Subjective:    Patient ID: Chelsey Sanford, female    DOB: [birth year], 42 y.o.   MRN: [id number]  HPI The patient is here for follow up from the ED.  She returned from [place].  She went to the ED for possible sun poisoning - she had a gradual, persitent progressively worsening rash for 3 days.  It started on her chest, then spread to her neck, breasts, back and face.  She had itching and pinprick sensation. It started like small dots and they merged together.   She took bendaryl without improvement.  Four days prior while on vacation she had nausea, vomiting and diarrhea after food poisoning.  She was not experiencing any difficulty breathing.  Her face felt swollen, but no swelling of tongue or difficulty swallowing.  She received the allergic reaction cocktail in the ED (Benadryl, solu-medrol, pepcid, and atarax).  She felt much improved.  Her itchiness improved.    Discharged home with hydrocortisone cream, prednisone 40 mg daily, atarax 25 mg Q 6 hrs prn.   She took the last prednisone today.  She is taking benadryl at night.  She is also taking Claritin daily for allergies.  She stopped the steroid cream.  She did take the Atarax a couple of times, but has not taken it since then.  Overall she feels much better.  She is still experiencing some itching in her neck that is worse at times.  She denies any swelling, respiratory symptoms, sore throat or difficulty breathing.  She was compliant with sunscreen use and is concerned about the possibility of this happening again.  Medications and allergies reviewed with patient and updated if appropriate.  Patient Active Problem List   Diagnosis Date Noted  . Cold thyroid nodule 04/05/2017  . Graves' disease 03/07/2017  . Fatigue 12/14/2016  . 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 Photosensitivity 09/04/2016  . Vitamin D deficiency 08/13/2016  . Vomiting 07/31/2016  . Diarrhea 07/31/2016  . Spondylosis of lumbar region without myelopathy or radiculopathy  07/10/2016  . Dysautonomia ([place]) 05/16/2015  . B12 deficiency 05/16/2015  . Superficial phlebitis 04/29/2013  . Contact dermatitis 09/03/2012  . Migraine headache   . GERD (gastroesophageal reflux disease) 10/06/2011  . Vertigo 10/05/2011  . Anxiety state 05/03/2010  . ALLERGIC RHINITIS 05/03/2010  . ANEMIA-NOS 12/26/2007  . POTS (postural orthostatic tachycardia syndrome) 12/26/2007  . Irritable bowel syndrome 09/08/2007  . ENDOMETRIOSIS 07/21/2007    Current Outpatient Medications on File Prior to Visit  Medication Sig Dispense Refill  . Cholecalciferol (VITAMIN D PO) Take 1 tablet by mouth daily.    . Cyanocobalamin 500 MCG/0.1ML SOLN 1 spray in one nostril qwk (Patient taking differently: Place into the nose once a week. On Friday) 3 Bottle 3  . diphenhydrAMINE (BENADRYL) 25 MG tablet Take 50 mg by mouth every 6 (six) hours as needed for itching or allergies.    . Lactobacillus-Inulin (C[place]URE[place] DIGESTIVE HEALTH PO) Take 1 tablet by mouth daily.     . magnesium oxide (MAGOX 400) 400 (241.3 Mg) MG tablet Take 1 tablet (400 mg total) by mouth daily. 90 tablet 3  . methimazole (TAPAZOLE) 5 MG tablet Take 1 tablet (5 mg total) by mouth daily. With a meal 45 tablet 3  . norethindrone (NORLYDA) 0.35 MG tablet Take 1 tablet by mouth daily.    . ondansetron (ZOFRAN) 4 MG tablet Take 1 tablet (4 mg total) by mouth every 8 (eight) hours as needed for nausea or vomiting. 20 tablet 0  .  pantoprazole (PROTONIX) 40 MG tablet Take 1 tablet (40 mg total) by mouth daily. 90 tablet 3  . pindolol (VISKEN) 5 MG tablet Take 1 tablet (5 mg total) by mouth 2 (two) times daily. 60 tablet 6  . pyridostigmine (MESTINON) 60 MG tablet Take 1 tablet (60 mg total) by mouth 2 (two) times daily. 60 tablet 5  . rizatriptan (MAXALT-MLT) 10 MG disintegrating tablet Take 1 tablet (10 mg total) by mouth as needed for migraine. May repeat in 2 hours if needed 9 tablet 11  . topiramate (TOPAMAX) 25 MG tablet Take 1  tablet (25 mg total) by mouth 2 (two) times daily. 60 tablet 6  . [DISCONTINUED] magnesium oxide (MAG-OX) 400 MG tablet Take 1 tablet (400 mg total) by mouth daily. 30 tablet 5   No current facility-administered medications on file prior to visit.     Past Medical History:  Diagnosis Date  . ALLERGIC RHINITIS   . ANEMIA-NOS   . Anxiety   . Delayed gastric emptying    dx at [facility] by gastric emptying study per patient  . DYSAUTONOMIA   . ENDOMETRIOSIS   . GERD (gastroesophageal reflux disease) 10/06/2011  . Headache(784.0)   . Irritable bowel syndrome   . Lumbar disc disease   . Migraine    since 6th grade  . POTS (postural orthostatic tachycardia syndrome)     Past Surgical History:  Procedure Laterality Date  . ANKLE RECONSTRUCTION  1993  . ELBOW SURGERY Right 2015  . TONSILLECTOMY  1996  . WISDOM TOOTH EXTRACTION      Social History   Socioeconomic History  . Marital status: Married    Spouse name: [name]  . Number of children: 1  . Years of education: 83  . Highest education level: Not on file  Occupational History  . 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 Financial resource strain: Not on file  . Food insecurity:    Worry: Not on file    Inability: Not on file  . Transportation needs:    Medical: Not on file    Non-medical: Not on file  Tobacco Use  . Smoking status: Former Smoker    Last attempt to quit: 04/30/1996    Years since quitting: 21.2  . Smokeless tobacco: Never Used  Substance and Sexual Activity  . Alcohol use: Yes    Comment: several times weekly  . Drug use: No  . Sexual activity: Yes    Birth control/protection: Pill  Lifestyle  . Physical activity:    Days per week: Not on file    Minutes per session: Not on file  . Stress: Not on file  Relationships  . Social connections:    Talks on phone: Not on file    Gets together: Not on file    Attends religious  service: Not on file    Active member of club or organization: Not on file    Attends meetings of clubs or organizations: Not on file    Relationship status: Not on file  Other Topics Concern  . 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 Thyroid disease Mother        hypothyroidism  . Migraines Mother   . Colon polyps Mother   . Skin cancer Mother        malignant melanoma  . Crohn's disease Maternal Grandmother   . Parkinson's disease Paternal Grandmother   . Arthritis/Rheumatoid Paternal Grandmother   . Cancer Paternal Grandfather        skin  . Parkinson's disease Paternal Grandfather   . COPD Neg Hx   . Colon cancer Neg Hx     Review of Systems  Constitutional: Negative for chills and fever.  HENT: Negative for sore throat and trouble swallowing.   Respiratory: Negative for cough, shortness of breath and wheezing.   Skin: Negative for rash.       Itching neck-intermittent  Neurological: Negative for light-headedness and headaches.       Objective:   Vitals:   07/24/17 1412  BP: 122/84  Pulse: 70  Resp: 16  Temp: 98.2 F (36.8 C)  SpO2: 99%   BP Readings from Last 3 Encounters:  07/24/17 122/84  07/19/17 121/69  07/05/17 118/70   Wt Readings from Last 3 Encounters:  07/24/17 171 lb (77.6 kg)  07/19/17 168 lb (76.2 kg)  07/05/17 173 lb 9.6 oz (78.7 kg)   Body mass index is 27.6 kg/m.   Physical Exam    GENERAL APPEARANCE: Appears stated age, well appearing, NAD EYES: conjunctiva clear, no icterus HEENT: bilateral tympanic membranes and ear canals normal, oropharynx with no swelling or erythema, no thyromegaly, trachea midline, no cervical or supraclavicular lymphadenopathy LUNGS: Clear to  auscultation without wheeze or crackles, unlabored breathing, good air entry bilaterally CARDIOVASCULAR: Normal S1,S2 without murmurs, no edema SKIN: Warm, dry, no visible rash      Assessment & Plan:    See Problem List for Assessment and Plan of chronic medical problems.

## 2017-07-24 ENCOUNTER — Ambulatory Visit: Payer: BLUE CROSS/BLUE SHIELD | Admitting: Internal Medicine

## 2017-07-24 ENCOUNTER — Other Ambulatory Visit (INDEPENDENT_AMBULATORY_CARE_PROVIDER_SITE_OTHER): Payer: BLUE CROSS/BLUE SHIELD

## 2017-07-24 ENCOUNTER — Encounter: Payer: Self-pay | Admitting: Internal Medicine

## 2017-07-24 VITALS — BP 122/84 | HR 70 | Temp 98.2°F | Resp 16 | Wt 171.0 lb

## 2017-07-24 DIAGNOSIS — L568 Other specified acute skin changes due to ultraviolet radiation: Secondary | ICD-10-CM | POA: Insufficient documentation

## 2017-07-24 DIAGNOSIS — E0590 Thyrotoxicosis, unspecified without thyrotoxic crisis or storm: Secondary | ICD-10-CM | POA: Diagnosis not present

## 2017-07-24 LAB — T4, FREE: FREE T4: 0.86 ng/dL (ref 0.60–1.60)

## 2017-07-24 LAB — T3, FREE: T3 FREE: 3.2 pg/mL (ref 2.3–4.2)

## 2017-07-24 LAB — TSH: TSH: 0.18 u[IU]/mL — ABNORMAL LOW (ref 0.35–4.50)

## 2017-07-24 NOTE — Patient Instructions (Addendum)
Take zyrtec or xyzal daily for allergies and to suppress the itching.    Call if your rash, itching or swelling recurs.

## 2017-07-24 NOTE — Assessment & Plan Note (Signed)
Sun poisoning 06/2017 after vacationing in the [place] with rash on neck, face, chest and arms was very itchy and associated with a pinprick sensation Had beginnings of swelling and she did go to the ED Successfully treated with steroids, Atarax, Benadryl and hydrocortisone cream Completed above Will take Benadryl as needed Change from Claritin to Zyrtec and take regularly for allergies as well is taking it for a couple of weeks to prevent this from recurring She finished the steroid today and if her symptoms start to recur she will call me will put her on a steroid taper Discussed prevention in the future Follows with dermatology regularly and will also discuss with dermatology

## 2017-07-25 ENCOUNTER — Encounter: Payer: Self-pay | Admitting: Internal Medicine

## 2017-07-25 ENCOUNTER — Other Ambulatory Visit: Payer: Self-pay | Admitting: Internal Medicine

## 2017-07-25 DIAGNOSIS — E0500 Thyrotoxicosis with diffuse goiter without thyrotoxic crisis or storm: Secondary | ICD-10-CM

## 2017-07-25 MED ORDER — METHIMAZOLE 5 MG PO TABS: ORAL_TABLET | ORAL | 3 refills | Status: DC

## 2017-08-28 DIAGNOSIS — J3089 Other allergic rhinitis: Secondary | ICD-10-CM | POA: Diagnosis not present

## 2017-09-16 ENCOUNTER — Other Ambulatory Visit: Payer: Self-pay | Admitting: Internal Medicine

## 2017-11-25 ENCOUNTER — Other Ambulatory Visit: Payer: Self-pay | Admitting: Internal Medicine

## 2017-12-09 ENCOUNTER — Encounter: Payer: Self-pay | Admitting: Internal Medicine

## 2017-12-09 ENCOUNTER — Other Ambulatory Visit: Payer: Self-pay | Admitting: Endocrinology

## 2017-12-09 DIAGNOSIS — E0590 Thyrotoxicosis, unspecified without thyrotoxic crisis or storm: Secondary | ICD-10-CM | POA: Insufficient documentation

## 2017-12-10 ENCOUNTER — Other Ambulatory Visit: Payer: Self-pay

## 2017-12-10 ENCOUNTER — Telehealth: Payer: Self-pay

## 2017-12-10 MED ORDER — PINDOLOL 5 MG PO TABS: 5.0000 mg | ORAL_TABLET | Freq: Two times a day (BID) | ORAL | 1 refills | Status: DC

## 2017-12-10 MED ORDER — PYRIDOSTIGMINE BROMIDE 60 MG PO TABS: 60.0000 mg | ORAL_TABLET | Freq: Two times a day (BID) | ORAL | 1 refills | Status: DC

## 2017-12-10 NOTE — Telephone Encounter (Signed)
Ok to refill pyridostigmine and pindolol for 90 days, with 1 refill please.  Pt will need clinic appointment.

## 2017-12-10 NOTE — Telephone Encounter (Signed)
Pt is requesting that Rx pyridostigmine 60 mg and pindolol 5 mg be put on 90 day supply. Please address. Thank You.

## 2017-12-11 ENCOUNTER — Other Ambulatory Visit: Payer: Self-pay | Admitting: Internal Medicine

## 2017-12-11 DIAGNOSIS — Z1231 Encounter for screening mammogram for malignant neoplasm of breast: Secondary | ICD-10-CM

## 2017-12-24 ENCOUNTER — Other Ambulatory Visit (INDEPENDENT_AMBULATORY_CARE_PROVIDER_SITE_OTHER): Payer: BLUE CROSS/BLUE SHIELD

## 2017-12-24 ENCOUNTER — Other Ambulatory Visit: Payer: Self-pay | Admitting: Internal Medicine

## 2017-12-24 DIAGNOSIS — E0500 Thyrotoxicosis with diffuse goiter without thyrotoxic crisis or storm: Secondary | ICD-10-CM

## 2017-12-24 LAB — TSH: TSH: 3.02 u[IU]/mL (ref 0.35–4.50)

## 2017-12-24 LAB — T3, FREE: T3 FREE: 3.5 pg/mL (ref 2.3–4.2)

## 2017-12-24 LAB — T4, FREE: Free T4: 0.72 ng/dL (ref 0.60–1.60)

## 2018-01-02 ENCOUNTER — Encounter: Payer: Self-pay | Admitting: Internal Medicine

## 2018-01-06 ENCOUNTER — Ambulatory Visit: Payer: BLUE CROSS/BLUE SHIELD | Admitting: Internal Medicine

## 2018-01-14 ENCOUNTER — Other Ambulatory Visit: Payer: Self-pay | Admitting: Diagnostic Neuroimaging

## 2018-01-17 ENCOUNTER — Telehealth: Payer: Self-pay | Admitting: Internal Medicine

## 2018-01-17 MED ORDER — RIZATRIPTAN BENZOATE 10 MG PO TBDP: 10.0000 mg | ORAL_TABLET | ORAL | 2 refills | Status: DC | PRN

## 2018-01-17 NOTE — Telephone Encounter (Signed)
Pt aware Rx sent.  

## 2018-01-17 NOTE — Telephone Encounter (Signed)
Please advise 

## 2018-01-17 NOTE — Telephone Encounter (Signed)
Ok to refill 

## 2018-01-17 NOTE — Telephone Encounter (Signed)
Copied from [place] [id number]. Topic: Quick Communication - See Telephone Encounter >> Jan 17, 2018  9:48 AM Conception Chancy, NT wrote: CRM for notification. See Telephone encounter for: 01/17/18.  Patient is calling and states that she just spoke with her neurologist and they told her since she does not see the neurologist regularly since she was only seeing him for migraines and all the test were fine. He suggest that her primary doctor manage her migraine medicine. She is wanting to know can Dr. Quay Burow refill the following medication. She states if she needs to come in she will but this medicine works well for her and she has had a migraine for 3 days.  rizatriptan (MAXALT-MLT) 10 MG disintegrating tablet  [id number] [address], [address] AT [facility] [address] [place] [address] Phone: [phone] Fax: [phone]

## 2018-01-27 ENCOUNTER — Ambulatory Visit
Admission: RE | Admit: 2018-01-27 | Discharge: 2018-01-27 | Disposition: A | Discharge status: Home / Self Care | Payer: BLUE CROSS/BLUE SHIELD | Source: Ambulatory Visit | Attending: Internal Medicine | Admitting: Internal Medicine

## 2018-01-27 DIAGNOSIS — Z1231 Encounter for screening mammogram for malignant neoplasm of breast: Secondary | ICD-10-CM

## 2018-02-11 ENCOUNTER — Other Ambulatory Visit: Payer: Self-pay | Admitting: Internal Medicine

## 2018-02-11 DIAGNOSIS — Z23 Encounter for immunization: Secondary | ICD-10-CM | POA: Diagnosis not present

## 2018-02-11 DIAGNOSIS — Z86018 Personal history of other benign neoplasm: Secondary | ICD-10-CM | POA: Diagnosis not present

## 2018-02-11 DIAGNOSIS — Z808 Family history of malignant neoplasm of other organs or systems: Secondary | ICD-10-CM | POA: Diagnosis not present

## 2018-02-11 DIAGNOSIS — D225 Melanocytic nevi of trunk: Secondary | ICD-10-CM | POA: Diagnosis not present

## 2018-02-11 DIAGNOSIS — L821 Other seborrheic keratosis: Secondary | ICD-10-CM | POA: Diagnosis not present

## 2018-02-20 DIAGNOSIS — Z124 Encounter for screening for malignant neoplasm of cervix: Secondary | ICD-10-CM | POA: Diagnosis not present

## 2018-02-20 DIAGNOSIS — Z01419 Encounter for gynecological examination (general) (routine) without abnormal findings: Secondary | ICD-10-CM | POA: Diagnosis not present

## 2018-02-20 DIAGNOSIS — N939 Abnormal uterine and vaginal bleeding, unspecified: Secondary | ICD-10-CM | POA: Diagnosis not present

## 2018-02-20 DIAGNOSIS — Z130 Encounter for screening for diseases of the blood and blood-forming organs and certain disorders involving the immune mechanism: Secondary | ICD-10-CM | POA: Diagnosis not present

## 2018-02-20 LAB — HM PAP SMEAR

## 2018-02-21 DIAGNOSIS — Z124 Encounter for screening for malignant neoplasm of cervix: Secondary | ICD-10-CM | POA: Diagnosis not present

## 2018-02-24 ENCOUNTER — Encounter: Payer: Self-pay | Admitting: Internal Medicine

## 2018-02-27 DIAGNOSIS — N939 Abnormal uterine and vaginal bleeding, unspecified: Secondary | ICD-10-CM | POA: Diagnosis not present

## 2018-02-28 DIAGNOSIS — L719 Rosacea, unspecified: Secondary | ICD-10-CM | POA: Diagnosis not present

## 2018-03-03 DIAGNOSIS — N939 Abnormal uterine and vaginal bleeding, unspecified: Secondary | ICD-10-CM | POA: Diagnosis not present

## 2018-03-14 ENCOUNTER — Other Ambulatory Visit: Payer: Self-pay | Admitting: Internal Medicine

## 2018-03-22 ENCOUNTER — Other Ambulatory Visit: Payer: Self-pay | Admitting: Internal Medicine

## 2018-03-25 ENCOUNTER — Other Ambulatory Visit: Payer: Self-pay | Admitting: Internal Medicine

## 2018-03-25 ENCOUNTER — Other Ambulatory Visit: Payer: Self-pay

## 2018-03-25 ENCOUNTER — Encounter: Payer: Self-pay | Admitting: Internal Medicine

## 2018-03-25 MED ORDER — METHIMAZOLE 5 MG PO TABS: ORAL_TABLET | ORAL | 2 refills | Status: DC

## 2018-03-25 MED ORDER — PANTOPRAZOLE SODIUM 40 MG PO TBEC: 40.0000 mg | DELAYED_RELEASE_TABLET | Freq: Every day | ORAL | 0 refills | Status: DC

## 2018-04-15 DIAGNOSIS — N920 Excessive and frequent menstruation with regular cycle: Secondary | ICD-10-CM | POA: Diagnosis not present

## 2018-04-15 DIAGNOSIS — N939 Abnormal uterine and vaginal bleeding, unspecified: Secondary | ICD-10-CM | POA: Diagnosis not present

## 2018-05-01 ENCOUNTER — Telehealth: Payer: Self-pay | Admitting: *Deleted

## 2018-05-01 ENCOUNTER — Other Ambulatory Visit: Payer: Self-pay | Admitting: Internal Medicine

## 2018-05-01 NOTE — Telephone Encounter (Signed)
Called and left a voice message for the patient to try to get her scheduled for an appointment.

## 2018-05-01 NOTE — Telephone Encounter (Signed)
   [facility] Pre-operative Risk Assessment    Request for surgical clearance:  1. What type of surgery is being performed? MINERVA PROCEDURE   2. When is this surgery scheduled? 05/13/18   3. What type of clearance is required (medical clearance vs. Pharmacy clearance to hold med vs. Both)? MEDICAL  4. Are there any medications that need to be held prior to surgery and how long?NO ANTICOAGS ON MED LIST   5. Practice name and name of physician performing surgery? [facility]; DR. Roderic Palau McCULLEN   6. What is your office phone number [phone]    7.   What is your office fax number [phone]  8.   Anesthesia type (None, local, MAC, general) ? GENERAL   Chelsey Sanford 05/01/2018, 12:32 PM  _________________________________________________________________   (provider comments below)

## 2018-05-01 NOTE — Telephone Encounter (Signed)
   Primary Cardiologist:Dr Waco 2 years ago  Chart reviewed as part of pre-operative protocol coverage. Because of Jennea Rager past medical history and time since last visit, he/she will require a follow-up visit in order to better assess preoperative cardiovascular risk.  Pre-op covering staff: - Please schedule appointment and call patient to inform them. - Please contact requesting surgeon's office via preferred method (i.e, phone, fax) to inform them of need for appointment prior to surgery.  If applicable, this message will also be routed to pharmacy pool and/or primary cardiologist for input on holding anticoagulant/antiplatelet agent as requested below so that this information is available at time of patient's appointment.   Kerin Ransom, PA-C  05/01/2018, 3:46 PM

## 2018-05-02 NOTE — Telephone Encounter (Signed)
APPT SCHEDULED 1-6 @930AM 

## 2018-05-02 NOTE — Telephone Encounter (Signed)
[id number] 

## 2018-05-02 NOTE — Telephone Encounter (Signed)
Follow up:    Patient retuning call, there are no appt avable and her surgry is on 05/13/18 and she needs a sooner appt.

## 2018-05-04 ENCOUNTER — Encounter: Payer: Self-pay | Admitting: Cardiology

## 2018-05-04 DIAGNOSIS — Z01818 Encounter for other preprocedural examination: Secondary | ICD-10-CM | POA: Insufficient documentation

## 2018-05-05 ENCOUNTER — Ambulatory Visit: Payer: BLUE CROSS/BLUE SHIELD | Admitting: Cardiology

## 2018-05-05 ENCOUNTER — Encounter: Payer: Self-pay | Admitting: Cardiology

## 2018-05-05 VITALS — BP 126/86 | HR 66 | Ht 66.5 in | Wt 166.0 lb

## 2018-05-05 DIAGNOSIS — R000 Tachycardia, unspecified: Secondary | ICD-10-CM | POA: Diagnosis not present

## 2018-05-05 DIAGNOSIS — G901 Familial dysautonomia [Riley-Day]: Secondary | ICD-10-CM | POA: Diagnosis not present

## 2018-05-05 DIAGNOSIS — Z01818 Encounter for other preprocedural examination: Secondary | ICD-10-CM

## 2018-05-05 DIAGNOSIS — I498 Other specified cardiac arrhythmias: Secondary | ICD-10-CM

## 2018-05-05 DIAGNOSIS — I951 Orthostatic hypotension: Secondary | ICD-10-CM

## 2018-05-05 DIAGNOSIS — G90A Postural orthostatic tachycardia syndrome (POTS): Secondary | ICD-10-CM

## 2018-05-05 NOTE — Assessment & Plan Note (Signed)
Chronic orthostatic symptoms though the patient currently handles this quite well.

## 2018-05-05 NOTE — Assessment & Plan Note (Signed)
Seen today for pre op clearance prior to Minerva procedure

## 2018-05-05 NOTE — Patient Instructions (Addendum)
Medication Instructions:  Your physician recommends that you continue on your current medications as directed. Please refer to the Current Medication list given to you today. If you need a refill on your cardiac medications before your next appointment, please call your pharmacy.   Lab work: None  If you have labs (blood work) drawn today and your tests are completely normal, you will receive your results only by: Marland Kitchen MyChart Message (if you have MyChart) OR . A paper copy in the mail If you have any lab test that is abnormal or we need to change your treatment, we will call you to review the results.  Testing/Procedures: None  Follow-Up: At [facility], you and your health needs are our priority.  As part of our continuing mission to provide you with exceptional heart care, we have created designated Provider Care Teams.  These Care Teams include your primary Cardiologist (physician) and Advanced Practice Providers (APPs -  Physician Assistants and Nurse Practitioners) who all work together to provide you with the care you need, when you need it. You will need a follow up appointment in:  12 months.  Please call our office 2 months(November) in advance to schedule this appointment.  You may see Dr Dorris Carnes or one of the following Advanced Practice Providers on your designated Care Team: Richardson Dopp, PA-C [place], [place] . Daune Perch, NP .   Any Other Special Instructions Will Be Listed Below (If Applicable). Patient has been cleared for upcoming procedure.

## 2018-05-05 NOTE — Progress Notes (Addendum)
05/05/2018 Chelsey Sanford   1975-10-31  [id number]  Primary Physician Quay Burow, Claudina Lick, MD Primary Cardiologist: Dr Harrington Challenger  HPI:  Pleasant 43 y/o female with a history dysautonomia and POTS.  She sees Dr. Harrington Challenger once a year.  She has managed her disease quite well.  She exercises daily.  She knows to stay hydrated.  She is not had syncope.  She tells me she can tell when she feels like her blood pressure is low and she is able to sit down or lay down.  She is in the office today for preoperative clearance.  She tells me in the past she is tolerated previous procedures and anesthesia as well.   Current Outpatient Medications  Medication Sig Dispense Refill  . Cyanocobalamin 500 MCG/0.1ML SOLN 1 spray in one nostril qwk (Patient taking differently: Place into the nose once a week. On Friday) 3 Bottle 3  . diphenhydrAMINE (BENADRYL) 25 MG tablet Take 50 mg by mouth every 6 (six) hours as needed for itching or allergies.    . Lactobacillus-Inulin (CULTURELLE DIGESTIVE HEALTH PO) Take 1 tablet by mouth daily.     . norethindrone (NORLYDA) 0.35 MG tablet Take 1 tablet by mouth daily.    . pantoprazole (PROTONIX) 40 MG tablet Take 1 tablet (40 mg total) by mouth daily. -- Office visit needed for further refills 30 tablet 0  . pindolol (VISKEN) 5 MG tablet Take 1 tablet (5 mg total) by mouth 2 (two) times daily. 180 tablet 1  . pyridostigmine (MESTINON) 60 MG tablet Take 1 tablet (60 mg total) by mouth 2 (two) times daily. Please make overdue appt with Dr. Harrington Challenger before anymore refills. 2nd attempt 180 tablet 1  . rizatriptan (MAXALT-MLT) 10 MG disintegrating tablet Take 1 tablet (10 mg total) by mouth as needed for migraine. May repeat in 2 hours if needed 9 tablet 2   No current facility-administered medications for this visit.     Allergies  Allergen Reactions  . Adhesive [Tape] Rash and Other (See Comments)    Skin blistered and disintegrated after epidural (looked like 3rd degree burn) - possibly due  to tape and/or tegaderm - do not use anything with adhesive  . Sulfonamide Derivatives Nausea And Vomiting  . Doxycycline Nausea And Vomiting  . Sulfa Antibiotics Nausea And Vomiting  . Amoxicillin Nausea And Vomiting and Rash  . Erythromycin Nausea And Vomiting and Rash  . Latex Rash    See notes under adhesive tape  . Penicillins Nausea And Vomiting and Rash    Has patient had a PCN reaction causing immediate rash, facial/tongue/throat swelling, SOB or lightheadedness with hypotension: Yes Has patient had a PCN reaction causing severe rash involving mucus membranes or skin necrosis: No Has patient had a PCN reaction that required hospitalization: No Has patient had a PCN reaction occurring within the last 10 years: No If all of the above answers are "NO", then may proceed with Cephalosporin use.    Past Medical History:  Diagnosis Date  . ALLERGIC RHINITIS   . ANEMIA-NOS   . Anxiety   . Delayed gastric emptying    dx at [facility] by gastric emptying study per patient  . DYSAUTONOMIA   . ENDOMETRIOSIS   . GERD (gastroesophageal reflux disease) 10/06/2011  . Headache(784.0)   . Irritable bowel syndrome   . Lumbar disc disease   . Migraine    since 6th grade  . POTS (postural orthostatic tachycardia syndrome)     Social History  Socioeconomic History  . Marital status: Married    Spouse name: Chelsey Sanford  . Number of children: 1  . Years of education: 61  . Highest education level: Not on file  Occupational History  . 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 Financial resource strain: Not on file  . Food insecurity:    Worry: Not on file    Inability: Not on file  . Transportation needs:    Medical: Not on file    Non-medical: Not on file  Tobacco Use  . Smoking status: Former Smoker    Last attempt to quit: 04/30/1996    Years since quitting: 22.0  . Smokeless tobacco: Never Used  Substance and  Sexual Activity  . Alcohol use: Yes    Comment: several times weekly  . Drug use: No  . Sexual activity: Yes    Birth control/protection: Pill  Lifestyle  . Physical activity:    Days per week: Not on file    Minutes per session: Not on file  . Stress: Not on file  Relationships  . Social connections:    Talks on phone: Not on file    Gets together: Not on file    Attends religious service: Not on file    Active member of club or organization: Not on file    Attends meetings of clubs or organizations: Not on file    Relationship status: Not on file  . Intimate partner violence:    Fear of current or ex partner: Not on file    Emotionally abused: Not on file    Physically abused: Not on file    Forced sexual activity: Not on file  Other Topics Concern  . 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 Thyroid disease Mother        hypothyroidism  . Migraines Mother   . Colon polyps Mother   . Skin cancer Mother        malignant melanoma  . Crohn's disease Maternal Grandmother   . Parkinson's disease Paternal Grandmother   . Arthritis/Rheumatoid Paternal Grandmother   . Cancer Paternal Grandfather        skin  . Parkinson's disease Paternal Grandfather   . COPD Neg Hx   . Colon cancer Neg Hx      Review of Systems: General: negative for chills, fever, night sweats or weight changes.  Cardiovascular: negative for chest pain, dyspnea on exertion, edema, orthopnea, palpitations, paroxysmal nocturnal dyspnea or shortness of breath Dermatological: negative for rash Respiratory: negative for cough or wheezing Urologic: negative for hematuria Abdominal: negative for nausea, vomiting, diarrhea, bright red blood per rectum,  melena, or hematemesis Neurologic: negative for visual changes, syncope, or dizziness All other systems reviewed and are otherwise negative except as noted above.    Blood pressure 126/86, pulse 66, height 5' 6.5" (1.689 m), weight 166 lb (75.3 kg).  General appearance: alert, cooperative and no distress Neck: no carotid bruit and no JVD Lungs: clear to auscultation bilaterally Extremities: no edema Skin: Skin color, texture, turgor normal. No rashes or lesions Neurologic: Grossly normal  EKG NSR-HR 66, Qin V2  ASSESSMENT AND PLAN:   Pre-operative clearance Seen today for pre op clearance prior to Minerva procedure   POTS (postural orthostatic tachycardia syndrome) Chronic orthostatic symptoms though the patient currently handles this quite well.  Dysautonomia I reviewed her case with Dr Claiborne Billings in the office today. Contniue current medications, avoid perioperative hypotension.    PLAN   Chart reviewed and patient seen and examined as part of pre-operative protocol coverage. , Based on ACC/AHA guidelines, Kamber Vignola would be at acceptable risk for the planned procedure without further cardiovascular testing.   I will route this recommendation to the requesting party via Epic fax function and remove from pre-op pool.  Please call with questions.  Kerin Ransom PA-C 05/05/2018 10:08 AM

## 2018-05-12 DIAGNOSIS — L42 Pityriasis rosea: Secondary | ICD-10-CM | POA: Diagnosis not present

## 2018-05-12 DIAGNOSIS — Z23 Encounter for immunization: Secondary | ICD-10-CM | POA: Diagnosis not present

## 2018-05-13 DIAGNOSIS — N920 Excessive and frequent menstruation with regular cycle: Secondary | ICD-10-CM | POA: Diagnosis not present

## 2018-05-26 NOTE — Progress Notes (Signed)
Subjective:    Patient ID: Chelsey Sanford, female    DOB: 1976/02/20, 43 y.o.   MRN: [id number]  HPI She is here for a physical exam.   She lost 1/3 of her hair this past year.  She has new hair growth, but it is slow and the texture is different.  She was unsure if this was related to her thyroid or something else.  She is happy that she is getting new hair growth, but it seems to be coming back slowly and she wonders what else she can do to help.  She is exercising regularly.  She would ideally like to lose a little bit more weight.  Medications and allergies reviewed with patient and updated if appropriate.  Patient Active Problem List   Diagnosis Date Noted  . Pre-operative clearance 05/04/2018  . Hyperthyroidism 12/09/2017  . Photodermatitis due to sun 07/24/2017  . Cold thyroid nodule 04/05/2017  . Graves' disease 03/07/2017  . Fatigue 12/14/2016  . 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 Photosensitivity 09/04/2016  . Vitamin D deficiency 08/13/2016  . Vomiting 07/31/2016  . Diarrhea 07/31/2016  . 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 Dysautonomia ([place]) 05/16/2015  . B12 deficiency 05/16/2015  . Superficial phlebitis 04/29/2013  . Contact dermatitis 09/03/2012  . Migraine headache   . GERD (gastroesophageal reflux disease) 10/06/2011  . Vertigo 10/05/2011  . Anxiety state 05/03/2010  . ALLERGIC RHINITIS 05/03/2010  . ANEMIA-NOS 12/26/2007  . POTS (postural orthostatic tachycardia syndrome) 12/26/2007  . Irritable bowel syndrome 09/08/2007  . ENDOMETRIOSIS 07/21/2007    Current Outpatient Medications on File Prior to Visit  Medication Sig Dispense Refill  . Cyanocobalamin 500 MCG/0.1ML SOLN 1 spray in one nostril qwk (Patient taking differently: Place into the nose once a week. On Friday) 3 Bottle 3  . diphenhydrAMINE (BENADRYL) 25 MG tablet Take 50 mg by mouth every 6 (six) hours as needed for itching or allergies.    .  Lactobacillus-Inulin (CULTURELLE DIGESTIVE HEALTH PO) Take 1 tablet by mouth daily.     . norethindrone (NORLYDA) 0.35 MG tablet Take 1 tablet by mouth daily.    . pantoprazole (PROTONIX) 40 MG tablet Take 1 tablet (40 mg total) by mouth daily. -- Office visit needed for further refills 30 tablet 0  . pindolol (VISKEN) 5 MG tablet Take 1 tablet (5 mg total) by mouth 2 (two) times daily. 180 tablet 1  . pyridostigmine (MESTINON) 60 MG tablet Take 1 tablet (60 mg total) by mouth 2 (two) times daily. Please make overdue appt with Dr. Harrington Challenger before anymore refills. 2nd attempt 180 tablet 1  . rizatriptan (MAXALT-MLT) 10 MG disintegrating tablet Take 1 tablet (10 mg total) by mouth as needed for migraine. May repeat in 2 hours if needed 9 tablet 2  . [DISCONTINUED] magnesium oxide (MAG-OX) 400 MG tablet Take 1 tablet (400 mg total) by mouth daily. 30 tablet 5   No current facility-administered medications on file prior to visit.     Past Medical History:  Diagnosis Date  . ALLERGIC RHINITIS   . ANEMIA-NOS   . Anxiety   . Delayed gastric emptying    dx at [facility] by gastric emptying study per patient  . DYSAUTONOMIA   . ENDOMETRIOSIS   . GERD (gastroesophageal reflux disease) 10/06/2011  . Headache(784.0)   . Irritable bowel syndrome   . Lumbar disc disease   . Migraine    since 6th grade  . POTS (postural orthostatic tachycardia  syndrome)     Past Surgical History:  Procedure Laterality Date  . ANKLE RECONSTRUCTION  1993  . ELBOW SURGERY Right 2015  . TONSILLECTOMY  1996  . WISDOM TOOTH EXTRACTION      Social History   Socioeconomic History  . Marital status: Married    Spouse name: [name]  . Number of children: 1  . Years of education: 10  . Highest education level: Not on file  Occupational History  . 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 Financial resource strain: Not on file  . Food insecurity:      Worry: Not on file    Inability: Not on file  . Transportation needs:    Medical: Not on file    Non-medical: Not on file  Tobacco Use  . Smoking status: Former Smoker    Last attempt to quit: 04/30/1996    Years since quitting: 22.0  . Smokeless tobacco: Never Used  Substance and Sexual Activity  . Alcohol use: Yes    Comment: several times weekly  . Drug use: No  . Sexual activity: Yes    Birth control/protection: Pill  Lifestyle  . Physical activity:    Days per week: Not on file    Minutes per session: Not on file  . Stress: Not on file  Relationships  . Social connections:    Talks on phone: Not on file    Gets together: Not on file    Attends religious service: Not on file    Active member of club or organization: Not on file    Attends meetings of clubs or organizations: Not on file    Relationship status: Not on file  Other Topics Concern  . 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 Thyroid disease Mother        hypothyroidism  . Migraines Mother   . Colon polyps Mother   . Skin cancer Mother        malignant melanoma  . Crohn's disease Maternal Grandmother   . Parkinson's disease Paternal Grandmother   . Arthritis/Rheumatoid Paternal Grandmother   . Cancer Paternal Grandfather        skin  . Parkinson's disease Paternal Grandfather   . COPD Neg Hx   . Colon cancer Neg Hx     Review of Systems  Constitutional: Positive for fatigue ( ). Negative for chills and fever.  Eyes: Negative for visual disturbance.  Respiratory: Negative for cough, shortness of breath and wheezing.   Cardiovascular: Positive for palpitations (chronic no change). Negative for chest pain and leg swelling.  Gastrointestinal:  Negative for abdominal pain, blood in stool, constipation, diarrhea and nausea.  Genitourinary: Negative for dysuria and hematuria.  Musculoskeletal: Positive for arthralgias. Negative for back pain.  Skin: Negative for color change and rash.       Hair change  Neurological: Negative for light-headedness and headaches.  Psychiatric/Behavioral: Negative for dysphoric mood. The patient is not nervous/anxious.        Objective:   Vitals:   05/27/18 0938  BP: 112/80  Pulse: 62  Resp: 16  Temp: 98.3 F (36.8 C)  SpO2: 97%   Filed Weights   05/27/18 0938  Weight: 167 lb (75.8 kg)   Body mass index is 26.55 kg/m.  BP Readings from Last 3 Encounters:  05/27/18 112/80  05/05/18 126/86  07/24/17 122/84    Wt Readings from Last 3 Encounters:  05/27/18 167 lb (75.8 kg)  05/05/18 166 lb (75.3 kg)  07/24/17 171 lb (77.6 kg)     Physical Exam Constitutional: She appears well-developed and well-nourished. No distress.  HENT:  Head: Normocephalic and atraumatic.  Right Ear: External ear normal. Normal ear canal and TM Left Ear: External ear normal.  Normal ear canal and TM Mouth/Throat: Oropharynx is clear and moist.  Eyes: Conjunctivae and EOM are normal.  Neck: Neck supple. No tracheal deviation present. No thyromegaly present.  No carotid bruit  Cardiovascular: Normal rate, regular rhythm and normal heart sounds.   No murmur heard.  No edema. Pulmonary/Chest: Effort normal and breath sounds normal. No respiratory distress. She has no wheezes. She has no rales.  Breast: deferred to Gyn Abdominal: Soft. She exhibits no distension. There is no tenderness.  Lymphadenopathy: She has no cervical adenopathy.  Skin: Skin is warm and dry. She is not diaphoretic.  Psychiatric: She has a normal mood and affect. Her behavior is normal.        Assessment & Plan:   Physical exam: Screening blood work  ordered Immunizations   Up to date  Mammogram   Up to date  Gyn     Up to  date  Eye exams  Up to date  Exercise  regular - work out daily Weight  Weight good - working weight loss Skin hair loss, no other concerns-sees dermatology Substance abuse  none  See Problem List for Assessment and Plan of chronic medical problems.  Follow-up annually

## 2018-05-26 NOTE — Patient Instructions (Addendum)
Tests ordered today. Your results will be released to MyChart (or called to you) after review, usually within 72hours after test completion. If any changes need to be made, you will be notified at that same time.  All other Health Maintenance issues reviewed.   All recommended immunizations and age-appropriate screenings are up-to-date or discussed.  No immunizations administered today.   Medications reviewed and updated.  Changes include :   none  Your prescription(s) have been submitted to your pharmacy. Please take as directed and contact our office if you believe you are having problem(s) with the medication(s).   Please followup in one year   Health Maintenance, Female Adopting a healthy lifestyle and getting preventive care can go a long way to promote health and wellness. Talk with your health care provider about what schedule of regular examinations is right for you. This is a good chance for you to check in with your provider about disease prevention and staying healthy. In between checkups, there are plenty of things you can do on your own. Experts have done a lot of research about which lifestyle changes and preventive measures are most likely to keep you healthy. Ask your health care provider for more information. Weight and diet Eat a healthy diet  Be sure to include plenty of vegetables, fruits, low-fat dairy products, and lean protein.  Do not eat a lot of foods high in solid fats, added sugars, or salt.  Get regular exercise. This is one of the most important things you can do for your health. ? Most adults should exercise for at least 150 minutes each week. The exercise should increase your heart rate and make you sweat (moderate-intensity exercise). ? Most adults should also do strengthening exercises at least twice a week. This is in addition to the moderate-intensity exercise. Maintain a healthy weight  Body mass index (BMI) is a measurement that can be used to  identify possible weight problems. It estimates body fat based on height and weight. Your health care provider can help determine your BMI and help you achieve or maintain a healthy weight.  For females 20 years of age and older: ? A BMI below 18.5 is considered underweight. ? A BMI of 18.5 to 24.9 is normal. ? A BMI of 25 to 29.9 is considered overweight. ? A BMI of 30 and above is considered obese. Watch levels of cholesterol and blood lipids  You should start having your blood tested for lipids and cholesterol at 44 years of age, then have this test every 5 years.  You may need to have your cholesterol levels checked more often if: ? Your lipid or cholesterol levels are high. ? You are older than 44 years of age. ? You are at high risk for heart disease. Cancer screening Lung Cancer  Lung cancer screening is recommended for adults 55-80 years old who are at high risk for lung cancer because of a history of smoking.  A yearly low-dose CT scan of the lungs is recommended for people who: ? Currently smoke. ? Have quit within the past 15 years. ? Have at least a 30-pack-year history of smoking. A pack year is smoking an average of one pack of cigarettes a day for 1 year.  Yearly screening should continue until it has been 15 years since you quit.  Yearly screening should stop if you develop a health problem that would prevent you from having lung cancer treatment. Breast Cancer  Practice breast self-awareness. This means understanding how   your breasts normally appear and feel.  It also means doing regular breast self-exams. Let your health care provider know about any changes, no matter how small.  If you are in your 20s or 30s, you should have a clinical breast exam (CBE) by a health care provider every 1-3 years as part of a regular health exam.  If you are 40 or older, have a CBE every year. Also consider having a breast X-ray (mammogram) every year.  If you have a family  history of breast cancer, talk to your health care provider about genetic screening.  If you are at high risk for breast cancer, talk to your health care provider about having an MRI and a mammogram every year.  Breast cancer gene (BRCA) assessment is recommended for women who have family members with BRCA-related cancers. BRCA-related cancers include: ? Breast. ? Ovarian. ? Tubal. ? Peritoneal cancers.  Results of the assessment will determine the need for genetic counseling and BRCA1 and BRCA2 testing. Cervical Cancer Your health care provider may recommend that you be screened regularly for cancer of the pelvic organs (ovaries, uterus, and vagina). This screening involves a pelvic examination, including checking for microscopic changes to the surface of your cervix (Pap test). You may be encouraged to have this screening done every 3 years, beginning at age 21.  For women ages 30-65, health care providers may recommend pelvic exams and Pap testing every 3 years, or they may recommend the Pap and pelvic exam, combined with testing for human papilloma virus (HPV), every 5 years. Some types of HPV increase your risk of cervical cancer. Testing for HPV may also be done on women of any age with unclear Pap test results.  Other health care providers may not recommend any screening for nonpregnant women who are considered low risk for pelvic cancer and who do not have symptoms. Ask your health care provider if a screening pelvic exam is right for you.  If you have had past treatment for cervical cancer or a condition that could lead to cancer, you need Pap tests and screening for cancer for at least 20 years after your treatment. If Pap tests have been discontinued, your risk factors (such as having a new sexual partner) need to be reassessed to determine if screening should resume. Some women have medical problems that increase the chance of getting cervical cancer. In these cases, your health care  provider may recommend more frequent screening and Pap tests. Colorectal Cancer  This type of cancer can be detected and often prevented.  Routine colorectal cancer screening usually begins at 44 years of age and continues through 44 years of age.  Your health care provider may recommend screening at an earlier age if you have risk factors for colon cancer.  Your health care provider may also recommend using home test kits to check for hidden blood in the stool.  A small camera at the end of a tube can be used to examine your colon directly (sigmoidoscopy or colonoscopy). This is done to check for the earliest forms of colorectal cancer.  Routine screening usually begins at age 50.  Direct examination of the colon should be repeated every 5-10 years through 44 years of age. However, you may need to be screened more often if early forms of precancerous polyps or small growths are found. Skin Cancer  Check your skin from head to toe regularly.  Tell your health care provider about any new moles or changes in moles, especially   if there is a change in a mole's shape or color.  Also tell your health care provider if you have a mole that is larger than the size of a pencil eraser.  Always use sunscreen. Apply sunscreen liberally and repeatedly throughout the day.  Protect yourself by wearing long sleeves, pants, a wide-brimmed hat, and sunglasses whenever you are outside. Heart disease, diabetes, and high blood pressure  High blood pressure causes heart disease and increases the risk of stroke. High blood pressure is more likely to develop in: ? People who have blood pressure in the high end of the normal range (130-139/85-89 mm Hg). ? People who are overweight or obese. ? People who are African American.  If you are 45-80 years of age, have your blood pressure checked every 3-5 years. If you are 75 years of age or older, have your blood pressure checked every year. You should have your  blood pressure measured twice-once when you are at a hospital or clinic, and once when you are not at a hospital or clinic. Record the average of the two measurements. To check your blood pressure when you are not at a hospital or clinic, you can use: ? An automated blood pressure machine at a pharmacy. ? A home blood pressure monitor.  If you are between 54 years and 22 years old, ask your health care provider if you should take aspirin to prevent strokes.  Have regular diabetes screenings. This involves taking a blood sample to check your fasting blood sugar level. ? If you are at a normal weight and have a low risk for diabetes, have this test once every three years after 44 years of age. ? If you are overweight and have a high risk for diabetes, consider being tested at a younger age or more often. Preventing infection Hepatitis B  If you have a higher risk for hepatitis B, you should be screened for this virus. You are considered at high risk for hepatitis B if: ? You were born in a country where hepatitis B is common. Ask your health care provider which countries are considered high risk. ? Your parents were born in a high-risk country, and you have not been immunized against hepatitis B (hepatitis B vaccine). ? You have HIV or AIDS. ? You use needles to inject street drugs. ? You live with someone who has hepatitis B. ? You have had sex with someone who has hepatitis B. ? You get hemodialysis treatment. ? You take certain medicines for conditions, including cancer, organ transplantation, and autoimmune conditions. Hepatitis C  Blood testing is recommended for: ? Everyone born from [birth year] through 1966. ? Anyone with known risk factors for hepatitis C. Sexually transmitted infections (STIs)  You should be screened for sexually transmitted infections (STIs) including gonorrhea and chlamydia if: ? You are sexually active and are younger than 44 years of age. ? You are older than 44  years of age and your health care provider tells you that you are at risk for this type of infection. ? Your sexual activity has changed since you were last screened and you are at an increased risk for chlamydia or gonorrhea. Ask your health care provider if you are at risk.  If you do not have HIV, but are at risk, it may be recommended that you take a prescription medicine daily to prevent HIV infection. This is called pre-exposure prophylaxis (PrEP). You are considered at risk if: ? You are sexually active and do not  regularly use condoms or know the HIV status of your partner(s). ? You take drugs by injection. ? You are sexually active with a partner who has HIV. Talk with your health care provider about whether you are at high risk of being infected with HIV. If you choose to begin PrEP, you should first be tested for HIV. You should then be tested every 3 months for as long as you are taking PrEP. Pregnancy  If you are premenopausal and you may become pregnant, ask your health care provider about preconception counseling.  If you may become pregnant, take 400 to 800 micrograms (mcg) of folic acid every day.  If you want to prevent pregnancy, talk to your health care provider about birth control (contraception). Osteoporosis and menopause  Osteoporosis is a disease in which the bones lose minerals and strength with aging. This can result in serious bone fractures. Your risk for osteoporosis can be identified using a bone density scan.  If you are 65 years of age or older, or if you are at risk for osteoporosis and fractures, ask your health care provider if you should be screened.  Ask your health care provider whether you should take a calcium or vitamin D supplement to lower your risk for osteoporosis.  Menopause may have certain physical symptoms and risks.  Hormone replacement therapy may reduce some of these symptoms and risks. Talk to your health care provider about whether  hormone replacement therapy is right for you. Follow these instructions at home:  Schedule regular health, dental, and eye exams.  Stay current with your immunizations.  Do not use any tobacco products including cigarettes, chewing tobacco, or electronic cigarettes.  If you are pregnant, do not drink alcohol.  If you are breastfeeding, limit how much and how often you drink alcohol.  Limit alcohol intake to no more than 1 drink per day for nonpregnant women. One drink equals 12 ounces of beer, 5 ounces of wine, or 1 ounces of hard liquor.  Do not use street drugs.  Do not share needles.  Ask your health care provider for help if you need support or information about quitting drugs.  Tell your health care provider if you often feel depressed.  Tell your health care provider if you have ever been abused or do not feel safe at home. This information is not intended to replace advice given to you by your health care provider. Make sure you discuss any questions you have with your health care provider. Document Released: 10/30/2010 Document Revised: 09/22/2015 Document Reviewed: 01/18/2015 Elsevier Interactive Patient Education  2019 [employer]  

## 2018-05-27 ENCOUNTER — Ambulatory Visit (INDEPENDENT_AMBULATORY_CARE_PROVIDER_SITE_OTHER): Payer: BLUE CROSS/BLUE SHIELD | Admitting: Internal Medicine

## 2018-05-27 ENCOUNTER — Encounter: Payer: Self-pay | Admitting: Internal Medicine

## 2018-05-27 ENCOUNTER — Other Ambulatory Visit (INDEPENDENT_AMBULATORY_CARE_PROVIDER_SITE_OTHER): Payer: BLUE CROSS/BLUE SHIELD

## 2018-05-27 VITALS — BP 112/80 | HR 62 | Temp 98.3°F | Resp 16 | Ht 66.5 in | Wt 167.0 lb

## 2018-05-27 DIAGNOSIS — E559 Vitamin D deficiency, unspecified: Secondary | ICD-10-CM

## 2018-05-27 DIAGNOSIS — G43809 Other migraine, not intractable, without status migrainosus: Secondary | ICD-10-CM | POA: Diagnosis not present

## 2018-05-27 DIAGNOSIS — E538 Deficiency of other specified B group vitamins: Secondary | ICD-10-CM

## 2018-05-27 DIAGNOSIS — K219 Gastro-esophageal reflux disease without esophagitis: Secondary | ICD-10-CM

## 2018-05-27 DIAGNOSIS — Z0000 Encounter for general adult medical examination without abnormal findings: Secondary | ICD-10-CM

## 2018-05-27 LAB — CBC WITH DIFFERENTIAL/PLATELET
Basophils Absolute: 0.1 10*3/uL (ref 0.0–0.1)
Basophils Relative: 0.9 % (ref 0.0–3.0)
Eosinophils Absolute: 0.1 10*3/uL (ref 0.0–0.7)
Eosinophils Relative: 1.6 % (ref 0.0–5.0)
HCT: 41.6 % (ref 36.0–46.0)
Hemoglobin: 14 g/dL (ref 12.0–15.0)
Lymphocytes Relative: 32.8 % (ref 12.0–46.0)
Lymphs Abs: 2.7 10*3/uL (ref 0.7–4.0)
MCHC: 33.5 g/dL (ref 30.0–36.0)
MCV: 84.8 fl (ref 78.0–100.0)
Monocytes Absolute: 0.5 10*3/uL (ref 0.1–1.0)
Monocytes Relative: 6.5 % (ref 3.0–12.0)
Neutro Abs: 4.8 10*3/uL (ref 1.4–7.7)
Neutrophils Relative %: 58.2 % (ref 43.0–77.0)
PLATELETS: 264 10*3/uL (ref 150.0–400.0)
RBC: 4.9 Mil/uL (ref 3.87–5.11)
RDW: 13 % (ref 11.5–15.5)
WBC: 8.2 10*3/uL (ref 4.0–10.5)

## 2018-05-27 LAB — COMPREHENSIVE METABOLIC PANEL
ALT: 10 U/L (ref 0–35)
AST: 14 U/L (ref 0–37)
Albumin: 4.5 g/dL (ref 3.5–5.2)
Alkaline Phosphatase: 51 U/L (ref 39–117)
BUN: 10 mg/dL (ref 6–23)
CO2: 28 mEq/L (ref 19–32)
Calcium: 9.6 mg/dL (ref 8.4–10.5)
Chloride: 101 mEq/L (ref 96–112)
Creatinine, Ser: 0.76 mg/dL (ref 0.40–1.20)
GFR: 82.83 mL/min (ref >60.00)
GLUCOSE: 100 mg/dL — AB (ref 70–99)
POTASSIUM: 4.2 meq/L (ref 3.5–5.1)
Sodium: 137 mEq/L (ref 135–145)
Total Bilirubin: 0.7 mg/dL (ref 0.2–1.2)
Total Protein: 7.2 g/dL (ref 6.0–8.3)

## 2018-05-27 LAB — LIPID PANEL
Cholesterol: 227 mg/dL — ABNORMAL HIGH (ref 0–200)
HDL: 57.4 mg/dL (ref >39.00)
LDL CALC: 146 mg/dL — AB (ref 0–99)
NonHDL: 170.05
Total CHOL/HDL Ratio: 4
Triglycerides: 121 mg/dL (ref 0.0–149.0)
VLDL: 24.2 mg/dL (ref 0.0–40.0)

## 2018-05-27 LAB — VITAMIN D 25 HYDROXY (VIT D DEFICIENCY, FRACTURES): VITD: 30.53 ng/mL (ref 30.00–100.00)

## 2018-05-27 LAB — VITAMIN B12: VITAMIN B 12: 515 pg/mL (ref 211–911)

## 2018-05-27 MED ORDER — PANTOPRAZOLE SODIUM 40 MG PO TBEC: 40.0000 mg | DELAYED_RELEASE_TABLET | Freq: Every day | ORAL | 3 refills | Status: DC

## 2018-05-27 NOTE — Assessment & Plan Note (Signed)
Takes vitamin d gummy daily Check level

## 2018-05-27 NOTE — Assessment & Plan Note (Addendum)
Migraines have improved, no routine headaches Maxalt as needed

## 2018-05-27 NOTE — Assessment & Plan Note (Signed)
Doing weekly nose spray Check level

## 2018-05-27 NOTE — Assessment & Plan Note (Signed)
Has distal esophageal paralysis and dysautonomia Overall GERD controlled with pantoprazole Continue daily

## 2018-05-28 ENCOUNTER — Other Ambulatory Visit: Payer: Self-pay | Admitting: Internal Medicine

## 2018-05-29 ENCOUNTER — Encounter: Payer: Self-pay | Admitting: Internal Medicine

## 2018-07-09 ENCOUNTER — Encounter: Payer: Self-pay | Admitting: Internal Medicine

## 2018-07-09 ENCOUNTER — Other Ambulatory Visit: Payer: Self-pay

## 2018-07-09 ENCOUNTER — Telehealth: Payer: Self-pay | Admitting: Internal Medicine

## 2018-07-09 MED ORDER — CYANOCOBALAMIN 500 MCG/0.1ML NA SOLN: NASAL | 3 refills | Status: DC

## 2018-07-09 NOTE — Telephone Encounter (Signed)
Patient stated that her insurance would like her to get 90 day supply of her RX's due to it being cheaper.  Please Advise, thanks

## 2018-07-09 NOTE — Telephone Encounter (Signed)
Which med?

## 2018-07-09 NOTE — Telephone Encounter (Signed)
Please advise on refill, last OV was a year ago and was suppose to follow up 6 months from last visit. Patient does not have OV scheduled.

## 2018-07-10 NOTE — Telephone Encounter (Signed)
The thyroid meds.

## 2018-07-10 NOTE — Telephone Encounter (Signed)
OK to refill for 3 mo >> needs appt before next refill

## 2018-07-11 MED ORDER — METHIMAZOLE 5 MG PO TABS: ORAL_TABLET | ORAL | 0 refills | Status: DC

## 2018-07-11 NOTE — Addendum Note (Signed)
Addended by: Cardell Peach I on: 07/11/2018 01:39 PM   Modules accepted: Orders

## 2018-07-11 NOTE — Telephone Encounter (Signed)
RX sent for 90 days with 0 refills until patient is seen.

## 2018-07-16 ENCOUNTER — Other Ambulatory Visit: Payer: Self-pay | Admitting: Internal Medicine

## 2018-10-19 ENCOUNTER — Other Ambulatory Visit: Payer: Self-pay | Admitting: Internal Medicine

## 2018-11-19 ENCOUNTER — Other Ambulatory Visit: Payer: Self-pay

## 2018-11-19 DIAGNOSIS — Z20822 Contact with and (suspected) exposure to covid-19: Secondary | ICD-10-CM

## 2018-11-19 DIAGNOSIS — R6889 Other general symptoms and signs: Secondary | ICD-10-CM | POA: Diagnosis not present

## 2018-11-20 DIAGNOSIS — Z20828 Contact with and (suspected) exposure to other viral communicable diseases: Secondary | ICD-10-CM | POA: Diagnosis not present

## 2018-11-23 LAB — NOVEL CORONAVIRUS, NAA: SARS-CoV-2, NAA: NOT DETECTED

## 2018-11-23 LAB — SPECIMEN STATUS REPORT

## 2018-12-25 ENCOUNTER — Other Ambulatory Visit: Payer: Self-pay | Admitting: Internal Medicine

## 2019-02-06 ENCOUNTER — Other Ambulatory Visit: Payer: Self-pay | Admitting: Internal Medicine

## 2019-02-06 ENCOUNTER — Encounter: Payer: Self-pay | Admitting: Internal Medicine

## 2019-02-06 DIAGNOSIS — N644 Mastodynia: Secondary | ICD-10-CM

## 2019-02-06 DIAGNOSIS — Z1231 Encounter for screening mammogram for malignant neoplasm of breast: Secondary | ICD-10-CM

## 2019-02-11 ENCOUNTER — Other Ambulatory Visit: Payer: Self-pay

## 2019-02-11 ENCOUNTER — Ambulatory Visit
Admission: RE | Admit: 2019-02-11 | Discharge: 2019-02-11 | Disposition: A | Discharge status: Home / Self Care | Payer: BLUE CROSS/BLUE SHIELD | Source: Ambulatory Visit | Attending: Internal Medicine | Admitting: Internal Medicine

## 2019-02-11 ENCOUNTER — Ambulatory Visit: Payer: BLUE CROSS/BLUE SHIELD

## 2019-02-11 DIAGNOSIS — N644 Mastodynia: Secondary | ICD-10-CM

## 2019-02-11 DIAGNOSIS — R922 Inconclusive mammogram: Secondary | ICD-10-CM | POA: Diagnosis not present

## 2019-02-20 DIAGNOSIS — Z808 Family history of malignant neoplasm of other organs or systems: Secondary | ICD-10-CM | POA: Diagnosis not present

## 2019-02-20 DIAGNOSIS — Z23 Encounter for immunization: Secondary | ICD-10-CM | POA: Diagnosis not present

## 2019-02-20 DIAGNOSIS — D2261 Melanocytic nevi of right upper limb, including shoulder: Secondary | ICD-10-CM | POA: Diagnosis not present

## 2019-02-20 DIAGNOSIS — D485 Neoplasm of uncertain behavior of skin: Secondary | ICD-10-CM | POA: Diagnosis not present

## 2019-02-20 DIAGNOSIS — D2272 Melanocytic nevi of left lower limb, including hip: Secondary | ICD-10-CM | POA: Diagnosis not present

## 2019-02-20 DIAGNOSIS — D225 Melanocytic nevi of trunk: Secondary | ICD-10-CM | POA: Diagnosis not present

## 2019-02-24 ENCOUNTER — Other Ambulatory Visit: Payer: Self-pay | Admitting: Internal Medicine

## 2019-02-24 NOTE — Telephone Encounter (Signed)
Last office visit 07/15/17  Cancel/No-show? one  Future office visit scheduled? no  Please advise on refill.

## 2019-02-24 NOTE — Telephone Encounter (Signed)
We advised him to schedule an appointment for a long time now.  She will need to get further refills per PCP.

## 2019-02-25 ENCOUNTER — Telehealth: Payer: Self-pay | Admitting: Internal Medicine

## 2019-02-25 ENCOUNTER — Other Ambulatory Visit: Payer: Self-pay

## 2019-02-25 MED ORDER — METHIMAZOLE 5 MG PO TABS: ORAL_TABLET | ORAL | 0 refills | Status: DC

## 2019-02-25 NOTE — Telephone Encounter (Signed)
MEDICATION: Methimazole  PHARMACY:  [facility] on [address]  IS THIS A 90 DAY SUPPLY : no  IS PATIENT OUT OF MEDICATION: yes  IF NOT; HOW MUCH IS LEFT:   LAST APPOINTMENT DATE: @ 01/06/2018  NEXT APPOINTMENT DATE:@10 /30/2020  DO WE HAVE YOUR PERMISSION TO LEAVE A DETAILED MESSAGE: yes  OTHER COMMENTS: I did get her scheduled for a follow up visit on this coming Friday 02/27/2019 since it had been since 2019   **Let patient know to contact pharmacy at the end of the day to make sure medication is ready. **  ** Please notify patient to allow 48-72 hours to process**  **Encourage patient to contact the pharmacy for refills or they can request refills through [facility]**

## 2019-02-25 NOTE — Telephone Encounter (Signed)
Okay to call in a 30-day supply of methimazole if she is completely out.

## 2019-02-25 NOTE — Telephone Encounter (Signed)
Refill yesterday was denied due to patient not following up since March 2019 and directed back to PCP.  Patient has called in for refill and I now see she made appt this week.  Please advise if you are still willing to see patient and also on refill.

## 2019-02-25 NOTE — Telephone Encounter (Signed)
One time fill for a 30 day sent. Further refills will require patient to follow up.

## 2019-02-27 ENCOUNTER — Other Ambulatory Visit: Payer: Self-pay

## 2019-02-27 ENCOUNTER — Ambulatory Visit: Payer: BC Managed Care – PPO | Admitting: Internal Medicine

## 2019-02-27 ENCOUNTER — Encounter: Payer: Self-pay | Admitting: Internal Medicine

## 2019-02-27 VITALS — BP 128/74 | HR 84 | Ht 66.5 in | Wt 178.2 lb

## 2019-02-27 DIAGNOSIS — E538 Deficiency of other specified B group vitamins: Secondary | ICD-10-CM

## 2019-02-27 DIAGNOSIS — E0500 Thyrotoxicosis with diffuse goiter without thyrotoxic crisis or storm: Secondary | ICD-10-CM | POA: Diagnosis not present

## 2019-02-27 DIAGNOSIS — E041 Nontoxic single thyroid nodule: Secondary | ICD-10-CM | POA: Diagnosis not present

## 2019-02-27 LAB — T3, FREE: T3, Free: 3.3 pg/mL (ref 2.3–4.2)

## 2019-02-27 LAB — TSH: TSH: 2.58 u[IU]/mL (ref 0.35–4.50)

## 2019-02-27 LAB — T4, FREE: Free T4: 0.87 ng/dL (ref 0.60–1.60)

## 2019-02-27 MED ORDER — METHIMAZOLE 5 MG PO TABS: ORAL_TABLET | ORAL | 5 refills | Status: DC

## 2019-02-27 NOTE — Patient Instructions (Addendum)
Please restart: - Methimazole 5 mg daily  Please come back for a follow-up appointment in 1 year but for labs in 6 months.

## 2019-02-27 NOTE — Progress Notes (Addendum)
Patient ID: Chelsey Sanford, female   DOB: [birth year], 43 y.o.   MRN: [facility]:[id number]    HPI  Chelsey Sanford is a 43 y.o.-year-old female, returning for follow-up for subclinical thyrotoxicosis (likely Graves' disease), also, vitamin D and B12 deficiencies.  Last visit 1 year and 7 months ago.  Reviewed and addended history: Patient was found to have a low TSH on her annual physical exam in 11/2016. She was getting annual TFTs until then due to a family history of hypothyroidism in her mother.  The rest of her TSH levels have been normal since 2008.  Her thyroid tests initially improved in 02/2017, but the latest set of tests worsened again (subclinical thyrotoxicosis).  In 05/2017, we started methimazole 5 mg daily.  She is starting to feel little better.  However, she was lost for follow-up afterwards.  She is now off Pisinemo for 3 days (ran out).   She is on Hair Skin and Nails vitamin  - not in the last 2 days.   Reviewed her TFTs: Lab Results  Component Value Date   TSH 3.02 12/24/2017   TSH 0.18 (L) 07/24/2017   TSH 0.01 (L) 06/19/2017   TSH 0.28 (L) 03/07/2017   TSH 0.01 (L) 12/14/2016   TSH 2.03 09/07/2015   TSH 1.84 05/16/2015   TSH 2.57 07/16/2014   TSH 2.50 06/18/2013   TSH 2.85 01/14/2007   FREET4 0.72 12/24/2017   FREET4 0.86 07/24/2017   FREET4 1.20 06/19/2017   FREET4 0.82 03/07/2017   FREET4 0.86 12/26/2016   FREET4 0.80 06/18/2013   T3FREE 3.5 12/24/2017   T3FREE 3.2 07/24/2017   T3FREE 5.2 (H) 06/19/2017   T3FREE 3.5 03/07/2017   T3FREE 3.8 12/26/2016   Her TPO antibodies were elevated: Component     Latest Ref Rng & Units 12/26/2016  Thyroperoxidase Ab SerPl-aCnc     <9 IU/mL 129 (H)  Thyroglobulin Ab     <2 IU/mL 1   Her Graves' antibodies were elevated: Lab Results  Component Value Date   TSI 441 (H) 03/07/2017   We obtained a thyroid uptake and scan (04/03/2017): Uniform scan with the exception of a small right cold nodule, uptake at the upper limit of normal,  29%.  This was consistent with possible mild Graves' disease.  We then checked a thyroid ultrasound (04/15/2017): Small, 1.2 cm isoechoic solid nodule in the right lobe.  Also, small, subcentimeter echogenic left nodules.  None of the nodules meet criteria for biopsy or follow-up.  Pt denies: - feeling nodules in neck - hoarseness - + dysphagia -feels food stuck in her throat lately - choking - SOB with lying down  Pt does have a FH of thyroid ds. In Mother >> hypothyroidism. No FH of thyroid cancer. No h/o radiation tx to head or neck.  No seaweed or kelp. No recent contrast studies. No herbal supplements. No Biotin use. No recent steroids use.   Pt. also has a history of POTS dx 2008 - managed by cardiology.  Was on Florinef and Mestinon >> she is now off the Florinef. On Pindolol. She also has B12 deficiency and vitamin D deficiency. She has longstanding anemia. Also, eosinophilic esophagitis and hiatal hernia >> on Protonix.  She does yoga and walks with her dogs daily.  ROS: Constitutional: + weight gain, + fatigue, no heat or cold intolerance Eyes: no blurry vision, no xerophthalmia ENT: no sore throat, + see HPI Cardiovascular: no CP/no SOB/+ palpitations/no leg swelling Respiratory: no cough/no SOB/no wheezing Gastrointestinal:  no N/no V/no D/no C/no acid reflux Musculoskeletal: + muscle aches/+ joint aches Skin: no rashes, + hair loss Neurological: no tremors/no numbness/no tingling/no dizziness  I reviewed pt's medications, allergies, PMH, social hx, family hx, and changes were documented in the history of present illness. Otherwise, unchanged from my initial visit note.  Past Medical History:  Diagnosis Date  . ALLERGIC RHINITIS   . ANEMIA-NOS   . Anxiety   . Delayed gastric emptying    dx at [facility] by gastric emptying study per patient  . DYSAUTONOMIA   . ENDOMETRIOSIS   . GERD (gastroesophageal reflux disease) 10/06/2011  . Headache(784.0)   . Irritable bowel  syndrome   . Lumbar disc disease   . Migraine    since 6th grade  . POTS (postural orthostatic tachycardia syndrome)    Past Surgical History:  Procedure Laterality Date  . ANKLE RECONSTRUCTION  1993  . ELBOW SURGERY Right 2015  . TONSILLECTOMY  1996  . WISDOM TOOTH EXTRACTION     Social History   Socioeconomic History  . Marital status: Married    Spouse name: [name]  . Number of children: 1  . Years of education: 47  . Highest education level: Not on file  Occupational History  . Occupation: Retail banker: Self Employed    Comment: [employer]  Tobacco Use  . Smoking status: Former Smoker    Last attempt to quit: 04/30/1996    Years since quitting: 20.8  . Smokeless tobacco: Never Used  Substance and Sexual Activity  . Alcohol use: Yes    Comment: 1 time weekly - 2 drinks of winr  . Drug use: No  . Sexual activity: Yes    Birth control/protection: Pill  Other Topics Concern  . 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 Cyanocobalamin 500 MCG/0.1ML SOLN 1 spray in one nostril qwk 3 Bottle 3  . diphenhydrAMINE (BENADRYL) 25 MG tablet Take 50 mg by mouth every 6 (six) hours as needed for itching or allergies.    . Lactobacillus-Inulin (CULTURELLE DIGESTIVE HEALTH PO) Take 1 tablet by mouth daily.     . methimazole (TAPAZOLE) 5 MG tablet TAKE 1 TABLET BY MOUTH DAILY WITH A MEAL 30 tablet 0  . norethindrone (NORLYDA) 0.35 MG tablet Take 1 tablet by mouth daily.    . pantoprazole (PROTONIX) 40 MG tablet Take 1 tablet (40 mg total) by mouth daily. 90 tablet 3  . pindolol (VISKEN) 5 MG tablet TAKE 1 TABLET(5 MG) BY MOUTH TWICE DAILY 180 tablet 2  .  pyridostigmine (MESTINON) 60 MG tablet Take 1 tablet (60 mg total) by mouth 2 (two) times daily. Please make overdue appt with Dr. Harrington Challenger before anymore refills. 2nd attempt 180 tablet 1  . rizatriptan (MAXALT-MLT) 10 MG disintegrating tablet Take 1 tablet (10 mg total) by mouth as needed for migraine. May repeat in 2 hours if needed 9 tablet 2  . [DISCONTINUED] magnesium oxide (MAG-OX) 400 MG tablet Take 1 tablet (400 mg total) by mouth daily. 30 tablet 5   No current facility-administered medications on file prior to visit.    Allergies  Allergen  Reactions  . Adhesive [Tape] Rash and Other (See Comments)    Skin blistered and disintegrated after epidural (looked like 3rd degree burn) - possibly due to tape and/or tegaderm - do not use anything with adhesive  . Sulfonamide Derivatives Nausea And Vomiting  . Doxycycline Nausea And Vomiting  . Sulfa Antibiotics Nausea And Vomiting  . Amoxicillin Nausea And Vomiting and Rash  . Erythromycin Nausea And Vomiting and Rash  . Latex Rash    See notes under adhesive tape  . Penicillins Nausea And Vomiting and Rash    Has patient had a PCN reaction causing immediate rash, facial/tongue/throat swelling, SOB or lightheadedness with hypotension: Yes Has patient had a PCN reaction causing severe rash involving mucus membranes or skin necrosis: No Has patient had a PCN reaction that required hospitalization: No Has patient had a PCN reaction occurring within the last 10 years: No If all of the above answers are "NO", then may proceed with Cephalosporin use.   Family History  Problem Relation Age of Onset  . Thyroid disease Mother        hypothyroidism  . Migraines Mother   . Colon polyps Mother   . Skin cancer Mother        malignant melanoma  . Crohn's disease Maternal Grandmother   . Parkinson's disease Paternal Grandmother   . Arthritis/Rheumatoid Paternal Grandmother   . Cancer Paternal Grandfather        skin  . Parkinson's disease Paternal  Grandfather   . COPD Neg Hx   . Colon cancer Neg Hx     PE: BP 128/74 (BP Location: Right Arm, Patient Position: Sitting, Cuff Size: Normal)   Pulse 84   Ht 5' 6.5" (1.689 m)   Wt 178 lb 3.2 oz (80.8 kg)   SpO2 99%   BMI 28.33 kg/m  Wt Readings from Last 3 Encounters:  02/27/19 178 lb 3.2 oz (80.8 kg)  05/27/18 167 lb (75.8 kg)  05/05/18 166 lb (75.3 kg)   Constitutional: overweight, in NAD Eyes: PERRLA, EOMI, no exophthalmos ENT: moist mucous membranes, no thyromegaly, no cervical lymphadenopathy Cardiovascular: RRR, No MRG Respiratory: CTA B Gastrointestinal: abdomen soft, NT, ND, BS+ Musculoskeletal: no deformities, strength intact in all 4 Skin: moist, warm, no rashes Neurological: no tremor with outstretched hands, DTR normal in all 4  ASSESSMENT: 1.  Graves' disease - Thyroid uptake and scan (04/03/2017): Upper normal 24 hour radio iodine uptake of 29%. Tiny cold nodule at inferior pole of RIGHT thyroid lobe without dominant thyroid mass.  2.  Small thyroid nodule - Thyroid ultrasound (04/15/2017): Parenchymal Echotexture: Moderately heterogenous Isthmus: 0.3 cm Right lobe: 4.2 x 1.4 x 1.7 cm Left lobe: 4.2 x 1.4 x 1.6 cm _________________________________________________________ Nodule # 1: Location: Right; Inferior Maximum size: 1.2 cm; Other 2 dimensions: 0.8 x 0.8 cm Composition: solid/almost completely solid (2) Echogenicity: isoechoic (1) Given size (<1.4 cm) and appearance, this nodule does NOT meet TI-RADS criteria for biopsy or dedicated follow-up. __________________________________________  An additional small, subcentimeter echogenic nodules present in the left mid gland. This nodule does not meet criteria for further evaluation.  IMPRESSION: Bilateral thyroid nodules which do not meet criteria for further evaluation. No further follow-up is required.  3.  Vitamin B12 deficiency  PLAN:  1. Patient with history of subclinical thyrotoxicosis  with elevated TSI antibodies consistent with mild Graves' disease.  We checked a thyroid uptake and scan and the scan was uniform with the exception of a small cold nodule in the  right lobe of the thyroid.  The uptake was at the upper limit of normal.  This is consistent with Graves' disease.  At the time of the scan, her TSH was slightly low, but this worsened afterwards.  We started a low-dose methimazole, 5 mg daily.  She tolerates this well and feels a little better on it.  However, she was lost for follow-up after our last visit 1 year and 7 months ago.  She returns for another visit today as I declined refilling her medications before I see her.  She is now off the methimazole for the last 3 days. -At this visit, she denies fatigue but continues to have palpitations (she is on chronic beta-blockade for her POTS) she also complains of weight gain.  Her heat intolerance and mild tremors have resolved. -We again discussed about the autoimmune nature of her Graves' disease and possible modalities for treatment to include continuing methimazole, RAI treatment, or, last resort, surgery. -Latest TFTs were normal in 11/2017 on 5 mg of methimazole daily -At today's visit, we decided to recheck her TFTs and continue methimazole, adjusting the dose based on the results - I will see her back in 1 year, but in 6 months for labs  2.  Cold thyroid nodule -Not palpable today -This is very small, however, she describes relatively recent dysphagia (food getting stuck in her throat when she tries to swallow) so we decided to check another ultrasound of her thyroid.  If the dominant nodule appears unchanged, no further work-up is needed.  3. B12 deficiency -She has a history of B12 deficiency for which she was using intranasal B12 in the past but could not afford it anymore.  In the past, I recommended 5000 mcg B12 daily or injections.  She opted to start the.  P.o. formulation. -Latest B12 level checked by PCP was  reviewed and this was normal in 04/2018 -Further follow-up per PCP  Needs refills of methimazole.  Component     Latest Ref Rng & Units 02/27/2019  TSH     0.35 - 4.50 uIU/mL 2.58  Triiodothyronine,Free,Serum     2.3 - 4.2 pg/mL 3.3  T4,Free(Direct)     0.60 - 1.60 ng/dL 0.87  Thyroid tests are normal, but we can definitely try to decrease the dose of methimazole to 2.5 mg daily and recheck the test in 1 month.  Thyroid U/S (04/01/2019): Narrative & Impression    CLINICAL DATA:  Other.  Dysphagia.  Follow-up thyroid nodules.  COMPARISON:  04/15/2017  FINDINGS: Parenchymal Echotexture: Mildly heterogenous Isthmus: Normal in size measures 0.5 cm in diameter, unchanged Right lobe: Normal in size measuring 4.3 x 1.3 x 1.8 cm, unchanged, previously, 4.2 x 1.4 x 1.7 cm Left lobe: Normal in size measuring 4.4 x 1.6 x 1.7 cm, unchanged, previously, 4.2 x 1.4 x 1.6 cm ___________________________________  Nodule # 1: Prior biopsy: No Location: Right; Inferior Maximum size: 1.5 cm; Other 2 dimensions: 1.5 x 0.7 cm, previously, 1.3 x 1.1 x 0.7 cm Composition: solid/almost completely solid (2) Echogenicity: hypoechoic (2) Significant change in size (>/= 20% in two dimensions and minimal increase of 2 mm): Yes Change in features: Yes nodule now appears hypoechoic, previously, isoechoic Change in ACR TI-RADS risk category: Yes previously a TR3 nodule, currently a TR4 nodule.  **Given size (>/= 1.5 cm) and appearance, fine needle aspiration of this moderately suspicious nodule should be considered based on TI-RADS criteria. _________________________________________________________  The approximately 0.8 cm isoechoic ill-defined nodule/pseudonodule within the mid  aspect the right lobe of the thyroid labeled 2), is unchanged compared to the 03/2017 examination, previously, 0.7 cm, and again does not meet imaging criteria to recommend percutaneous sampling or continued dedicated  follow-up.  IMPRESSION: Nodule #1 within the inferior pole the right lobe of the thyroid has increased in size and now appears hypoechoic and as such has been up graded from a TR3 nodule to a TR4 nodule and now meets imaging criteria to recommend percutaneous sampling as indicated.  Electronically Signed   By: Sandi Mariscal M.D.   On: 04/02/2019 08:48     I suggested biopsy of the nodule.  04/09/2019: FNA: Atypia of undetermined significance (Bethesda category III); Afirma: Suspicious (50% cancer risk)  I called and discussed with patient about what the result means and she agrees with the referral to surgery.  She will probably need right lobectomy.  I plan to see her back in the office in 1 to 1.5 months after surgery.  She is describing some hyperthyroid symptoms around the time of the holidays including hot flushes.  I advised her to come back for TFTs as soon as possible.  We may need adjustment of the methimazole dose  Philemon Kingdom, MD PhD [facility]

## 2019-03-30 ENCOUNTER — Other Ambulatory Visit: Payer: Self-pay | Admitting: Internal Medicine

## 2019-04-01 ENCOUNTER — Ambulatory Visit
Admission: RE | Admit: 2019-04-01 | Discharge: 2019-04-01 | Disposition: A | Discharge status: Home / Self Care | Payer: BC Managed Care – PPO | Source: Ambulatory Visit | Attending: Internal Medicine | Admitting: Internal Medicine

## 2019-04-01 DIAGNOSIS — E041 Nontoxic single thyroid nodule: Secondary | ICD-10-CM

## 2019-04-02 ENCOUNTER — Encounter: Payer: Self-pay | Admitting: Internal Medicine

## 2019-04-02 ENCOUNTER — Other Ambulatory Visit: Payer: Self-pay | Admitting: Internal Medicine

## 2019-04-02 DIAGNOSIS — E041 Nontoxic single thyroid nodule: Secondary | ICD-10-CM

## 2019-04-09 ENCOUNTER — Ambulatory Visit
Admission: RE | Admit: 2019-04-09 | Discharge: 2019-04-09 | Disposition: A | Discharge status: Home / Self Care | Payer: BC Managed Care – PPO | Source: Ambulatory Visit | Attending: Internal Medicine | Admitting: Internal Medicine

## 2019-04-09 ENCOUNTER — Encounter: Payer: Self-pay | Admitting: Internal Medicine

## 2019-04-09 ENCOUNTER — Other Ambulatory Visit (HOSPITAL_COMMUNITY)
Admission: RE | Admit: 2019-04-09 | Discharge: 2019-04-09 | Disposition: A | Discharge status: Home / Self Care | Payer: BC Managed Care – PPO | Source: Ambulatory Visit | Attending: Radiology | Admitting: Radiology

## 2019-04-09 DIAGNOSIS — E041 Nontoxic single thyroid nodule: Secondary | ICD-10-CM | POA: Insufficient documentation

## 2019-04-09 DIAGNOSIS — R896 Abnormal cytological findings in specimens from other organs, systems and tissues: Secondary | ICD-10-CM | POA: Diagnosis not present

## 2019-04-10 ENCOUNTER — Encounter: Payer: Self-pay | Admitting: Internal Medicine

## 2019-04-10 LAB — CYTOLOGY - NON PAP

## 2019-04-16 ENCOUNTER — Other Ambulatory Visit: Payer: BC Managed Care – PPO

## 2019-04-16 DIAGNOSIS — E041 Nontoxic single thyroid nodule: Secondary | ICD-10-CM | POA: Diagnosis not present

## 2019-04-27 ENCOUNTER — Encounter: Payer: Self-pay | Admitting: Internal Medicine

## 2019-05-01 HISTORY — PX: CERVICAL FUSION: SHX112

## 2019-05-05 ENCOUNTER — Telehealth: Payer: Self-pay

## 2019-05-05 NOTE — Telephone Encounter (Signed)
Patient called in wanting to know if her results were in yet.    Please call and advise

## 2019-05-05 NOTE — Addendum Note (Signed)
Addended by: Philemon Kingdom on: 05/05/2019 12:56 PM   Modules accepted: Orders

## 2019-05-06 ENCOUNTER — Encounter: Payer: Self-pay | Admitting: Internal Medicine

## 2019-05-06 ENCOUNTER — Other Ambulatory Visit: Payer: Self-pay

## 2019-05-06 ENCOUNTER — Other Ambulatory Visit (INDEPENDENT_AMBULATORY_CARE_PROVIDER_SITE_OTHER): Payer: BC Managed Care – PPO

## 2019-05-06 DIAGNOSIS — E0500 Thyrotoxicosis with diffuse goiter without thyrotoxic crisis or storm: Secondary | ICD-10-CM

## 2019-05-06 LAB — TSH: TSH: 2.17 u[IU]/mL (ref 0.35–4.50)

## 2019-05-06 LAB — T3, FREE: T3, Free: 3.5 pg/mL (ref 2.3–4.2)

## 2019-05-06 LAB — T4, FREE: Free T4: 1.13 ng/dL (ref 0.60–1.60)

## 2019-05-07 ENCOUNTER — Encounter: Payer: Self-pay | Admitting: Internal Medicine

## 2019-05-07 ENCOUNTER — Telehealth: Payer: Self-pay

## 2019-05-07 ENCOUNTER — Ambulatory Visit (INDEPENDENT_AMBULATORY_CARE_PROVIDER_SITE_OTHER): Payer: BC Managed Care – PPO | Admitting: Internal Medicine

## 2019-05-07 DIAGNOSIS — M545 Low back pain, unspecified: Secondary | ICD-10-CM | POA: Insufficient documentation

## 2019-05-07 DIAGNOSIS — M5442 Lumbago with sciatica, left side: Secondary | ICD-10-CM | POA: Diagnosis not present

## 2019-05-07 MED ORDER — PREDNISONE 20 MG PO TABS: 40.0000 mg | ORAL_TABLET | Freq: Every day | ORAL | 0 refills | Status: DC

## 2019-05-07 NOTE — Progress Notes (Signed)
Virtual Visit via Video Note  I connected with Chelsey Sanford on 05/07/19 at 11:15 AM EST by a video enabled telemedicine application and verified that I am speaking with the correct person using two identifiers.   I discussed the limitations of evaluation and management by telemedicine and the availability of in person appointments. The patient expressed understanding and agreed to proceed.  Present for the visit:  Myself, Dr Billey Gosling, Chelsey Sanford.  The patient is currently at home and I am in the office.    No referring provider.    History of Present Illness: This is an acute visit for back pain  She does have a history of spondylosis of the lumbar region and has had sciatica in the past.  Her current pain is different.  2 days ago she rode her Peloton bike, which she typically rides every other day.  After 30-40 minutes of writing she felt fine.  Later that day she started to have pain in the lower back that radiates to her hip.  She also has pain going down the left leg.  Her previous episodes of sciatica and voice been on the right.  The pain is intense.  She has difficulty sitting because of the pain.  She has pain with sleeping.  She did try taking Aleve and using Voltaren gel, which typically works for her back pain, but it did not help much.  She has not had any numbness or tingling in the left leg.  She denies any changes in her bowel or bladder habits.  She is unsure if there is weakness in the left leg or if the pain is just inhibiting her from using it normally.    Social History   Socioeconomic History  . Marital status: Married    Spouse name: [name]  . Number of children: 1  . Years of education: 6  . Highest education level: Not on file  Occupational History  . Occupation: Retail banker: Self Employed    Comment: [employer]  Tobacco Use  . Smoking status: Former Smoker    Quit date: 04/30/1996    Years since quitting: 23.0  .  Smokeless tobacco: Never Used  Substance and Sexual Activity  . Alcohol use: Yes    Comment: several times weekly  . Drug use: No  . Sexual activity: Yes    Birth control/protection: Pill  Other Topics Concern  . 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 Difficulty of Paying Living Expenses: Not on file  Food Insecurity:   . Worried About Charity fundraiser in the Last Year: Not on file  . Ran Out of Food in the Last Year: Not on file  Transportation Needs:   . Lack of Transportation (Medical): Not on file  . Lack of Transportation (Non-Medical): Not on file  Physical Activity:   . Days of Exercise per Week: Not on file  . Minutes of Exercise per Session: Not on file  Stress:   . Feeling of Stress : Not on file  Social Connections:   . Frequency of Communication with Friends and Family: Not on file  . Frequency  of Social Gatherings with Friends and Family: Not on file  . Attends Religious Services: Not on file  . Active Member of Clubs or Organizations: Not on file  . Attends Archivist Meetings: Not on file  . Marital Status: Not on file     Observations/Objective: Appears well in NAD Breathing normally Skin appears warm and dry  Assessment and Plan:  See Problem List for Assessment and Plan of chronic medical problems.   Follow Up Instructions:    I discussed the assessment and treatment plan with the patient. The patient was provided an opportunity to ask questions and all were answered. The patient agreed with the plan and demonstrated an understanding of the instructions.   The patient was advised to call back or seek an in-person evaluation if the symptoms worsen or if the condition  fails to improve as anticipated.    Binnie Rail, MD

## 2019-05-07 NOTE — Telephone Encounter (Signed)
Called and spoke with patient today. 

## 2019-05-07 NOTE — Assessment & Plan Note (Signed)
Acute onset of lower back pain 2 days ago with radiation to left hip and down left leg Possibly some leg weakness, no numbness, tingling or changes in bowel/bladder History of previous sciatica No improvement with Aleve or Voltaren gel She is interested in trying steroids, which had helped her in the past-prednisone 40 mg daily x5 days Deferred gabapentin If the prednisone does not help will need to be seen for further evaluation-orthopedics or sports medicine ideally

## 2019-05-11 ENCOUNTER — Encounter (HOSPITAL_COMMUNITY): Payer: Self-pay

## 2019-05-18 ENCOUNTER — Encounter: Payer: Self-pay | Admitting: Internal Medicine

## 2019-05-19 DIAGNOSIS — M545 Low back pain: Secondary | ICD-10-CM | POA: Diagnosis not present

## 2019-05-22 ENCOUNTER — Ambulatory Visit: Payer: Self-pay | Admitting: Surgery

## 2019-05-22 DIAGNOSIS — D440 Neoplasm of uncertain behavior of thyroid gland: Secondary | ICD-10-CM | POA: Diagnosis not present

## 2019-05-22 DIAGNOSIS — E041 Nontoxic single thyroid nodule: Secondary | ICD-10-CM | POA: Diagnosis not present

## 2019-05-22 DIAGNOSIS — E0590 Thyrotoxicosis, unspecified without thyrotoxic crisis or storm: Secondary | ICD-10-CM | POA: Diagnosis not present

## 2019-06-01 ENCOUNTER — Other Ambulatory Visit: Payer: Self-pay | Admitting: Internal Medicine

## 2019-06-01 NOTE — Patient Instructions (Addendum)
Get your covid test 2/4.21 at 3:05 PM. Go to [facility] in [place]. Go to the [place] over[place]. It is a drive thru.   Chelsey Sanford        Your procedure is scheduled on 06/08/19 1  Report to [facility]  At 5:30  A.M.   Call this number if you have problems the morning of surgery:[phone]   OUR ADDRESS IS [address], WE ARE LOCATED IN THE [facility] WITH [facility].   Remember:  Do not eat food or drink liquids after midnight.   Take these medicines the morning of surgery with A SIP OF WATER: Pindolol,  Methimazole, Protonix   Do not wear jewelry, make-up or nail polish.  Do not wear lotions, powders, or perfumes, or deoderant.  Do not shave 48 hours prior to surgery.    Do not bring valuables to the hospital.  [facility] is not responsible for any belongings or valuables.  Contacts, dentures or bridgework may not be worn into surgery.    For patients admitted to the hospital, discharge time will be determined by your treatment team.  Patients discharged the day of surgery will not be allowed to drive home.   Special instructions: Bring meds in original bottles  Please read over the following fact sheets that you were given:     [facility] - Preparing for Surgery  Before surgery, you can play an important role.   Because skin is not sterile, your skin needs to be as free of germs as possible .  You can reduce the number of germs on your skin by washing with CHG (chlorahexidine gluconate) soap before surgery.   CHG is an antiseptic cleaner which kills germs and bonds with the skin to continue killing germs even after washing. Please DO NOT use if you have an allergy to CHG or antibacterial soaps.   If your skin becomes reddened/irritated stop using the CHG and inform your nurse when you arrive at Short Stay. Do not shave (including legs and underarms) for at least 48 hours prior to the first CHG shower.    Please follow  these instructions carefully:  1.  Shower with CHG Soap the night before surgery and the  morning of Surgery.  2.  If you choose to wash your hair, wash your hair first as usual with your  normal  shampoo.  3.  After you shampoo, rinse your hair and body thoroughly to remove the  shampoo.                                        4.  Use CHG as you would any other liquid soap.  You can apply chg directly  to the skin and wash                       Gently with a scrungie or clean washcloth.  5.  Apply the CHG Soap to your body ONLY FROM THE NECK DOWN.   Do not use on face/ open                           Wound or open sores. Avoid contact with eyes, ears mouth and genitals (private parts).  Wash face,  Genitals (private parts) with your normal soap.             6.  Wash thoroughly, paying special attention to the area where your surgery  will be performed.  7.  Thoroughly rinse your body with warm water from the neck down.  8.  DO NOT shower/wash with your normal soap after using and rinsing off  the CHG Soap.             9.  Pat yourself dry with a clean towel.            10.  Wear clean pajamas.            11.  Place clean sheets on your bed the night of your first shower and do not  sleep with pets. Day of Surgery : Do not apply any lotions/deodorants the morning of surgery.  Please wear clean clothes to the hospital/surgery center.  FAILURE TO FOLLOW THESE INSTRUCTIONS MAY RESULT IN THE CANCELLATION OF YOUR SURGERY PATIENT SIGNATURE_________________________________  NURSE SIGNATURE__________________________________  ________________________________________________________________________

## 2019-06-02 ENCOUNTER — Encounter (HOSPITAL_COMMUNITY)
Admission: RE | Admit: 2019-06-02 | Discharge: 2019-06-02 | Disposition: A | Discharge status: Home / Self Care | Payer: BC Managed Care – PPO | Source: Ambulatory Visit | Attending: Surgery | Admitting: Surgery

## 2019-06-02 ENCOUNTER — Encounter (HOSPITAL_COMMUNITY): Payer: Self-pay

## 2019-06-02 ENCOUNTER — Other Ambulatory Visit: Payer: Self-pay

## 2019-06-02 DIAGNOSIS — Z01812 Encounter for preprocedural laboratory examination: Secondary | ICD-10-CM | POA: Insufficient documentation

## 2019-06-02 HISTORY — DX: Other specified postprocedural states: R11.2

## 2019-06-02 HISTORY — DX: Other specified postprocedural states: Z98.890

## 2019-06-02 HISTORY — DX: Other complications of anesthesia, initial encounter: T88.59XA

## 2019-06-02 NOTE — Progress Notes (Signed)
PCP - Dr. Billey Gosling Cardiologist - Dr. Lizbeth Bark  Chest x-ray - no EKG - 05/05/18 Stress Test - no ECHO - no Cardiac Cath - no  Sleep Study -no  CPAP - no  Fasting Blood Sugar - NA Checks Blood Sugar _____ times a day  Blood Thinner Instructions:NA Aspirin Instructions: Last Dose:  Anesthesia review:   Patient denies shortness of breath, fever, cough and chest pain at PAT appointment yes  Patient verbalized understanding of instructions that were given to them at the PAT appointment. Patient was also instructed that they will need to review over the PAT instructions again at home before surgery. Yes Pt has POTS. It is triggered by dehydration. She has not had any problems with anesthesia in the past. She did report PONV but the scopolamine patch helps.

## 2019-06-03 ENCOUNTER — Encounter (HOSPITAL_COMMUNITY)
Admission: RE | Admit: 2019-06-03 | Discharge: 2019-06-03 | Disposition: A | Discharge status: Home / Self Care | Payer: BC Managed Care – PPO | Source: Ambulatory Visit | Attending: Surgery | Admitting: Surgery

## 2019-06-03 DIAGNOSIS — Z01812 Encounter for preprocedural laboratory examination: Secondary | ICD-10-CM | POA: Diagnosis not present

## 2019-06-03 LAB — CBC
HCT: 41.5 % (ref 36.0–46.0)
Hemoglobin: 14 g/dL (ref 12.0–15.0)
MCH: 28.6 pg (ref 26.0–34.0)
MCHC: 33.7 g/dL (ref 30.0–36.0)
MCV: 84.7 fL (ref 80.0–100.0)
Platelets: 283 10*3/uL (ref 150–400)
RBC: 4.9 MIL/uL (ref 3.87–5.11)
RDW: 12 % (ref 11.5–15.5)
WBC: 8.9 10*3/uL (ref 4.0–10.5)
nRBC: 0 % (ref 0.0–0.2)

## 2019-06-04 ENCOUNTER — Other Ambulatory Visit (HOSPITAL_COMMUNITY)
Admission: RE | Admit: 2019-06-04 | Discharge: 2019-06-04 | Disposition: A | Discharge status: Home / Self Care | Payer: BC Managed Care – PPO | Source: Ambulatory Visit | Attending: Surgery | Admitting: Surgery

## 2019-06-04 DIAGNOSIS — Z20822 Contact with and (suspected) exposure to covid-19: Secondary | ICD-10-CM | POA: Diagnosis not present

## 2019-06-04 DIAGNOSIS — Z01812 Encounter for preprocedural laboratory examination: Secondary | ICD-10-CM | POA: Diagnosis not present

## 2019-06-04 LAB — SARS CORONAVIRUS 2 (TAT 6-24 HRS): SARS Coronavirus 2: NEGATIVE

## 2019-06-07 ENCOUNTER — Encounter (HOSPITAL_BASED_OUTPATIENT_CLINIC_OR_DEPARTMENT_OTHER): Payer: Self-pay | Admitting: Surgery

## 2019-06-07 DIAGNOSIS — E041 Nontoxic single thyroid nodule: Secondary | ICD-10-CM | POA: Diagnosis present

## 2019-06-07 DIAGNOSIS — D440 Neoplasm of uncertain behavior of thyroid gland: Secondary | ICD-10-CM | POA: Diagnosis present

## 2019-06-07 NOTE — H&P (Signed)
General Surgery [facility]  Chelsey Sanford DOB: 31-Aug-1975 Married / Language: English / Race: White Female   History of Present Illness   The patient is a 44 year old female who presents with a thyroid nodule.  CHIEF COMPLAINT: right thyroid neoplasm of uncertain behavior  And is referred by Dr. Philemon Kingdom for surgical evaluation and management of a right thyroid nodule with cytologic atypia. Patient's primary care physician is Dr. Billey Gosling. Patient's gynecologist is Dr. Marvel Plan. Patient was first noted by her gynecologist a few years ago to have abnormal thyroid function test. She was evaluated by her primary care physician and eventually referred to endocrinology for evaluation. At the time the patient underwent ultrasound examination and was treated with methimazole for hyperthyroidism. Recently the patient had some change in symptoms. She developed an occasional globus sensation. She underwent repeat thyroid ultrasound in December 2020. This showed a change in size and shape of a right inferior thyroid nodule measuring 1.5 cm in greatest dimension. Fine-needle aspiration biopsy was performed on April 09, 2019. This showed atypia of undetermined significance, Bethesda category III. Subsequent molecular genetic testing, AFIRMA, revealed a suspicious lesion with a risk of malignancy of 50%. Patient is now referred to surgery for consideration for right thyroid lobectomy for definitive diagnosis and management. Patient is currently taking methimazole 2.5 mg daily. Her most recent TSH level was normal at 2.17 on May 06, 2019. Patient has never been on levothyroxine. She has had no prior head or neck surgery. There is a family history of hypothyroidism and the patient's mother. There is no family history of thyroid cancer or other endocrine neoplasm. Patient has had an occasional globus sensation. Otherwise she has not had any significant  compressive symptoms. 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   Past Surgical History Breast Biopsy  Right. Foot Surgery  Right. Oral Surgery  Tonsillectomy   Diagnostic Studies History Colonoscopy  5-10 years ago Mammogram  within last year Pap Smear  1-5 years ago  Allergies Penicillins  Sulfa Drugs  Latex  Adhesive Tape  Allergies Reconciled   Medication History Pyridostigmine Bromide (60MG  Tablet, Oral) Active. Pantoprazole Sodium (40MG  Tablet DR, Oral) Active. Pindolol (5MG  Tablet, Oral) Active. methylPREDNISolone (4MG  Tab Ther Pack, Oral) Active. Aleve PM (220-25MG  Tablet, Oral) Active. methIMAzole (5MG  Tablet, Oral) Active. Cyanocobalamin (500MCG/0.1ML Solution, Nasal once a week) Active. Culturelle (Oral) Active. B Complex Vitamins (Oral) Active. Rizatriptan Benzoate (10MG  Tablet Disint, Oral) Active. Sudafed (30MG  Tablet, Oral) Active. Flonase (50MCG/DOSE Inhaler, Nasal) Active. ZyrTEC (10MG  Tablet, Oral) Active. Medications Reconciled  Social History  Alcohol use  Moderate alcohol use. Caffeine use  Coffee. No drug use  Tobacco use  Former smoker.  Family History  Melanoma  Mother. Thyroid problems  Mother.  Pregnancy / Birth History Age at menarche  38 years. Gravida  1 Length (months) of breastfeeding  7-12 Maternal age  51-30 Para  1 Regular periods   Other Problems  Back Pain  Lump In Breast  Migraine Headache  Thyroid Disease   Review of Systems General Not Present- Appetite Loss, Chills, Fatigue, Fever, Night Sweats, Weight Gain and Weight Loss. Skin Not Present- Change in Wart/Mole, Dryness, Hives, Jaundice, New Lesions, Non-Healing Wounds, Rash and Ulcer. Respiratory Not Present- Bloody sputum, Chronic Cough, Difficulty Breathing, Snoring and Wheezing. Breast Not Present- Breast Mass, Breast Pain, Nipple Discharge and Skin Changes. Cardiovascular Not Present- Chest  Pain, Difficulty Breathing Lying Down, Leg Cramps, Palpitations, Rapid Heart Rate, Shortness  of Breath and Swelling of Extremities. Gastrointestinal Not Present- Abdominal Pain, Bloating, Bloody Stool, Change in Bowel Habits, Chronic diarrhea, Constipation, Difficulty Swallowing, Excessive gas, Gets full quickly at meals, Hemorrhoids, Indigestion, Nausea, Rectal Pain and Vomiting. Female Genitourinary Not Present- Frequency, Nocturia, Painful Urination, Pelvic Pain and Urgency. Musculoskeletal Present- Back Pain. Not Present- Joint Pain, Joint Stiffness, Muscle Pain, Muscle Weakness and Swelling of Extremities. Neurological Present- Headaches. Not Present- Decreased Memory, Fainting, Numbness, Seizures, Tingling, Tremor, Trouble walking and Weakness. Psychiatric Not Present- Anxiety, Bipolar, Change in Sleep Pattern, Depression, Fearful and Frequent crying. Endocrine Present- Hair Changes and Hot flashes. Not Present- Cold Intolerance, Excessive Hunger, Heat Intolerance and New Diabetes.  Vitals  Weight: 176.6 lb Height: 66.5in Body Surface Area: 1.91 m Body Mass Index: 28.08 kg/m  Temp.: 97.7F(Thermal Scan)  Pulse: 76 (Regular)  BP: 118/72 (Sitting, Left Arm, Standard)   Physical Exam  GENERAL APPEARANCE Development: normal Nutritional status: normal Gross deformities: none  SKIN Rash, lesions, ulcers: none Induration, erythema: none Nodules: none palpable  EYES Conjunctiva and lids: normal Pupils: equal and reactive Iris: normal bilaterally  EARS, NOSE, MOUTH, THROAT External ears: no lesion or deformity External nose: no lesion or deformity Hearing: grossly normal Patient is wearing a mask.  NECK Symmetric: yes Trachea: midline Thyroid: no palpable nodules in the thyroid bed; in the right thyroid lobe, there is some subtle nodularity palpable with swallowing but not a discrete mass. There is no associated cervical or supraclavicular  lymphadenopathy.  CHEST Respiratory effort: normal Retraction or accessory muscle use: no Breath sounds: normal bilaterally Rales, rhonchi, wheeze: none  CARDIOVASCULAR Auscultation: regular rhythm, normal rate Murmurs: none Pulses: carotid and radial pulse 2+ palpable Lower extremity edema: none Lower extremity varicosities: none  MUSCULOSKELETAL Station and gait: normal Digits and nails: no clubbing or cyanosis Muscle strength: grossly normal all extremities Range of motion: grossly normal all extremities Deformity: none  LYMPHATIC Cervical: none palpable Supraclavicular: none palpable  PSYCHIATRIC Oriented to person, place, and time: yes Mood and affect: normal for situation Judgment and insight: appropriate for situation    Assessment & Plan   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 Patient provided with a copy of "The Thyroid Book: Medical and Surgical Treatment of Thyroid Problems", published by [employer], 16 pages. Book reviewed and explained to patient during visit today.  Patient has a 1.5 cm mass in the right thyroid lobe which is suspicious on molecular genetic testing with a 50% risk of malignancy. Today we have discussed right thyroid lobectomy for management and definitive diagnosis. We have discussed the risk and benefits of the procedure including the potential for recurrent laryngeal nerve injury and injury to parathyroid glands. We have discussed the possibility of further surgery if necessary due to malignancy. We have discussed the possibility of radioactive iodine treatment. We discussed the possibility of lifelong thyroid hormone replacement therapy. We discussed the hospital stay to be anticipated in the location and cosmetic results from the incision. The patient  understands and wishes to proceed with surgery in the near future.  The risks and benefits of the procedure have been discussed at length with the patient. The patient understands the proposed procedure, potential alternative treatments, and the course of recovery to be expected. All of the patient's questions have been answered at this time. The patient wishes to proceed with surgery.  Armandina Gemma,  MD [facility] Office: [phone]

## 2019-06-08 ENCOUNTER — Ambulatory Visit (HOSPITAL_BASED_OUTPATIENT_CLINIC_OR_DEPARTMENT_OTHER)
Admission: RE | Admit: 2019-06-08 | Discharge: 2019-06-09 | Disposition: A | Discharge status: Home / Self Care | Payer: BC Managed Care – PPO | Attending: Surgery | Admitting: Surgery

## 2019-06-08 ENCOUNTER — Encounter (HOSPITAL_BASED_OUTPATIENT_CLINIC_OR_DEPARTMENT_OTHER)
Admission: RE | Disposition: A | Discharge status: Home / Self Care | Payer: Self-pay | Source: Home / Self Care | Attending: Surgery

## 2019-06-08 ENCOUNTER — Ambulatory Visit (HOSPITAL_BASED_OUTPATIENT_CLINIC_OR_DEPARTMENT_OTHER): Payer: BC Managed Care – PPO | Admitting: Physician Assistant

## 2019-06-08 ENCOUNTER — Other Ambulatory Visit: Payer: Self-pay

## 2019-06-08 ENCOUNTER — Encounter (HOSPITAL_BASED_OUTPATIENT_CLINIC_OR_DEPARTMENT_OTHER): Payer: Self-pay | Admitting: Surgery

## 2019-06-08 ENCOUNTER — Ambulatory Visit (HOSPITAL_BASED_OUTPATIENT_CLINIC_OR_DEPARTMENT_OTHER): Payer: BC Managed Care – PPO | Admitting: Anesthesiology

## 2019-06-08 DIAGNOSIS — E041 Nontoxic single thyroid nodule: Secondary | ICD-10-CM | POA: Diagnosis present

## 2019-06-08 DIAGNOSIS — G43909 Migraine, unspecified, not intractable, without status migrainosus: Secondary | ICD-10-CM | POA: Diagnosis not present

## 2019-06-08 DIAGNOSIS — Z79890 Hormone replacement therapy (postmenopausal): Secondary | ICD-10-CM | POA: Insufficient documentation

## 2019-06-08 DIAGNOSIS — Z79899 Other long term (current) drug therapy: Secondary | ICD-10-CM | POA: Diagnosis not present

## 2019-06-08 DIAGNOSIS — D440 Neoplasm of uncertain behavior of thyroid gland: Secondary | ICD-10-CM | POA: Diagnosis present

## 2019-06-08 DIAGNOSIS — K219 Gastro-esophageal reflux disease without esophagitis: Secondary | ICD-10-CM | POA: Insufficient documentation

## 2019-06-08 DIAGNOSIS — Z8349 Family history of other endocrine, nutritional and metabolic diseases: Secondary | ICD-10-CM | POA: Insufficient documentation

## 2019-06-08 DIAGNOSIS — E0590 Thyrotoxicosis, unspecified without thyrotoxic crisis or storm: Secondary | ICD-10-CM | POA: Diagnosis present

## 2019-06-08 DIAGNOSIS — E063 Autoimmune thyroiditis: Secondary | ICD-10-CM | POA: Diagnosis not present

## 2019-06-08 DIAGNOSIS — E0510 Thyrotoxicosis with toxic single thyroid nodule without thyrotoxic crisis or storm: Secondary | ICD-10-CM | POA: Diagnosis not present

## 2019-06-08 DIAGNOSIS — D34 Benign neoplasm of thyroid gland: Secondary | ICD-10-CM | POA: Diagnosis not present

## 2019-06-08 DIAGNOSIS — Z7952 Long term (current) use of systemic steroids: Secondary | ICD-10-CM | POA: Diagnosis not present

## 2019-06-08 DIAGNOSIS — Z87891 Personal history of nicotine dependence: Secondary | ICD-10-CM | POA: Diagnosis not present

## 2019-06-08 DIAGNOSIS — E559 Vitamin D deficiency, unspecified: Secondary | ICD-10-CM | POA: Diagnosis not present

## 2019-06-08 HISTORY — PX: THYROID LOBECTOMY: SHX420

## 2019-06-08 LAB — POCT PREGNANCY, URINE: Preg Test, Ur: NEGATIVE

## 2019-06-08 SURGERY — LOBECTOMY, THYROID
Anesthesia: General | Site: Neck | Laterality: Right

## 2019-06-08 MED ORDER — ROCURONIUM BROMIDE 10 MG/ML (PF) SYRINGE
PREFILLED_SYRINGE | INTRAVENOUS | Status: DC | PRN
  Administered 2019-06-08: 50 mg via INTRAVENOUS

## 2019-06-08 MED ORDER — PANTOPRAZOLE SODIUM 40 MG PO TBEC
40.0000 mg | DELAYED_RELEASE_TABLET | Freq: Every day | ORAL | Status: DC
  Filled 2019-06-08: qty 1

## 2019-06-08 MED ORDER — ONDANSETRON 4 MG PO TBDP
4.0000 mg | ORAL_TABLET | Freq: Four times a day (QID) | ORAL | Status: DC | PRN
  Filled 2019-06-08: qty 1

## 2019-06-08 MED ORDER — FENTANYL CITRATE (PF) 100 MCG/2ML IJ SOLN
INTRAMUSCULAR | Status: DC | PRN
  Administered 2019-06-08: 100 ug via INTRAVENOUS
  Administered 2019-06-08 (×2): 50 ug via INTRAVENOUS

## 2019-06-08 MED ORDER — OXYCODONE HCL 5 MG PO TABS
ORAL_TABLET | ORAL | Status: AC
  Filled 2019-06-08: qty 2

## 2019-06-08 MED ORDER — HYDROMORPHONE HCL 1 MG/ML IJ SOLN
INTRAMUSCULAR | Status: AC
  Filled 2019-06-08: qty 1

## 2019-06-08 MED ORDER — PROPOFOL 10 MG/ML IV BOLUS
INTRAVENOUS | Status: AC
  Filled 2019-06-08: qty 40

## 2019-06-08 MED ORDER — DEXAMETHASONE SODIUM PHOSPHATE 10 MG/ML IJ SOLN
INTRAMUSCULAR | Status: AC
  Filled 2019-06-08: qty 1

## 2019-06-08 MED ORDER — TRAMADOL HCL 50 MG PO TABS
50.0000 mg | ORAL_TABLET | Freq: Four times a day (QID) | ORAL | Status: DC | PRN
  Filled 2019-06-08: qty 1

## 2019-06-08 MED ORDER — LIDOCAINE 2% (20 MG/ML) 5 ML SYRINGE
INTRAMUSCULAR | Status: AC
  Filled 2019-06-08: qty 5

## 2019-06-08 MED ORDER — PINDOLOL 5 MG PO TABS
5.0000 mg | ORAL_TABLET | Freq: Every day | ORAL | Status: DC
  Administered 2019-06-08: 5 mg via ORAL
  Filled 2019-06-08: qty 1

## 2019-06-08 MED ORDER — LACTATED RINGERS IV SOLN
INTRAVENOUS | Status: DC
  Administered 2019-06-08: 1000 mL via INTRAVENOUS
  Filled 2019-06-08: qty 1000

## 2019-06-08 MED ORDER — KETOROLAC TROMETHAMINE 30 MG/ML IJ SOLN
INTRAMUSCULAR | Status: DC | PRN
  Administered 2019-06-08: 30 mg via INTRAVENOUS

## 2019-06-08 MED ORDER — PROMETHAZINE HCL 25 MG/ML IJ SOLN
6.2500 mg | INTRAMUSCULAR | Status: DC | PRN
  Filled 2019-06-08: qty 1

## 2019-06-08 MED ORDER — ACETAMINOPHEN 325 MG PO TABS
650.0000 mg | ORAL_TABLET | Freq: Four times a day (QID) | ORAL | Status: DC | PRN
  Filled 2019-06-08: qty 2

## 2019-06-08 MED ORDER — MEPERIDINE HCL 25 MG/ML IJ SOLN
6.2500 mg | INTRAMUSCULAR | Status: DC | PRN
  Filled 2019-06-08: qty 1

## 2019-06-08 MED ORDER — OXYCODONE HCL 5 MG PO TABS
5.0000 mg | ORAL_TABLET | Freq: Once | ORAL | Status: DC | PRN
  Filled 2019-06-08: qty 1

## 2019-06-08 MED ORDER — ACETAMINOPHEN 650 MG RE SUPP
650.0000 mg | Freq: Four times a day (QID) | RECTAL | Status: DC | PRN
  Filled 2019-06-08: qty 1

## 2019-06-08 MED ORDER — SCOPOLAMINE 1 MG/3DAYS TD PT72
MEDICATED_PATCH | TRANSDERMAL | Status: DC | PRN
  Administered 2019-06-08: 1 via TRANSDERMAL

## 2019-06-08 MED ORDER — MIDAZOLAM HCL 2 MG/2ML IJ SOLN
INTRAMUSCULAR | Status: DC | PRN
  Administered 2019-06-08: 2 mg via INTRAVENOUS

## 2019-06-08 MED ORDER — CHLORHEXIDINE GLUCONATE CLOTH 2 % EX PADS
6.0000 | MEDICATED_PAD | Freq: Once | CUTANEOUS | Status: DC
  Filled 2019-06-08: qty 6

## 2019-06-08 MED ORDER — OXYCODONE HCL 5 MG PO TABS
ORAL_TABLET | ORAL | Status: AC
  Filled 2019-06-08: qty 1

## 2019-06-08 MED ORDER — ACETAMINOPHEN 325 MG PO TABS
ORAL_TABLET | ORAL | Status: DC | PRN
  Administered 2019-06-08: 1000 mg via ORAL

## 2019-06-08 MED ORDER — CIPROFLOXACIN IN D5W 400 MG/200ML IV SOLN
INTRAVENOUS | Status: AC
  Filled 2019-06-08: qty 200

## 2019-06-08 MED ORDER — ONDANSETRON HCL 4 MG/2ML IJ SOLN
INTRAMUSCULAR | Status: AC
  Filled 2019-06-08: qty 2

## 2019-06-08 MED ORDER — CIPROFLOXACIN IN D5W 400 MG/200ML IV SOLN
400.0000 mg | INTRAVENOUS | Status: AC
  Administered 2019-06-08: 07:00:00 400 mg via INTRAVENOUS
  Filled 2019-06-08: qty 200

## 2019-06-08 MED ORDER — PYRIDOSTIGMINE BROMIDE 60 MG PO TABS
60.0000 mg | ORAL_TABLET | Freq: Two times a day (BID) | ORAL | Status: DC
  Administered 2019-06-08: 60 mg via ORAL
  Filled 2019-06-08: qty 1

## 2019-06-08 MED ORDER — OXYCODONE HCL 5 MG PO TABS
5.0000 mg | ORAL_TABLET | ORAL | Status: DC | PRN
  Administered 2019-06-08: 22:00:00 10 mg via ORAL
  Administered 2019-06-08 (×2): 5 mg via ORAL
  Administered 2019-06-09: 10 mg via ORAL
  Administered 2019-06-09: 5 mg via ORAL
  Filled 2019-06-08: qty 2

## 2019-06-08 MED ORDER — MIDAZOLAM HCL 2 MG/2ML IJ SOLN
INTRAMUSCULAR | Status: AC
  Filled 2019-06-08: qty 2

## 2019-06-08 MED ORDER — ONDANSETRON HCL 4 MG/2ML IJ SOLN
4.0000 mg | Freq: Four times a day (QID) | INTRAMUSCULAR | Status: DC | PRN
  Filled 2019-06-08: qty 2

## 2019-06-08 MED ORDER — KETOROLAC TROMETHAMINE 30 MG/ML IJ SOLN
INTRAMUSCULAR | Status: AC
  Filled 2019-06-08: qty 1

## 2019-06-08 MED ORDER — KCL IN DEXTROSE-NACL 20-5-0.45 MEQ/L-%-% IV SOLN
INTRAVENOUS | Status: DC
  Administered 2019-06-08: 50 mL/h via INTRAVENOUS
  Filled 2019-06-08 (×3): qty 1000

## 2019-06-08 MED ORDER — LIDOCAINE 2% (20 MG/ML) 5 ML SYRINGE
INTRAMUSCULAR | Status: DC | PRN
  Administered 2019-06-08: 80 mg via INTRAVENOUS

## 2019-06-08 MED ORDER — HYDROMORPHONE HCL 1 MG/ML IJ SOLN
0.2500 mg | INTRAMUSCULAR | Status: DC | PRN
  Administered 2019-06-08: 0.25 mg via INTRAVENOUS
  Filled 2019-06-08: qty 0.5

## 2019-06-08 MED ORDER — DEXAMETHASONE SODIUM PHOSPHATE 10 MG/ML IJ SOLN
INTRAMUSCULAR | Status: DC | PRN
  Administered 2019-06-08: 5 mg via INTRAVENOUS

## 2019-06-08 MED ORDER — FENTANYL CITRATE (PF) 100 MCG/2ML IJ SOLN
INTRAMUSCULAR | Status: AC
  Filled 2019-06-08: qty 2

## 2019-06-08 MED ORDER — ARTIFICIAL TEARS OPHTHALMIC OINT
TOPICAL_OINTMENT | OPHTHALMIC | Status: AC
  Filled 2019-06-08: qty 3.5

## 2019-06-08 MED ORDER — ROCURONIUM BROMIDE 10 MG/ML (PF) SYRINGE
PREFILLED_SYRINGE | INTRAVENOUS | Status: AC
  Filled 2019-06-08: qty 10

## 2019-06-08 MED ORDER — OXYCODONE HCL 5 MG/5ML PO SOLN
5.0000 mg | Freq: Once | ORAL | Status: DC | PRN
  Filled 2019-06-08: qty 5

## 2019-06-08 MED ORDER — HYDROMORPHONE HCL 1 MG/ML IJ SOLN
1.0000 mg | INTRAMUSCULAR | Status: DC | PRN
  Filled 2019-06-08: qty 1

## 2019-06-08 MED ORDER — ACETAMINOPHEN 500 MG PO TABS
ORAL_TABLET | ORAL | Status: AC
  Filled 2019-06-08: qty 2

## 2019-06-08 MED ORDER — PROPOFOL 10 MG/ML IV BOLUS
INTRAVENOUS | Status: DC | PRN
  Administered 2019-06-08: 160 mg via INTRAVENOUS

## 2019-06-08 MED ORDER — SCOPOLAMINE 1 MG/3DAYS TD PT72
MEDICATED_PATCH | TRANSDERMAL | Status: AC
  Filled 2019-06-08: qty 1

## 2019-06-08 MED ORDER — ONDANSETRON HCL 4 MG/2ML IJ SOLN
INTRAMUSCULAR | Status: DC | PRN
  Administered 2019-06-08: 4 mg via INTRAVENOUS

## 2019-06-08 MED ORDER — SUGAMMADEX SODIUM 200 MG/2ML IV SOLN
INTRAVENOUS | Status: DC | PRN
  Administered 2019-06-08: 200 mg via INTRAVENOUS

## 2019-06-08 SURGICAL SUPPLY — 30 items
ADH SKN CLS APL DERMABOND .7 (GAUZE/BANDAGES/DRESSINGS) ×1
APL PRP STRL LF DISP 70% ISPRP (MISCELLANEOUS) ×1
ATTRACTOMAT 16X20 MAGNETIC DRP (DRAPES) ×3 IMPLANT
BLADE SURG 15 STRL LF DISP TIS (BLADE) ×1 IMPLANT
BLADE SURG 15 STRL SS (BLADE) ×3
CHLORAPREP W/TINT 26 (MISCELLANEOUS) ×3 IMPLANT
CLIP VESOCCLUDE MED 6/CT (CLIP) ×6 IMPLANT
CLIP VESOCCLUDE SM WIDE 6/CT (CLIP) ×6 IMPLANT
COVER WAND RF STERILE (DRAPES) ×3 IMPLANT
DERMABOND ADVANCED (GAUZE/BANDAGES/DRESSINGS) ×2
DERMABOND ADVANCED .7 DNX12 (GAUZE/BANDAGES/DRESSINGS) ×1 IMPLANT
DRAPE LAPAROTOMY T 98X78 PEDS (DRAPES) ×3 IMPLANT
ELECT REM PT RETURN 15FT ADLT (MISCELLANEOUS) ×3 IMPLANT
GAUZE 4X4 16PLY RFD (DISPOSABLE) ×3 IMPLANT
GLOVE SURG ORTHO 8.0 STRL STRW (GLOVE) ×3 IMPLANT
GOWN STRL REUS W/TWL XL LVL3 (GOWN DISPOSABLE) ×8 IMPLANT
HEMOSTAT SURGICEL 2X4 FIBR (HEMOSTASIS) ×5 IMPLANT
ILLUMINATOR WAVEGUIDE N/F (MISCELLANEOUS) ×3 IMPLANT
KIT TURNOVER CYSTO (KITS) ×2 IMPLANT
PACK BASIC VI WITH GOWN DISP (CUSTOM PROCEDURE TRAY) ×1 IMPLANT
PACK BASIN DAY SURGERY FS (CUSTOM PROCEDURE TRAY) ×3 IMPLANT
PENCIL SMOKE EVACUATOR (MISCELLANEOUS) ×2 IMPLANT
SHEARS HARMONIC 9CM CVD (BLADE) ×3 IMPLANT
SUT MNCRL AB 4-0 PS2 18 (SUTURE) ×3 IMPLANT
SUT VIC AB 3-0 SH 18 (SUTURE) ×6 IMPLANT
SYR BULB IRRIGATION 50ML (SYRINGE) ×3 IMPLANT
TOWEL OR 17X26 10 PK STRL BLUE (TOWEL DISPOSABLE) ×3 IMPLANT
TOWEL OR NON WOVEN STRL DISP B (DISPOSABLE) ×3 IMPLANT
TUBING CONNECTING 10 (TUBING) ×2 IMPLANT
TUBING CONNECTING 10' (TUBING) ×1

## 2019-06-08 NOTE — Anesthesia Procedure Notes (Addendum)
Procedure Name: Intubation Date/Time: 06/08/2019 7:39 AM Performed by: Wanita Chamberlain, CRNA Pre-anesthesia Checklist: Patient identified, Timeout performed, Emergency Drugs available, Suction available and Patient being monitored Patient Re-evaluated:Patient Re-evaluated prior to induction Oxygen Delivery Method: Circle system utilized Preoxygenation: Pre-oxygenation with 100% oxygen Induction Type: IV induction Ventilation: Mask ventilation without difficulty Laryngoscope Size: Mac and 3 Grade View: Grade I Tube type: Oral Tube size: 7.0 mm Number of attempts: 1 Airway Equipment and Method: Stylet Placement Confirmation: breath sounds checked- equal and bilateral,  CO2 detector,  positive ETCO2 and ETT inserted through vocal cords under direct vision Secured at: 21 cm Tube secured with: Tape Dental Injury: Dental damage

## 2019-06-08 NOTE — Transfer of Care (Signed)
Immediate Anesthesia Transfer of Care Note  Patient: Chelsey Sanford  Procedure(s) Performed: RIGHT THYROID LOBECTOMY (Right Neck)  Patient Location: PACU  Anesthesia Type:General  Level of Consciousness: awake, alert , oriented and patient cooperative  Airway & Oxygen Therapy: Patient Spontanous Breathing and Patient connected to nasal cannula oxygen  Post-op Assessment: Report given to RN and Post -op Vital signs reviewed and stable  Post vital signs: Reviewed and stable  Last Vitals:  Vitals Value Taken Time  BP    Temp    Pulse 80 06/08/19 0923  Resp 17 06/08/19 0923  SpO2 100 % 06/08/19 0923  Vitals shown include unvalidated device data.  Last Pain:  Vitals:   06/08/19 0610  TempSrc: Oral  PainSc: 0-No pain      Patients Stated Pain Goal: 5 (0000000 [id number])  Complications: No apparent anesthesia complications

## 2019-06-08 NOTE — Interval H&P Note (Signed)
History and Physical Interval Note:  06/08/2019 7:14 AM  Chelsey Sanford  has presented today for surgery, with the diagnosis of Thorp.  The various methods of treatment have been discussed with the patient and family. After consideration of risks, benefits and other options for treatment, the patient has consented to    Procedure(s): RIGHT THYROID LOBECTOMY (Right) as a surgical intervention.    The patient's history has been reviewed, patient examined, no change in status, stable for surgery.  I have reviewed the patient's chart and labs.  Questions were answered to the patient's satisfaction.    Armandina Gemma, MD [facility] Office: [place]

## 2019-06-08 NOTE — Anesthesia Postprocedure Evaluation (Signed)
Anesthesia Post Note  Patient: Tashayla Groden  Procedure(s) Performed: RIGHT THYROID LOBECTOMY (Right Neck)     Patient location during evaluation: PACU Anesthesia Type: General Level of consciousness: awake and alert Pain management: pain level controlled Vital Signs Assessment: post-procedure vital signs reviewed and stable Respiratory status: spontaneous breathing, nonlabored ventilation and respiratory function stable Cardiovascular status: blood pressure returned to baseline and stable Postop Assessment: no apparent nausea or vomiting Anesthetic complications: no    Last Vitals:  Vitals:   06/08/19 1000 06/08/19 1014  BP: (!) 135/95 134/87  Pulse: 71 79  Resp: 13 15  Temp:  36.5 C  SpO2: 94% 98%    Last Pain:  Vitals:   06/08/19 1000  TempSrc:   PainSc: Asleep                 Lynda Rainwater

## 2019-06-08 NOTE — Op Note (Signed)
Procedure Note  Pre-operative Diagnosis:  Thyroid neoplasm of uncertain behavior, hyperthyroidism  Post-operative Diagnosis:  same  Surgeon:  Armandina Gemma, MD  Assistant:  Carlena Hurl, PA-C   Procedure:   Right thyroid lobectomy and isthmusectomy  Anesthesia:  General  Estimated Blood Loss:  minimal  Drains: none         Specimen: thyroid lobe to pathology  Indications:   Patient is referred by Dr. Philemon Kingdom for surgical evaluation and management of a right thyroid nodule with cytologic atypia. Patient's primary care physician is Dr. Billey Gosling. Patient's gynecologist is Dr. Marvel Plan. Patient was first noted by her gynecologist a few years ago to have abnormal thyroid function test. She was evaluated by her primary care physician and eventually referred to endocrinology for evaluation. At the time the patient underwent ultrasound examination and was treated with methimazole for hyperthyroidism. Recently the patient had some change in symptoms. She developed an occasional globus sensation. She underwent repeat thyroid ultrasound in December 2020. This showed a change in size and shape of a right inferior thyroid nodule measuring 1.5 cm in greatest dimension. Fine-needle aspiration biopsy was performed on April 09, 2019. This showed atypia of undetermined significance, Bethesda category III. Subsequent molecular genetic testing, AFIRMA, revealed a suspicious lesion with a risk of malignancy of 50%. Patient is now referred to surgery for consideration for right thyroid lobectomy for definitive diagnosis and management.   Procedure Details: Procedure was done in OR #4 at the [facility]. The patient was brought to the operating room and placed in a supine position on the operating room table. Following administration of general anesthesia, the patient was positioned and then prepped and draped in the usual aseptic fashion. After ascertaining that an adequate  level of anesthesia had been achieved, a small Kocher incision was made with #15 blade. Dissection was carried through subcutaneous tissues and platysma. Hemostasis was achieved with the electrocautery. Skin flaps were elevated cephalad and caudad from the thyroid notch to the sternal notch. A self-retaining retractor was placed for exposure. Strap muscles were incised in the midline and dissection was begun on the right side. Strap muscles were reflected laterally. The right thyroid lobe was normal in size with a palpable nodule in the inferior pole. The lobe was gently mobilized with blunt dissection. Superior pole vessels were dissected out and divided individually between small and medium ligaclips with the harmonic scalpel. The thyroid lobe was rolled anteriorly. Branches of the inferior thyroid artery were divided between small ligaclips with the harmonic scalpel. Inferior venous tributaries were divided between ligaclips. Both the superior and inferior parathyroid glands were identified and preserved on their vascular pedicles. The recurrent laryngeal nerve was identified and preserved along its course. The ligament of Gwenlyn Found was released with the electrocautery and the gland was mobilized onto the anterior trachea. Isthmus was mobilized across the midline. There was a moderate sized pyramidal lobe present which was resected with the isthmus. The thyroid parenchyma was transected at the junction of the isthmus and contralateral thyroid lobe with the harmonic scalpel. The thyroid lobe and isthmus were submitted to pathology for review.  The neck was irrigated with warm saline. Fibrillar was placed throughout the operative field. Strap muscles were approximated in the midline with interrupted 3-0 Vicryl sutures. Platysma was closed with interrupted 3-0 Vicryl sutures. Skin was closed with a running 4-0 Monocryl subcuticular suture.  Wound was washed and dried and Dermabond was applied. The patient was awakened  from anesthesia and  brought to the recovery room. The patient tolerated the procedure well.   Armandina Gemma, MD [facility] Office: [phone]

## 2019-06-08 NOTE — Anesthesia Preprocedure Evaluation (Addendum)
Anesthesia Evaluation  Patient identified by MRN, date of birth, ID band Patient awake    Reviewed: Allergy & Precautions, NPO status , Patient's Chart, lab work & pertinent test results  History of Anesthesia Complications (+) PONV  Airway Mallampati: II  TM Distance: >3 FB Neck ROM: Full    Dental no notable dental hx. (+) Teeth Intact, Dental Advisory Given   Pulmonary neg pulmonary ROS, former smoker,    Pulmonary exam normal breath sounds clear to auscultation       Cardiovascular negative cardio ROS Normal cardiovascular exam Rhythm:Regular Rate:Normal     Neuro/Psych  Headaches, Anxiety negative psych ROS   GI/Hepatic Neg liver ROS, GERD  ,  Endo/Other  Hyperthyroidism   Renal/GU negative Renal ROS  negative genitourinary   Musculoskeletal  (+) Arthritis , Osteoarthritis,    Abdominal   Peds negative pediatric ROS (+)  Hematology negative hematology ROS (+)   Anesthesia Other Findings   Reproductive/Obstetrics negative OB ROS                            Anesthesia Physical Anesthesia Plan  ASA: II  Anesthesia Plan: General   Post-op Pain Management:    Induction: Intravenous  PONV Risk Score and Plan: 4 or greater and Ondansetron, Dexamethasone, Midazolam, Scopolamine patch - Pre-op and Treatment may vary due to age or medical condition  Airway Management Planned: Oral ETT  Additional Equipment:   Intra-op Plan:   Post-operative Plan: Extubation in OR  Informed Consent: I have reviewed the patients History and Physical, chart, labs and discussed the procedure including the risks, benefits and alternatives for the proposed anesthesia with the patient or authorized representative who has indicated his/her understanding and acceptance.     Dental advisory given  Plan Discussed with: CRNA  Anesthesia Plan Comments:         Anesthesia Quick Evaluation

## 2019-06-08 NOTE — Progress Notes (Signed)
Pt with increased bruising to incision site, no significant swelling, no crepitus, facial symmetry noted, pt complains of sore throat but no difficulty swallowing, ice pack intact to wound.  Dr. Harlow Asa made aware, will continue to monitor and ice.

## 2019-06-09 ENCOUNTER — Encounter: Payer: Self-pay | Admitting: Internal Medicine

## 2019-06-09 DIAGNOSIS — G43909 Migraine, unspecified, not intractable, without status migrainosus: Secondary | ICD-10-CM | POA: Diagnosis not present

## 2019-06-09 DIAGNOSIS — Z7952 Long term (current) use of systemic steroids: Secondary | ICD-10-CM | POA: Diagnosis not present

## 2019-06-09 DIAGNOSIS — Z79890 Hormone replacement therapy (postmenopausal): Secondary | ICD-10-CM | POA: Diagnosis not present

## 2019-06-09 DIAGNOSIS — E041 Nontoxic single thyroid nodule: Secondary | ICD-10-CM | POA: Diagnosis not present

## 2019-06-09 DIAGNOSIS — D34 Benign neoplasm of thyroid gland: Secondary | ICD-10-CM | POA: Diagnosis not present

## 2019-06-09 DIAGNOSIS — Z8349 Family history of other endocrine, nutritional and metabolic diseases: Secondary | ICD-10-CM | POA: Diagnosis not present

## 2019-06-09 DIAGNOSIS — K219 Gastro-esophageal reflux disease without esophagitis: Secondary | ICD-10-CM | POA: Diagnosis not present

## 2019-06-09 DIAGNOSIS — Z79899 Other long term (current) drug therapy: Secondary | ICD-10-CM | POA: Diagnosis not present

## 2019-06-09 DIAGNOSIS — E063 Autoimmune thyroiditis: Secondary | ICD-10-CM | POA: Diagnosis not present

## 2019-06-09 DIAGNOSIS — Z87891 Personal history of nicotine dependence: Secondary | ICD-10-CM | POA: Diagnosis not present

## 2019-06-09 LAB — SURGICAL PATHOLOGY

## 2019-06-09 MED ORDER — OXYCODONE HCL 5 MG PO TABS
ORAL_TABLET | ORAL | Status: AC
  Filled 2019-06-09: qty 2

## 2019-06-09 MED ORDER — ONDANSETRON HCL 4 MG PO TABS: 4.0000 mg | ORAL_TABLET | Freq: Three times a day (TID) | ORAL | 0 refills | Status: DC | PRN

## 2019-06-09 MED ORDER — OXYCODONE HCL 5 MG PO TABS
ORAL_TABLET | ORAL | Status: AC
  Filled 2019-06-09: qty 1

## 2019-06-09 MED ORDER — TRAMADOL HCL 50 MG PO TABS: 50.0000 mg | ORAL_TABLET | Freq: Four times a day (QID) | ORAL | 0 refills | Status: DC | PRN

## 2019-06-09 NOTE — Progress Notes (Signed)
Please contact patient and notify of benign pathology results.  Jamielynn Wigley M. Jashawn Floyd, MD, FACS [facility] Office: [phone]   
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
## 2019-06-09 NOTE — Discharge Instructions (Signed)
Post Anesthesia Home Care Instructions  Activity: Get plenty of rest for the remainder of the day. A responsible individual must stay with you for 24 hours following the procedure.  For the next 24 hours, DO NOT: -Drive a car -Paediatric nurse -Drink alcoholic beverages -Take any medication unless instructed by your physician -Make any legal decisions or sign important papers.  Meals: Start with liquid foods such as gelatin or soup. Progress to regular foods as tolerated. Avoid greasy, spicy, heavy foods. If nausea and/or vomiting occur, drink only clear liquids until the nausea and/or vomiting subsides. Call your physician if vomiting continues.  Special Instructions/Symptoms: Your throat may feel dry or sore from the anesthesia or the breathing tube placed in your throat during surgery. If this causes discomfort, gargle with warm salt water. The discomfort should disappear within 24 hours.  If you had a scopolamine patch placed behind your ear for the management of post- operative nausea and/or vomiting:  1. The medication in the patch is effective for 72 hours, after which it should be removed.  Wrap patch in a tissue and discard in the trash. Wash hands thoroughly with soap and water. 2. You may remove the patch earlier than 72 hours if you experience unpleasant side effects which may include dry mouth, dizziness or visual disturbances. 3. Avoid touching the patch. Wash your hands with soap and water after contact with the patch.     Thyroidectomy, Care After This sheet gives you information about how to care for yourself after your procedure. Your health care provider may also give you more specific instructions. If you have problems or questions, contact your health care provider. What can I expect after the procedure? After the procedure, it is common to have:  Mild pain in the neck or upper body, especially when swallowing.  A swollen neck.  A sore throat.  A weak or  hoarse voice.  Slight tingling or numbness around your mouth, or in your fingers or toes. This may last for a day or two after surgery. This condition is caused by low levels of calcium. You may be given calcium supplements to treat it. Follow these instructions at home:  Medicines  Take over-the-counter and prescription medicines only as told by your health care provider.  Do not drive or use heavy machinery while taking prescription pain medicine.  Do not take medicines that contain aspirin and ibuprofen until your health care provider says that you can. These medicines can increase your risk of bleeding.  Take a thyroid hormone medicine as recommended by your health care provider. You will have to take this medicine for the rest of your life if your entire thyroid was removed. Eating and drinking  Start slowly with eating. You may need to have only liquids and soft foods for a few days or as directed by your health care provider.  To prevent or treat constipation while you are taking prescription pain medicine, your health care provider may recommend that you: ? Drink enough fluid to keep your urine pale yellow. ? Take over-the-counter or prescription medicines. ? Eat foods that are high in fiber, such as fresh fruits and vegetables, whole grains, and beans. ? Limit foods that are high in fat and processed sugars, such as fried and sweet foods. Incision care  Follow instructions from your health care provider about how to take care of your incision. Make sure you: ? Wash your hands with soap and water before you change your bandage (dressing). If  soap and water are not available, use hand sanitizer. ? Change your dressing as told by your health care provider. ? Leave stitches (sutures), skin glue, or adhesive strips in place. These skin closures may need to stay in place for 2 weeks or longer. If adhesive strip edges start to loosen and curl up, you may trim the loose edges. Do not  remove adhesive strips completely unless your health care provider tells you to do that.  Check your incision area every day for signs of infection. Check for: ? Redness, swelling, or pain. ? Fluid or blood. ? Warmth. ? Pus or a bad smell.  Do not take baths, swim, or use a hot tub until your health care provider approves. Activity  For the first 10 days after the procedure or as instructed by your health care provider: ? Do not lift anything that is heavier than 10 lb (4.5 kg). ? Do not jog, swim, or do other strenuous exercises. ? Do not play contact sports.  Avoid sitting for a long time without moving. Get up to take short walks every 1-2 hours. This is needed to improve blood flow and breathing. Ask for help if you feel weak or unsteady.  Return to your normal activities as told by your health care provider. Ask your health care provider what activities are safe for you. General instructions  Do not use any products that contain nicotine or tobacco, such as cigarettes and e-cigarettes. These can delay healing after surgery. If you need help quitting, ask your health care provider.  Keep all follow-up visits as told by your health care provider. This is important. Your health care provider needs to monitor the calcium level in your blood to make sure that it does not become low. Contact a health care provider if you:  Have a fever.  Have more redness, swelling, or pain around your incision area.  Have fluid or blood coming from your incision area.  Notice that your incision area feels warm to the touch.  Have pus or a bad smell coming from your incision area.  Have trouble talking.  Have nausea or vomiting for more than 2 days. Get help right away if you:  Have trouble breathing.  Have trouble swallowing.  Develop a rash.  Develop a cough that gets worse.  Notice that your speech changes, or you have hoarseness that gets worse.  Develop numbness, tingling, or  muscle spasms in the arms, hands, feet, or face. Summary  After the procedure, it is common to feel mild pain in the neck or upper body, especially when swallowing.  Take medicines as told by your health care provider. These include pain medicines and thyroid hormones, if required.  Follow instructions from your health care provider about how to take care of your incision. Watch for signs of infection.  Keep all follow-up visits as told by your health care provider. This is important. Your health care provider needs to monitor the calcium level in your blood to make sure that it does not become low.  Get help right away if you develop difficulty breathing, or numbness, tingling, or muscle spasms in the arms, hands, feet, or face. This information is not intended to replace advice given to you by your health care provider. Make sure you discuss any questions you have with your health care provider. Document Revised: 06/12/2018 Document Reviewed: 02/19/2017 Elsevier Patient Education  2020 [employer].

## 2019-06-16 ENCOUNTER — Encounter: Payer: Self-pay | Admitting: Internal Medicine

## 2019-06-16 MED ORDER — TRIAMCINOLONE ACETONIDE 0.1 % EX CREA: 1.0000 "application " | TOPICAL_CREAM | Freq: Two times a day (BID) | CUTANEOUS | 0 refills | Status: DC

## 2019-07-06 ENCOUNTER — Other Ambulatory Visit: Payer: Self-pay | Admitting: Internal Medicine

## 2019-07-11 ENCOUNTER — Encounter: Payer: Self-pay | Admitting: Internal Medicine

## 2019-08-05 ENCOUNTER — Ambulatory Visit: Payer: BC Managed Care – PPO | Admitting: Internal Medicine

## 2019-08-05 ENCOUNTER — Other Ambulatory Visit: Payer: Self-pay

## 2019-08-05 ENCOUNTER — Encounter: Payer: Self-pay | Admitting: Internal Medicine

## 2019-08-05 VITALS — BP 118/80 | HR 59 | Ht 66.5 in | Wt 178.0 lb

## 2019-08-05 DIAGNOSIS — E041 Nontoxic single thyroid nodule: Secondary | ICD-10-CM | POA: Diagnosis not present

## 2019-08-05 DIAGNOSIS — E0500 Thyrotoxicosis with diffuse goiter without thyrotoxic crisis or storm: Secondary | ICD-10-CM

## 2019-08-05 LAB — TSH: TSH: 3.16 u[IU]/mL (ref 0.35–4.50)

## 2019-08-05 LAB — T3, FREE: T3, Free: 3.1 pg/mL (ref 2.3–4.2)

## 2019-08-05 LAB — T4, FREE: Free T4: 0.68 ng/dL (ref 0.60–1.60)

## 2019-08-05 NOTE — Progress Notes (Signed)
Patient ID: Chelsey Sanford, female   DOB: 11/30/1975, 44 y.o.   MRN: [id number]   This visit occurred during the SARS-CoV-2 public health emergency.  Safety protocols were in place, including screening questions prior to the visit, additional usage of staff PPE, and extensive cleaning of exam room while observing appropriate contact time as indicated for disinfecting solutions.   HPI  Chelsey Sanford is a 44 y.o.-year-old female, returning for follow-up for subclinical thyrotoxicosis (likely Graves' disease).  Last visit 6 months ago.  Reviewed and addended history: Patient was found to have a low TSH on her annual physical exam in 11/2016. She was getting annual TFTs until then due to a family history of hypothyroidism in her mother.  The rest of her TSH levels have been normal since 2008.  Her thyroid tests initially improved in 02/2017, but the latest set of tests worsened again (subclinical thyrotoxicosis).  We started 5 mg of methimazole daily in 05/2017.  She started to feel better afterwards but she was lost for follow-up for 1 year and 7 months after our last visit 6 months ago.  Since last OV, she had R thyroidectomy in 06/2019 for a thyroid nodule with inconclusive Bx. Final pathology benign.  She stopped MMI after her thyroid surgery.  Reviewed her TFTs: Lab Results  Component Value Date   TSH 2.17 05/06/2019   TSH 2.58 02/27/2019   TSH 3.02 12/24/2017   TSH 0.18 (L) 07/24/2017   TSH 0.01 (L) 06/19/2017   TSH 0.28 (L) 03/07/2017   TSH 0.01 (L) 12/14/2016   TSH 2.03 09/07/2015   TSH 1.84 05/16/2015   TSH 2.57 07/16/2014   FREET4 1.13 05/06/2019   FREET4 0.87 02/27/2019   FREET4 0.72 12/24/2017   FREET4 0.86 07/24/2017   FREET4 1.20 06/19/2017   FREET4 0.82 03/07/2017   FREET4 0.86 12/26/2016   FREET4 0.80 06/18/2013   T3FREE 3.5 05/06/2019   T3FREE 3.3 02/27/2019   T3FREE 3.5 12/24/2017   T3FREE 3.2 07/24/2017   T3FREE 5.2 (H) 06/19/2017   T3FREE 3.5 03/07/2017   T3FREE  3.8 12/26/2016   Her TPO antibodies were elevated: Component     Latest Ref Rng & Units 12/26/2016  Thyroperoxidase Ab SerPl-aCnc     <9 IU/mL 129 (H)  Thyroglobulin Ab     <2 IU/mL 1   Her Graves' antibodies were elevated: Lab Results  Component Value Date   TSI 441 (H) 03/07/2017   We obtained a thyroid uptake and scan (04/03/2017): Uniform scan with the exception of a small right cold nodule, uptake at the upper limit of normal, 29%.  This was consistent with possible mild Graves' disease.  We then checked a thyroid ultrasound (04/15/2017): Small, 1.2 cm isoechoic solid nodule in the right lobe.  Also, small, subcentimeter echogenic left nodules.  None of the nodules meet criteria for biopsy or follow-up.  We repeated a thyroid ultrasound (04/01/2019): Her dominant right thyroid nodule has increased (1.5 cm X 1.5 x 0.7 cm) in size and risk.  Biopsy was recommended.  FNA (04/09/2019): Inconclusive: Atypia of undetermined significance (Bethesda category III); Afirma molecular marker: Suspicious (50% cancer risk)  I referred her to surgery and she had Right thyroid lobectomy (06/08/2019): Benign nodule, lymphocytic thyroiditis  Pt denies: - feeling nodules in neck - hoarseness - dysphagia - choking - SOB with lying down  Pt does have a FH of thyroid ds. In Mother >> hypothyroidism. No FH of thyroid cancer. No h/o radiation tx to head or neck.  No seaweed or kelp. No recent contrast studies. No herbal supplements. No Biotin use. No recent steroids use.   Pt. also has a history of POTS dx 2008 - managed by cardiology.  Was on Florinef and Mestinon >> she is now off the Florinef. On Pindolol. She also has B12 and vitamin D deficiency She has longstanding anemia. Also, eosinophilic esophagitis and hiatal hernia >> on Protonix.  ROS: Constitutional: + weight gain/no weight loss, + fatigue, no subjective hyperthermia, no subjective hypothermia Eyes: no blurry vision, no  xerophthalmia ENT: no sore throat, + see HPI Cardiovascular: no CP/no SOB/no palpitations/no leg swelling Respiratory: no cough/no SOB/no wheezing Gastrointestinal: no N/no V/no D/no C/no acid reflux Musculoskeletal: + Muscle aches/+ joint aches Skin: no rashes, + hair loss Neurological: no tremors/no numbness/no tingling/no dizziness  I reviewed pt's medications, allergies, PMH, social hx, family hx, and changes were documented in the history of present illness. Otherwise, unchanged from my initial visit note.  Past Medical History:  Diagnosis Date  . ALLERGIC RHINITIS   . ANEMIA-NOS   . Anxiety   . Complication of anesthesia   . Delayed gastric emptying    dx at [facility] by gastric emptying study per patient  . DYSAUTONOMIA   . ENDOMETRIOSIS   . GERD (gastroesophageal reflux disease) 10/06/2011  . Headache(784.0)   . Irritable bowel syndrome   . Lumbar disc disease   . Migraine    since 6th grade  . PONV (postoperative nausea and vomiting)    scope patch works well  . POTS (postural orthostatic tachycardia syndrome)    not triggered by anesthesia. usually triggered by dehydration   Past Surgical History:  Procedure Laterality Date  . ANKLE RECONSTRUCTION  1993  . ELBOW SURGERY Right 2015  . SINUS EXPLORATION  2015  . THYROID LOBECTOMY Right 06/08/2019   Procedure: RIGHT THYROID LOBECTOMY;  Surgeon: Armandina Gemma, MD;  Location: [facility];  Service: General;  Laterality: Right;  . TONSILLECTOMY  1996  . WISDOM TOOTH EXTRACTION     Social History   Socioeconomic History  . Marital status: Married    Spouse name: [name]  . Number of children: 1  . Years of education: 29  . Highest education level: Not on file  Occupational History  . Occupation: Retail banker: Self Employed    Comment: [employer]  Tobacco Use  . Smoking status: Former Smoker    Last attempt to quit: 04/30/1996    Years since quitting: 20.8  .  Smokeless tobacco: Never Used  Substance and Sexual Activity  . Alcohol use: Yes    Comment: 1 time weekly - 2 drinks of winr  . Drug use: No  . Sexual activity: Yes    Birth control/protection: Pill  Other Topics Concern  . 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 cetirizine (ZYRTEC) 10 MG tablet Take 10 mg by mouth daily.    . Cyanocobalamin 500 MCG/0.1ML SOLN 1 spray in one nostril qwk (Patient taking differently: Place 500 mcg into alternate nostrils once a week. 1 spray in one nostril qwk) 3 Bottle 3  . diphenhydrAMINE (BENADRYL)  25 MG tablet Take 50 mg by mouth every 6 (six) hours as needed for itching or allergies.    Marland Kitchen docusate sodium (COLACE) 100 MG capsule Take 100 mg by mouth daily as needed for mild constipation.    . Lactobacillus-Inulin (CULTURELLE DIGESTIVE HEALTH PO) Take 1 tablet by mouth daily.     . norethindrone (NORLYDA) 0.35 MG tablet Take 1 tablet by mouth daily.     . ondansetron (ZOFRAN) 4 MG tablet Take 1 tablet (4 mg total) by mouth every 8 (eight) hours as needed for nausea or vomiting. 10 tablet 0  . pantoprazole (PROTONIX) 40 MG tablet TAKE 1 TABLET BY MOUTH DAILY. Need appointment for more refills. 30 tablet 0  . pindolol (VISKEN) 5 MG tablet TAKE 1 TABLET(5 MG) BY MOUTH TWICE DAILY 180 tablet 2  . predniSONE (DELTASONE) 20 MG tablet Take 2 tablets (40 mg total) by mouth daily with breakfast. (Patient not taking: Reported on 06/02/2019) 10 tablet 0  . pyridostigmine (MESTINON) 60 MG tablet Take 1 tablet (60 mg total) by mouth 2 (two) times daily. Please make yearly appt with Dr. Harrington Challenger for January before anymore refills. 1st attempt 180 tablet 0  . rizatriptan (MAXALT-MLT)  10 MG disintegrating tablet Take 1 tablet (10 mg total) by mouth as needed for migraine. May repeat in 2 hours if needed 9 tablet 2  . traMADol (ULTRAM) 50 MG tablet Take 1-2 tablets (50-100 mg total) by mouth every 6 (six) hours as needed for moderate pain. 15 tablet 0  . triamcinolone cream (KENALOG) 0.1 % Apply 1 application topically 2 (two) times daily. 30 g 0  . [DISCONTINUED] magnesium oxide (MAG-OX) 400 MG tablet Take 1 tablet (400 mg total) by mouth daily. 30 tablet 5   No current facility-administered medications on file prior to visit.   Allergies  Allergen Reactions  . Adhesive [Tape] Rash and Other (See Comments)    Skin blistered and disintegrated after epidural (looked like 3rd degree burn) - possibly due to tape and/or tegaderm - do not use anything with adhesive  . Sulfonamide Derivatives Nausea And Vomiting  . Doxycycline Nausea And Vomiting  . Sulfa Antibiotics Nausea And Vomiting  . Amoxicillin Nausea And Vomiting and Rash  . Erythromycin Nausea And Vomiting and Rash  . Latex Rash    See notes under adhesive tape  . Penicillins Nausea And Vomiting and Rash    Has patient had a PCN reaction causing immediate rash, facial/tongue/throat swelling, SOB or lightheadedness with hypotension: Yes Has patient had a PCN reaction causing severe rash involving mucus membranes or skin necrosis: No Has patient had a PCN reaction that required hospitalization: No Has patient had a PCN reaction occurring within the last 10 years: No If all of the above answers are "NO", then may proceed with Cephalosporin use.   Family History  Problem Relation Age of Onset  . Thyroid disease Mother        hypothyroidism  . Migraines Mother   . Colon polyps Mother   . Skin cancer Mother        malignant melanoma  . Crohn's disease Maternal Grandmother   . Parkinson's disease Paternal Grandmother   . Arthritis/Rheumatoid Paternal Grandmother   . Cancer Paternal Grandfather        skin  .  Parkinson's disease Paternal Grandfather   . COPD Neg Hx   . Colon cancer Neg Hx     PE: BP 118/80 (BP Location: Right Arm, Patient Position: Sitting, Cuff Size:  Normal)   Pulse (!) 59   Ht 5' 6.5" (1.689 m)   Wt 178 lb (80.7 kg)   SpO2 96%   BMI 28.30 kg/m  Wt Readings from Last 3 Encounters:  08/05/19 178 lb (80.7 kg)  06/08/19 178 lb 6.4 oz (80.9 kg)  06/03/19 176 lb 4 oz (79.9 kg)   Constitutional: overweight, in NAD Eyes: PERRLA, EOMI, no exophthalmos ENT: moist mucous membranes, no neck masses, surgical scar healed, no cervical lymphadenopathy Cardiovascular: RRR, No MRG Respiratory: CTA B Gastrointestinal: abdomen soft, NT, ND, BS+ Musculoskeletal: no deformities, strength intact in all 4 Skin: moist, warm, no rashes Neurological: no tremor with outstretched hands, DTR normal in all 4  ASSESSMENT: 1.  Graves' disease - Thyroid uptake and scan (04/03/2017): Upper normal 24 hour radio iodine uptake of 29%. Tiny cold nodule at inferior pole of RIGHT thyroid lobe without dominant thyroid mass.  2.  Small thyroid nodule - Thyroid ultrasound (04/15/2017): Parenchymal Echotexture: Moderately heterogenous Isthmus: 0.3 cm Right lobe: 4.2 x 1.4 x 1.7 cm Left lobe: 4.2 x 1.4 x 1.6 cm _________________________________________________________ Nodule # 1: Location: Right; Inferior Maximum size: 1.2 cm; Other 2 dimensions: 0.8 x 0.8 cm Composition: solid/almost completely solid (2) Echogenicity: isoechoic (1) Given size (<1.4 cm) and appearance, this nodule does NOT meet TI-RADS criteria for biopsy or dedicated follow-up. __________________________________________  An additional small, subcentimeter echogenic nodules present in the left mid gland. This nodule does not meet criteria for further evaluation.  IMPRESSION: Bilateral thyroid nodules which do not meet criteria for further evaluation. No further follow-up is required.  Thyroid U/S (04/01/2019): Narrative &  Impression    CLINICAL DATA:  Other.  Dysphagia.  Follow-up thyroid nodules.  COMPARISON:  04/15/2017  FINDINGS: Parenchymal Echotexture: Mildly heterogenous Isthmus: Normal in size measures 0.5 cm in diameter, unchanged Right lobe: Normal in size measuring 4.3 x 1.3 x 1.8 cm, unchanged, previously, 4.2 x 1.4 x 1.7 cm Left lobe: Normal in size measuring 4.4 x 1.6 x 1.7 cm, unchanged, previously, 4.2 x 1.4 x 1.6 cm ___________________________________  Nodule # 1: Prior biopsy: No Location: Right; Inferior Maximum size: 1.5 cm; Other 2 dimensions: 1.5 x 0.7 cm, previously, 1.3 x 1.1 x 0.7 cm Composition: solid/almost completely solid (2) Echogenicity: hypoechoic (2) Significant change in size (>/= 20% in two dimensions and minimal increase of 2 mm): Yes Change in features: Yes nodule now appears hypoechoic, previously, isoechoic Change in ACR TI-RADS risk category: Yes previously a TR3 nodule, currently a TR4 nodule.  **Given size (>/= 1.5 cm) and appearance, fine needle aspiration of this moderately suspicious nodule should be considered based on TI-RADS criteria. _________________________________________________________  The approximately 0.8 cm isoechoic ill-defined nodule/pseudonodule within the mid aspect the right lobe of the thyroid labeled 2), is unchanged compared to the 03/2017 examination, previously, 0.7 cm, and again does not meet imaging criteria to recommend percutaneous sampling or continued dedicated follow-up.  IMPRESSION: Nodule #1 within the inferior pole the right lobe of the thyroid has increased in size and now appears hypoechoic and as such has been up graded from a TR3 nodule to a TR4 nodule and now meets imaging criteria to recommend percutaneous sampling as indicated.  Electronically Signed   By: Sandi Mariscal M.D.   On: 04/02/2019 08:48     04/09/2019: FNA: Atypia of undetermined significance (Bethesda category III); Afirma: Suspicious (50%  cancer risk)  06/08/2019: Right thyroid lobectomy: Adenomatous nodule, lymphocytic thyroiditis  PLAN:  1. Patient with history of subclinical  thyrotoxicosis with elevated TSI antibodies, consistent with mild Graves' disease.  We checked a thyroid uptake and scan and the scan was uniform with exception of a small cold nodule in the right lobe of the thyroid.  The uptake was at the upper limit of normal.  At the time of the scan, her TSH is slightly low, but this worsened afterwards.  We started a low-dose methimazole, 5 mg daily.  She tolerated this well and felt a little better on it.  Dr. Harlow Asa advised her to start this after her right thyroidectomy (see next problem), performed for enlarging, compressing, cold thyroid nodule.  The nodule turned out to be benign. -Latest TSH was reviewed and this was normal in 05/2019, before the surgery -At last visit, she denied fatigue but continues to feel palpitations (she is on chronic beta-blockers for her POTS) and also complaining of weight gain.  She now again complains of weight gain, however, looking back at her weight from last visit, this is identical to the one today.  -As of now, she is off methimazole for now and  we discussed that her mild thyrotoxicosis may have resolved after her right thyroidectomy -We will check her TFTs today  -I did explain about how to take levothyroxine correctly, in case we need to start -We will see her back in 6 months.  2.  Cold thyroid nodule -Previously with neck compression symptoms: Food getting stuck in her throat -Repeated ultrasound showed an increased size of the nodule.  This was cold on the uptake and scan. -Inconclusive biopsy with suspicious molecular marker (Afirma) -She is now status post right hemithyroidectomy in 06/2019 and the final pathology was benign -We will check her TFTs today  Component     Latest Ref Rng & Units 08/05/2019  TSH     0.35 - 4.50 uIU/mL 3.16  Triiodothyronine,Free,Serum      2.3 - 4.2 pg/mL 3.1  T4,Free(Direct)     0.60 - 1.60 ng/dL 0.68  Thyroid tests are normal.  No intervention needed for now.  We will continue off methimazole and repeat her tests in 1.5 months to ensure stability.  Philemon Kingdom, MD PhD [facility]

## 2019-08-05 NOTE — Patient Instructions (Signed)
Please continue off Methimazole.  Please stop at the lab.  Please come back for a follow-up appointment in 6 months.

## 2019-08-07 ENCOUNTER — Other Ambulatory Visit: Payer: Self-pay | Admitting: Internal Medicine

## 2019-09-01 ENCOUNTER — Other Ambulatory Visit: Payer: BC Managed Care – PPO

## 2019-09-17 ENCOUNTER — Other Ambulatory Visit (INDEPENDENT_AMBULATORY_CARE_PROVIDER_SITE_OTHER): Payer: BC Managed Care – PPO

## 2019-09-17 ENCOUNTER — Encounter: Payer: Self-pay | Admitting: Internal Medicine

## 2019-09-17 ENCOUNTER — Other Ambulatory Visit: Payer: Self-pay

## 2019-09-17 DIAGNOSIS — E0500 Thyrotoxicosis with diffuse goiter without thyrotoxic crisis or storm: Secondary | ICD-10-CM

## 2019-09-17 LAB — T3, FREE: T3, Free: 3.1 pg/mL (ref 2.3–4.2)

## 2019-09-17 LAB — TSH: TSH: 2.77 u[IU]/mL (ref 0.35–4.50)

## 2019-09-17 LAB — T4, FREE: Free T4: 0.78 ng/dL (ref 0.60–1.60)

## 2019-09-29 ENCOUNTER — Other Ambulatory Visit: Payer: Self-pay | Admitting: Internal Medicine

## 2019-10-06 ENCOUNTER — Other Ambulatory Visit: Payer: Self-pay | Admitting: Internal Medicine

## 2019-11-11 ENCOUNTER — Other Ambulatory Visit: Payer: Self-pay | Admitting: Internal Medicine

## 2019-12-07 ENCOUNTER — Encounter: Payer: Self-pay | Admitting: Internal Medicine

## 2019-12-09 ENCOUNTER — Ambulatory Visit: Payer: BC Managed Care – PPO | Admitting: Internal Medicine

## 2019-12-09 ENCOUNTER — Other Ambulatory Visit: Payer: Self-pay

## 2019-12-09 ENCOUNTER — Encounter: Payer: Self-pay | Admitting: Internal Medicine

## 2019-12-09 VITALS — BP 128/78 | HR 69 | Temp 98.3°F | Resp 16 | Ht 66.5 in | Wt 178.0 lb

## 2019-12-09 DIAGNOSIS — Z23 Encounter for immunization: Secondary | ICD-10-CM

## 2019-12-09 DIAGNOSIS — B070 Plantar wart: Secondary | ICD-10-CM | POA: Diagnosis not present

## 2019-12-09 NOTE — Progress Notes (Signed)
Subjective:  Patient ID: Rachana Malesky, female    DOB: [birth year]  Age: 44 y.o. MRN: [id number]  CC: Abrasion  This visit occurred during the SARS-CoV-2 public health emergency.  Safety protocols were in place, including screening questions prior to the visit, additional usage of staff PPE, and extensive cleaning of exam room while observing appropriate contact time as indicated for disinfecting solutions.    HPI Novia Lansberry presents for concerns about a 2 month hx of lesion on the left heel - it is at time painful and bleeds rarely.  Outpatient Medications Prior to Visit  Medication Sig Dispense Refill  . cetirizine (ZYRTEC) 10 MG tablet Take 10 mg by mouth daily.    . Cyanocobalamin 500 MCG/0.1ML SOLN 1 spray in one nostril qwk (Patient taking differently: Place 500 mcg into alternate nostrils once a week. 1 spray in one nostril qwk) 3 Bottle 3  . diphenhydrAMINE (BENADRYL) 25 MG tablet Take 50 mg by mouth every 6 (six) hours as needed for itching or allergies.    Marland Kitchen docusate sodium (COLACE) 100 MG capsule Take 100 mg by mouth daily as needed for mild constipation.    . Lactobacillus-Inulin (CULTURELLE DIGESTIVE HEALTH PO) Take 1 tablet by mouth daily.     . pantoprazole (PROTONIX) 40 MG tablet TAKE 1 TABLET BY MOUTH DAILY 90 tablet 0  . pindolol (VISKEN) 5 MG tablet Take 1 tablet (5 mg total) by mouth 2 (two) times daily. Pt needs to make appt with provider for further refills - 2nd attempt 60 tablet 0  . pyridostigmine (MESTINON) 60 MG tablet Take 1 tablet (60 mg total) by mouth 2 (two) times daily. Pt needs to make appt with provider for further refills - 2nd attempt 60 tablet 0  . rizatriptan (MAXALT-MLT) 10 MG disintegrating tablet Take 1 tablet (10 mg total) by mouth as needed for migraine. May repeat in 2 hours if needed 9 tablet 2   No facility-administered medications prior to visit.    ROS Review of Systems  All other systems reviewed and are negative.   Objective:  BP  128/78 (BP Location: Left Arm, Patient Position: Sitting, Cuff Size: Large)   Pulse 69   Temp 98.3 F (36.8 C) (Oral)   Resp 16   Ht 5' 6.5" (1.689 m)   Wt 178 lb (80.7 kg)   SpO2 99%   BMI 28.30 kg/m   BP Readings from Last 3 Encounters:  12/09/19 128/78  08/05/19 118/80  06/09/19 120/82    Wt Readings from Last 3 Encounters:  12/09/19 178 lb (80.7 kg)  08/05/19 178 lb (80.7 kg)  06/08/19 178 lb 6.4 oz (80.9 kg)    Physical Exam Musculoskeletal:       Feet:     Lab Results  Component Value Date   WBC 8.9 06/03/2019   HGB 14.0 06/03/2019   HCT 41.5 06/03/2019   PLT 283 06/03/2019   GLUCOSE 100 (H) 05/27/2018   CHOL 227 (H) 05/27/2018   TRIG 121.0 05/27/2018   HDL 57.40 05/27/2018   LDLCALC 146 (H) 05/27/2018   ALT 10 05/27/2018   AST 14 05/27/2018   NA 137 05/27/2018   K 4.2 05/27/2018   CL 101 05/27/2018   CREATININE 0.76 05/27/2018   BUN 10 05/27/2018   CO2 28 05/27/2018   TSH 2.77 09/17/2019   INR 0.9 09/14/2011    Liquid nitrogen was applied for 10-12 seconds to the skin lesion. 5 cycles applied. The expected blistering or  scabbing reaction explained. She tolerated this with minimal discomfort..  Assessment & Plan:   Loeta was seen today for abrasion.  Diagnoses and all orders for this visit:  Plantar wart, left foot- Cryotherapy applied. HPV vaccination given.  If this combination dose not destroy the wart then she will RTC in 3-4 for biopsy of the lesion. -     HPV 9-valent vaccine,Recombinat  Need for HPV vaccination -     HPV 9-valent vaccine,Recombinat   I am having [name] maintain her Lactobacillus-Inulin ([place] PO), diphenhydrAMINE, rizatriptan, Cyanocobalamin, cetirizine, docusate sodium, pyridostigmine, pantoprazole, and pindolol.  No orders of the defined types were placed in this encounter.    Follow-up: Return in about 4 weeks (around 01/06/2020).  Scarlette Calico, MD

## 2019-12-09 NOTE — Patient Instructions (Signed)
Warts ° °Warts are small growths on the skin. They are common and can occur on many areas of the body. A person may have one wart or several warts. °In many cases, warts do not require treatment. They usually go away on their own over a period of many months to a few years. If needed, warts that cause problems or do not go away on their own can be treated. °What are the causes? °Warts are caused by a type of virus that is called human papillomavirus (HPV). °· This virus can spread from person to person through direct contact. °· Warts can also spread to other areas of the body when a person scratches a wart and then scratches another area of his or her body. °What increases the risk? °You are more likely to develop this condition if: °· You are 10-20 years old. °· You have a weakened body defense system (immune system). °· You are Caucasian. °What are the signs or symptoms? °The main symptom of this condition is small growths on the skin. Warts may: °· Be round or oval or have an irregular shape. °· Have a rough surface. °· Range in color from skin color to light yellow, brown, or gray. °· Generally be less than ½ inch (1.3 cm) in size. °· Go away and then come back again. °Most warts are painless, but some can be painful if they are large or occur in an area of the body where pressure will be applied to them, such as the bottom of the foot. °How is this diagnosed? °A wart can usually be diagnosed based on its appearance. In some cases, a tissue sample may be removed (biopsy) to be looked at under a microscope. °How is this treated? °In many cases, warts do not need treatment. Sometimes treatment is desired. If treatment is needed or desired, options may include: °· Applying medicated solutions, creams, or patches to the wart. These may be over-the-counter or prescription medicines that make the skin soft so that layers will gradually shed away. In many cases, the medicine is applied one or two times per day and  covered with a bandage. °· Putting duct tape over the top of the wart (occlusion). You will leave the tape in place for as long as told by your health care provider and then replace it with a new strip of tape. This is done until the wart goes away. °· Freezing the wart with liquid nitrogen (cryotherapy). °· Burning the wart with: °? Laser treatment. °? An electrified probe (electrocautery). °· Injection of a medicine (Candida antigen) into the wart to help the body's immune system fight off the wart. °· Surgery to remove the wart. °Follow these instructions at home: °Medicines °· Apply over-the-counter and prescription medicines only as told by your health care provider. °· Do not apply over-the-counter wart medicines to your face or genitals unless your health care provider tells you to do that. °Lifestyle °· Keep your immune system healthy. To do this: °? Eat a healthy, balanced diet. °? Get enough sleep. °? Do not use any products that contain nicotine or tobacco, such as cigarettes and e-cigarettes. If you need help quitting, ask your health care provider. °General instructions ° °· Wash your hands after you touch a wart. °· Do not scratch or pick at a wart. °· Avoid shaving hair that is over a wart. °· Keep all follow-up visits as told by your health care provider. This is important. °Contact a health care provider if: °·   Your warts do not improve after treatment. °· You have redness, swelling, or pain at the site of a wart. °· You have bleeding from a wart that does not stop with light pressure. °· You have diabetes and you develop a wart. °Summary °· Warts are small growths on the skin. They are common and can occur on many areas of the body. °· In many cases, warts do not need treatment. Sometimes treatment is desired. If treatment is needed or desired, there are several treatment options. °· Apply over-the-counter and prescription medicines only as told by your health care provider. °· Wash your hands  after you touch a wart. °· Keep all follow-up visits as told by your health care provider. This is important. °This information is not intended to replace advice given to you by your health care provider. Make sure you discuss any questions you have with your health care provider. °Document Revised: 09/03/2017 Document Reviewed: 09/03/2017 °Elsevier Patient Education © 2020 [employer] ° °

## 2019-12-22 DIAGNOSIS — M542 Cervicalgia: Secondary | ICD-10-CM | POA: Diagnosis not present

## 2019-12-31 DIAGNOSIS — R030 Elevated blood-pressure reading, without diagnosis of hypertension: Secondary | ICD-10-CM | POA: Diagnosis not present

## 2019-12-31 DIAGNOSIS — M542 Cervicalgia: Secondary | ICD-10-CM | POA: Diagnosis not present

## 2019-12-31 DIAGNOSIS — Z6830 Body mass index (BMI) 30.0-30.9, adult: Secondary | ICD-10-CM | POA: Diagnosis not present

## 2020-01-12 ENCOUNTER — Other Ambulatory Visit: Payer: Self-pay | Admitting: Internal Medicine

## 2020-01-14 ENCOUNTER — Ambulatory Visit (INDEPENDENT_AMBULATORY_CARE_PROVIDER_SITE_OTHER): Payer: BC Managed Care – PPO

## 2020-01-14 ENCOUNTER — Other Ambulatory Visit: Payer: Self-pay

## 2020-01-14 DIAGNOSIS — Z23 Encounter for immunization: Secondary | ICD-10-CM | POA: Diagnosis not present

## 2020-01-14 NOTE — Progress Notes (Signed)
Pt here for 2nd HPV vaccination per Dr Ronnald Ramp.  Pt tolerated well. Wart on foot not noticeable, so no f/u appt made with Dr Ronnald Ramp.  Does pt need to schedule for 3rd HPV vaccination or is this fine?  Pt c/o insect bite on right breast that she noticed x 5days ago. Redness (approx 4-5" diameter) noted with 3 marks on inside.  Appt made with Dr Quay Burow for 9/17 to assess.

## 2020-01-14 NOTE — Progress Notes (Signed)
Subjective:    Patient ID: Chelsey Sanford, female    DOB: 1975-12-03, 44 y.o.   MRN: [id number]  HPI The patient is here for an acute visit.   She noticed three tiny red marks on the lateral right breat 1 week ago.  The night before she was sitting out on her porch and she thinks she may have been bitten by something but she did not feel anything at that time.  She noticed them because they were very itchy.  The area is red and the redness is getting larger.  The itching it intermittent.  She has minimal tenderness.  She is worried about infection.  She has not had any fevers or chills.  She is unsure what could have bitten her.     Medications and allergies reviewed with patient and updated if appropriate.  Patient Active Problem List   Diagnosis Date Noted  . Plantar wart, left foot 12/09/2019  . 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 Right thyroid nodule 06/07/2019  . Lower back pain 05/07/2019  . Pre-operative clearance 05/04/2018  . Hyperthyroidism 12/09/2017  . Photodermatitis due to sun 07/24/2017  . Cold thyroid nodule 04/05/2017  . Graves' disease 03/07/2017  . Fatigue 12/14/2016  . 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 Photosensitivity 09/04/2016  . Vitamin D deficiency 08/13/2016  . 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 Dysautonomia ([place]) 05/16/2015  . B12 deficiency 05/16/2015  . Superficial phlebitis 04/29/2013  . Contact dermatitis 09/03/2012  . Migraine headache   . GERD (gastroesophageal reflux disease) 10/06/2011  . Vertigo 10/05/2011  . Anxiety state 05/03/2010  . ALLERGIC RHINITIS 05/03/2010  . POTS (postural orthostatic tachycardia syndrome) 12/26/2007  . Irritable bowel syndrome 09/08/2007  . ENDOMETRIOSIS 07/21/2007    Current Outpatient Medications on File Prior to Visit  Medication Sig Dispense Refill  . cetirizine (ZYRTEC) 10 MG tablet Take 10 mg by mouth daily.    . Cyanocobalamin 500  MCG/0.1ML SOLN 1 spray in one nostril qwk (Patient taking differently: Place 500 mcg into alternate nostrils once a week. 1 spray in one nostril qwk) 3 Bottle 3  . diphenhydrAMINE (BENADRYL) 25 MG tablet Take 50 mg by mouth every 6 (six) hours as needed for itching or allergies.    Marland Kitchen docusate sodium (COLACE) 100 MG capsule Take 100 mg by mouth daily as needed for mild constipation.    . Lactobacillus-Inulin (CULTURELLE DIGESTIVE HEALTH PO) Take 1 tablet by mouth daily.     . pantoprazole (PROTONIX) 40 MG tablet TAKE 1 TABLET BY MOUTH DAILY 90 tablet 0  . pindolol (VISKEN) 5 MG tablet Take 1 tablet (5 mg total) by mouth 2 (two) times daily. Pt needs to make appt with provider for further refills - 2nd attempt 60 tablet 0  . pyridostigmine (MESTINON) 60 MG tablet Take 1 tablet (60 mg total) by mouth 2 (two) times daily. Please make overdue appt with Dr. Harrington Challenger before anymore refills. 3rd and Final Attempt 30 tablet 0  . rizatriptan (MAXALT-MLT) 10 MG disintegrating tablet TAKE 1 TABLET BY MOUTH AS NEEDED FOR MIGRAINE, MAY REPEAT IN 2 HOURS IF NEEDED 9 tablet 2  . [DISCONTINUED] magnesium oxide (MAG-OX) 400 MG tablet Take 1 tablet (400 mg total) by mouth daily. 30 tablet 5   No current facility-administered medications on file prior to visit.    Past Medical History:  Diagnosis Date  . ALLERGIC RHINITIS   . ANEMIA-NOS   . Anxiety   .  Complication of anesthesia   . Delayed gastric emptying    dx at [facility] by gastric emptying study per patient  . DYSAUTONOMIA   . ENDOMETRIOSIS   . GERD (gastroesophageal reflux disease) 10/06/2011  . Headache(784.0)   . Irritable bowel syndrome   . Lumbar disc disease   . Migraine    since 6th grade  . PONV (postoperative nausea and vomiting)    scope patch works well  . POTS (postural orthostatic tachycardia syndrome)    not triggered by anesthesia. usually triggered by dehydration    Past Surgical History:  Procedure Laterality Date  . ANKLE RECONSTRUCTION   1993  . ELBOW SURGERY Right 2015  . SINUS EXPLORATION  2015  . THYROID LOBECTOMY Right 06/08/2019   Procedure: RIGHT THYROID LOBECTOMY;  Surgeon: Armandina Gemma, MD;  Location: [facility];  Service: General;  Laterality: Right;  . TONSILLECTOMY  1996  . WISDOM TOOTH EXTRACTION      Social History   Socioeconomic History  . Marital status: Married    Spouse name: [name]  . Number of children: 1  . Years of education: 32  . Highest education level: Not on file  Occupational History  . Occupation: Retail banker: Self Employed    Comment: [employer]  Tobacco Use  . Smoking status: Former Smoker    Quit date: 04/30/1996    Years since quitting: 23.7  . Smokeless tobacco: Never Used  Vaping Use  . Vaping Use: Never used  Substance and Sexual Activity  . Alcohol use: Yes    Comment: several times weekly  . Drug use: No  . Sexual activity: Yes    Birth control/protection: Pill  Other Topics Concern  . 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 Difficulty of Paying Living Expenses: Not on file  Food Insecurity:   . Worried About Charity fundraiser in the Last Year: Not on file  . Ran Out of Food in the Last Year: Not on file  Transportation Needs:   . Lack of Transportation (Medical): Not on file  . Lack of Transportation (Non-Medical): Not on file  Physical Activity:   . Days of Exercise per Week: Not on file  . Minutes of Exercise per Session: Not on file  Stress:   . Feeling of Stress : Not on file  Social Connections:   . Frequency of Communication with Friends and Family: Not on file  . Frequency of Social Gatherings with Friends and Family: Not on  file  . Attends Religious Services: Not on file  . Active Member of Clubs or Organizations: Not on file  . Attends Archivist Meetings: Not on file  . Marital Status: Not on file    Family History  Problem Relation Age of Onset  . Thyroid disease Mother        hypothyroidism  . Migraines Mother   . Colon polyps Mother   . Skin cancer Mother        malignant melanoma  . Crohn's disease Maternal Grandmother   . Parkinson's disease Paternal Grandmother   . Arthritis/Rheumatoid Paternal Grandmother   . Cancer  Paternal Grandfather        skin  . Parkinson's disease Paternal Grandfather   . COPD Neg Hx   . Colon cancer Neg Hx     Review of Systems  Constitutional: Negative for chills and fever.       Objective:   Vitals:   01/15/20 1318  BP: 118/78  Pulse: 68  Temp: 98.1 F (36.7 C)  SpO2: 97%   BP Readings from Last 3 Encounters:  01/15/20 118/78  12/09/19 128/78  08/05/19 118/80   Wt Readings from Last 3 Encounters:  01/15/20 177 lb 9.6 oz (80.6 kg)  12/09/19 178 lb (80.7 kg)  08/05/19 178 lb (80.7 kg)   Body mass index is 28.24 kg/m.   Physical Exam Constitutional:      General: She is not in acute distress.    Appearance: Normal appearance. She is not ill-appearing.  Skin:    General: Skin is warm and dry.     Findings: Erythema (Patch of erythema right lateral breast proximately the size of a small orange that is warm to touch and minimally tender.  No areas of fluctuance.  There are 3 tender rough areas that look like possible insect bites.  No open wounds or drainage.) present.  Neurological:     Mental Status: She is alert.            Assessment & Plan:    See Problem List for Assessment and Plan of chronic medical problems.    This visit occurred during the SARS-CoV-2 public health emergency.  Safety protocols were in place, including screening questions prior to the visit, additional usage of staff PPE, and extensive cleaning  of exam room while observing appropriate contact time as indicated for disinfecting solutions.

## 2020-01-15 ENCOUNTER — Encounter: Payer: Self-pay | Admitting: Internal Medicine

## 2020-01-15 ENCOUNTER — Ambulatory Visit: Payer: BC Managed Care – PPO | Admitting: Internal Medicine

## 2020-01-15 DIAGNOSIS — L03313 Cellulitis of chest wall: Secondary | ICD-10-CM

## 2020-01-15 DIAGNOSIS — L0390 Cellulitis, unspecified: Secondary | ICD-10-CM | POA: Insufficient documentation

## 2020-01-15 MED ORDER — LEVOFLOXACIN 500 MG PO TABS: 500.0000 mg | ORAL_TABLET | Freq: Every day | ORAL | 0 refills | Status: DC

## 2020-01-15 NOTE — Assessment & Plan Note (Signed)
Acute Right lateral breast cellulitis-likely related to insect bites Area has been expanding and erythema and is warm and slightly tender.  It also itches Start Levaquin 500 mg daily due to her allergies Can apply topical antiitch medication if needed She will monitor closely and call with any questions or concerns

## 2020-01-15 NOTE — Patient Instructions (Signed)
Take the Levaquin daily for 7 days.    Monitor your infection closely and call with any questions.

## 2020-01-18 DIAGNOSIS — K200 Eosinophilic esophagitis: Secondary | ICD-10-CM | POA: Diagnosis not present

## 2020-02-05 ENCOUNTER — Other Ambulatory Visit: Payer: Self-pay | Admitting: Internal Medicine

## 2020-02-10 ENCOUNTER — Telehealth: Payer: Self-pay | Admitting: Internal Medicine

## 2020-02-10 MED ORDER — PINDOLOL 5 MG PO TABS: 5.0000 mg | ORAL_TABLET | Freq: Two times a day (BID) | ORAL | 0 refills | Status: DC

## 2020-02-10 MED ORDER — PYRIDOSTIGMINE BROMIDE 60 MG PO TABS: 60.0000 mg | ORAL_TABLET | Freq: Two times a day (BID) | ORAL | 0 refills | Status: DC

## 2020-02-10 NOTE — Telephone Encounter (Signed)
*  STAT* If patient is at the pharmacy, call can be transferred to refill team.   1. Which medications need to be refilled? (please list name of each medication and dose if known) pyridostigmine and pindolol  2. Which pharmacy/location (including street and city if local pharmacy) is medication to be sent to? [facility]   3. Do they need a 30 day or 90 day supply? Patient need 90 day due to insurance.

## 2020-02-10 NOTE — Telephone Encounter (Signed)
Pt's medications were sent to pt's pharmacy as requested. Confirmation received.  

## 2020-02-10 NOTE — Progress Notes (Signed)
Cardiology Office Note   Date:  02/11/2020   ID:  Chelsey Sanford, DOB 31-Jan-1976, MRN [id number]  PCP:  Binnie Rail, MD  Cardiologist:   Dorris Carnes, MD   F/U of autonomic dysfunciton      History of Present Illness: Chelsey Sanford is a 44 y.o. female with a history of autonomic dysfunction  I saw her in cliic in 2018    Since then she was seen by [name] in 2020  Since seen she has had thyroid surgery for Hashimoto's   Took a long time to recover  Still doesn't have eneryg like prior    Having chronic sinus problems Neck problems   Plan for surgery     Walking dogs daily     Does get dizzy occasionally    May be from quick changes in position  Ran out of pindolol 1 wks ago     Current Meds  Medication Sig  . cetirizine (ZYRTEC) 10 MG tablet Take 10 mg by mouth daily.  . Cyanocobalamin 500 MCG/0.1ML SOLN 1 spray in one nostril qwk (Patient taking differently: Place 500 mcg into alternate nostrils once a week. 1 spray in one nostril qwk)  . diphenhydrAMINE (BENADRYL) 25 MG tablet Take 50 mg by mouth every 6 (six) hours as needed for itching or allergies.  Marland Kitchen docusate sodium (COLACE) 100 MG capsule Take 100 mg by mouth daily as needed for mild constipation.  . Lactobacillus-Inulin (CULTURELLE DIGESTIVE HEALTH PO) Take 1 tablet by mouth daily.   . pantoprazole (PROTONIX) 40 MG tablet TAKE 1 TABLET BY MOUTH DAILY  . pindolol (VISKEN) 5 MG tablet Take 1 tablet (5 mg total) by mouth 2 (two) times daily.  Marland Kitchen pyridostigmine (MESTINON) 60 MG tablet Take 1 tablet (60 mg total) by mouth 2 (two) times daily.  . rizatriptan (MAXALT-MLT) 10 MG disintegrating tablet TAKE 1 TABLET BY MOUTH AS NEEDED FOR MIGRAINE, MAY REPEAT IN 2 HOURS IF NEEDED  . [DISCONTINUED] magnesium oxide (MAG-OX) 400 MG tablet Take 1 tablet (400 mg total) by mouth daily.  . [DISCONTINUED] pindolol (VISKEN) 5 MG tablet Take 1 tablet (5 mg total) by mouth 2 (two) times daily. Please keep upcoming appt in October before  anymore refills. Thank you  . [DISCONTINUED] pyridostigmine (MESTINON) 60 MG tablet Take 1 tablet (60 mg total) by mouth 2 (two) times daily. Please keep upcoming appt in October for future refills. Thank you     Allergies:   Adhesive [tape], Sulfonamide derivatives, Doxycycline, Sulfa antibiotics, Amoxicillin, Erythromycin, Latex, and Penicillins   Past Medical History:  Diagnosis Date  . ALLERGIC RHINITIS   . ANEMIA-NOS   . Anxiety   . Complication of anesthesia   . Delayed gastric emptying    dx at [facility] by gastric emptying study per patient  . DYSAUTONOMIA   . ENDOMETRIOSIS   . GERD (gastroesophageal reflux disease) 10/06/2011  . Headache(784.0)   . Irritable bowel syndrome   . Lumbar disc disease   . Migraine    since 6th grade  . PONV (postoperative nausea and vomiting)    scope patch works well  . POTS (postural orthostatic tachycardia syndrome)    not triggered by anesthesia. usually triggered by dehydration    Past Surgical History:  Procedure Laterality Date  . ANKLE RECONSTRUCTION  1993  . ELBOW SURGERY Right 2015  . SINUS EXPLORATION  2015  . THYROID LOBECTOMY Right 06/08/2019   Procedure: RIGHT THYROID LOBECTOMY;  Surgeon: Armandina Gemma, MD;  Location: [facility];  Service: General;  Laterality: Right;  . TONSILLECTOMY  1996  . WISDOM TOOTH EXTRACTION       Social History:  The patient  reports that she quit smoking about 23 years ago. She has never used smokeless tobacco. She reports current alcohol use. She reports that she does not use drugs.   Family History:  The patient's family history includes Arthritis/Rheumatoid in her paternal grandmother; Cancer in her paternal grandfather; Colon polyps in her mother; Crohn's disease in her maternal grandmother; Migraines in her mother; Parkinson's disease in her paternal grandfather and paternal grandmother; Skin cancer in her mother; Thyroid disease in her mother.    ROS:  Please see the history of  present illness. All other systems are reviewed and  Negative to the above problem except as noted.    PHYSICAL EXAM: VS:  BP 135/89   Pulse 80   Ht 5' 6.5" (1.689 m)   Wt 177 lb 9.6 oz (80.6 kg)   BMI 28.24 kg/m    Orthostatic:   Laying   131/89  P 76;  Sitting:  138/97   P 76   Standing:  150/91  P 82  Standing 4 min 134/94  P 84 GEN: Well nourished, well developed, in no acute distress  HEENT: normal  Neck: no JVD Cardiac: RRR; no murmurs  No LE edema  Respiratory:  clear to auscultation bilaterally, normal work of breathing GI: soft, nontender, nondistended, + BS  No hepatomegaly  MS: no deformity Moving all extremities   Skin: warm and dry, no rash Neuro:  Strength and sensation are intact Psych: euthymic mood, full affect   EKG:  EKG is ordered today.  SR 77 bpm    Lipid Panel    Component Value Date/Time   CHOL 202 (H) 02/11/2020 0936   TRIG 90 02/11/2020 0936   HDL 58 02/11/2020 0936   CHOLHDL 3.5 02/11/2020 0936   CHOLHDL 4 05/27/2018 1029   VLDL 24.2 05/27/2018 1029   LDLCALC 128 (H) 02/11/2020 0936      Wt Readings from Last 3 Encounters:  02/11/20 177 lb 9.6 oz (80.6 kg)  01/15/20 177 lb 9.6 oz (80.6 kg)  12/09/19 178 lb (80.7 kg)      ASSESSMENT AND PLAN:  1  Autonomic dysfunction   PTwith occasional dizziness   Also with some fatigue  Fatigue may be from recovering from thyroid surgery On exam today no orthostatic changes   BP is actually high  ( she has been out of Pindolol)    Would refill  Encouraged her to continue to stay hydrated and stay active     2  Hx thyroid dysfunciton   S/P surgery   Follows with [name]   Will set for TSH, free T3, free T4 today given fatigue      3  HL   Mild increase LDL  Will recheck today    Plan for f/u in 1 year   Resume B Blocker  Call if BP remains high     Current medicines are reviewed at length with the patient today.  The patient does not have concerns regarding medicine  Signed, Dorris Carnes, MD    02/11/2020 10:17 PM    [facility] [address], [place], [place]  [address] Phone: [phone]; Fax: [phone]

## 2020-02-11 ENCOUNTER — Encounter: Payer: Self-pay | Admitting: Internal Medicine

## 2020-02-11 ENCOUNTER — Ambulatory Visit: Payer: BC Managed Care – PPO | Admitting: Internal Medicine

## 2020-02-11 ENCOUNTER — Other Ambulatory Visit: Payer: Self-pay

## 2020-02-11 VITALS — BP 135/89 | HR 80 | Ht 66.5 in | Wt 177.6 lb

## 2020-02-11 DIAGNOSIS — E0590 Thyrotoxicosis, unspecified without thyrotoxic crisis or storm: Secondary | ICD-10-CM

## 2020-02-11 DIAGNOSIS — G90A Postural orthostatic tachycardia syndrome (POTS): Secondary | ICD-10-CM

## 2020-02-11 DIAGNOSIS — I498 Other specified cardiac arrhythmias: Secondary | ICD-10-CM

## 2020-02-11 LAB — BASIC METABOLIC PANEL
BUN/Creatinine Ratio: 16 (ref 9–23)
BUN: 11 mg/dL (ref 6–24)
CO2: 24 mmol/L (ref 20–29)
Calcium: 9.4 mg/dL (ref 8.7–10.2)
Chloride: 100 mmol/L (ref 96–106)
Creatinine, Ser: 0.7 mg/dL (ref 0.57–1.00)
GFR calc Af Amer: 121 mL/min/{1.73_m2} (ref >59)
GFR calc non Af Amer: 105 mL/min/{1.73_m2} (ref >59)
Glucose: 94 mg/dL (ref 65–99)
Potassium: 4.4 mmol/L (ref 3.5–5.2)
Sodium: 135 mmol/L (ref 134–144)

## 2020-02-11 LAB — CBC
Hematocrit: 40.1 % (ref 34.0–46.6)
Hemoglobin: 13.6 g/dL (ref 11.1–15.9)
MCH: 28.5 pg (ref 26.6–33.0)
MCHC: 33.9 g/dL (ref 31.5–35.7)
MCV: 84 fL (ref 79–97)
Platelets: 237 10*3/uL (ref 150–450)
RBC: 4.77 x10E6/uL (ref 3.77–5.28)
RDW: 12 % (ref 11.7–15.4)
WBC: 8.2 10*3/uL (ref 3.4–10.8)

## 2020-02-11 LAB — LIPID PANEL
Chol/HDL Ratio: 3.5 ratio (ref 0.0–4.4)
Cholesterol, Total: 202 mg/dL — ABNORMAL HIGH (ref 100–199)
HDL: 58 mg/dL (ref >39)
LDL Chol Calc (NIH): 128 mg/dL — ABNORMAL HIGH (ref 0–99)
Triglycerides: 90 mg/dL (ref 0–149)
VLDL Cholesterol Cal: 16 mg/dL (ref 5–40)

## 2020-02-11 LAB — T3, FREE: T3, Free: 3.3 pg/mL (ref 2.0–4.4)

## 2020-02-11 LAB — T4, FREE: Free T4: 1.18 ng/dL (ref 0.82–1.77)

## 2020-02-11 LAB — TSH: TSH: 3.94 u[IU]/mL (ref 0.450–4.500)

## 2020-02-11 MED ORDER — PYRIDOSTIGMINE BROMIDE 60 MG PO TABS
60.0000 mg | ORAL_TABLET | Freq: Two times a day (BID) | ORAL | 0 refills | Status: DC
Start: 2020-02-11 — End: 2021-02-16

## 2020-02-11 MED ORDER — PINDOLOL 5 MG PO TABS: 5.0000 mg | ORAL_TABLET | Freq: Two times a day (BID) | ORAL | 3 refills | Status: DC

## 2020-02-11 NOTE — Patient Instructions (Signed)
Medication Instructions:  No changes today *If you need a refill on your cardiac medications before your next appointment, please call your pharmacy*   Lab Work: Today: cbc, bmet, lipids, tsh, free T3, free T4  If you have labs (blood work) drawn today and your tests are completely normal, you will receive your results only by: Marland Kitchen MyChart Message (if you have MyChart) OR . A paper copy in the mail If you have any lab test that is abnormal or we need to change your treatment, we will call you to review the results.   Testing/Procedures: none   Follow-Up: At [facility], you and your health needs are our priority.  As part of our continuing mission to provide you with exceptional heart care, we have created designated Provider Care Teams.  These Care Teams include your primary Cardiologist (physician) and Advanced Practice Providers (APPs -  Physician Assistants and Nurse Practitioners) who all work together to provide you with the care you need, when you need it.    Your next appointment:   12 month(s)  The format for your next appointment:   In Person  Provider:   You may see Dorris Carnes, MD or one of the following Advanced Practice Providers on your designated Care Team:    Richardson Dopp, PA-C  Robbie Lis, [place]    Other Instructions

## 2020-02-12 DIAGNOSIS — D225 Melanocytic nevi of trunk: Secondary | ICD-10-CM | POA: Diagnosis not present

## 2020-02-12 DIAGNOSIS — L578 Other skin changes due to chronic exposure to nonionizing radiation: Secondary | ICD-10-CM | POA: Diagnosis not present

## 2020-02-12 DIAGNOSIS — L239 Allergic contact dermatitis, unspecified cause: Secondary | ICD-10-CM | POA: Diagnosis not present

## 2020-02-12 DIAGNOSIS — L821 Other seborrheic keratosis: Secondary | ICD-10-CM | POA: Diagnosis not present

## 2020-02-17 ENCOUNTER — Telehealth: Payer: Self-pay | Admitting: *Deleted

## 2020-02-17 NOTE — Telephone Encounter (Signed)
° °  [facility] Pre-operative Risk Assessment    HEARTCARE STAFF: - Please ensure there is not already an duplicate clearance open for this procedure. - Under Visit Info/Reason for Call, type in Other and utilize the format Clearance MM/DD/YY or Clearance TBD. Do not use dashes or single digits. - If request is for dental extraction, please clarify the # of teeth to be extracted.  Request for surgical clearance:  1. What type of surgery is being performed? C6-C7 ANTERIOR CERVICAL FUSION   2. When is this surgery scheduled? 03/18/20   3. What type of clearance is required (medical clearance vs. Pharmacy clearance to hold med vs. Both)? MEDICAL  4. Are there any medications that need to be held prior to surgery and how long? NONE LISTED   5. Practice name and name of physician performing surgery? [facility]; DR. Sherley Bounds   6. What is the office phone number? [phone]   7.   What is the office fax number? [place]: VANESSA  8.   Anesthesia type (None, local, MAC, general) ? GENERAL   Julaine Hua 02/17/2020, 2:55 PM  _________________________________________________________________   (provider comments below)

## 2020-02-18 NOTE — Telephone Encounter (Signed)
   Primary Cardiologist: Dorris Carnes, MD  Chart reviewed as part of pre-operative protocol coverage. Patient was contacted 02/18/2020 in reference to pre-operative risk assessment for pending surgery as outlined below.  Chelsey Sanford was last seen on 02/11/20 by Dr. Harrington Challenger.  Since that day, Chelsey Sanford has done well.  She does not have a history of MI or CVA. She can complete more than 4.0 METS without angina.   Therefore, based on ACC/AHA guidelines, the patient would be at acceptable risk for the planned procedure without further cardiovascular testing.   The patient was advised that if she develops new symptoms prior to surgery to contact our office to arrange for a follow-up visit, and she verbalized understanding.  I will route this recommendation to the requesting party via Epic fax function and remove from pre-op pool. Please call with questions.  Tami Lin Nazeer Romney, PA 02/18/2020, 8:25 AM

## 2020-02-29 ENCOUNTER — Other Ambulatory Visit: Payer: Self-pay | Admitting: Internal Medicine

## 2020-02-29 ENCOUNTER — Other Ambulatory Visit: Payer: Self-pay

## 2020-02-29 ENCOUNTER — Ambulatory Visit
Admission: RE | Admit: 2020-02-29 | Discharge: 2020-02-29 | Disposition: A | Discharge status: Home / Self Care | Payer: BC Managed Care – PPO | Source: Ambulatory Visit

## 2020-02-29 DIAGNOSIS — Z1231 Encounter for screening mammogram for malignant neoplasm of breast: Secondary | ICD-10-CM

## 2020-03-02 ENCOUNTER — Other Ambulatory Visit: Payer: Self-pay

## 2020-03-02 ENCOUNTER — Encounter: Payer: Self-pay | Admitting: Internal Medicine

## 2020-03-02 ENCOUNTER — Ambulatory Visit: Payer: BC Managed Care – PPO | Admitting: Internal Medicine

## 2020-03-02 VITALS — BP 122/82 | HR 70 | Ht 66.5 in | Wt 179.0 lb

## 2020-03-02 DIAGNOSIS — E041 Nontoxic single thyroid nodule: Secondary | ICD-10-CM

## 2020-03-02 DIAGNOSIS — E0500 Thyrotoxicosis with diffuse goiter without thyrotoxic crisis or storm: Secondary | ICD-10-CM

## 2020-03-02 NOTE — Patient Instructions (Signed)
Please continue off Methimazole.  Please stop at the lab.  Please come back for a follow-up appointment in 1 year but with labs at 6 months.

## 2020-03-02 NOTE — Progress Notes (Signed)
Patient ID: Chelsey Sanford, female   DOB: 01/19/1976, 44 y.o.   MRN: [id number]   This visit occurred during the SARS-CoV-2 public health emergency.  Safety protocols were in place, including screening questions prior to the visit, additional usage of staff PPE, and extensive cleaning of exam room while observing appropriate contact time as indicated for disinfecting solutions.   HPI  Chelsey Sanford is a 44 y.o.-year-old female, returning for follow-up for subclinical thyrotoxicosis (likely Graves' disease).  Last visit 6 months ago.  She has neck pain and hand weakness >> will have Sx on 03/18/2020.  Reviewed and addended history: Patient was found to have a low TSH on her annual physical exam in 11/2016. She was getting annual TFTs until then due to a family history of hypothyroidism in her mother.  The rest of her TSH levels have been normal since 2008.  Her thyroid tests initially improved in 02/2017, but the latest set of tests worsened again (subclinical thyrotoxicosis).  We started 5 mg of methimazole daily in 05/2017.  She started to feel better afterwards but she was lost for follow-up for 1 year and 7 months after our last visit 6 months ago.  Since last OV, she had R thyroidectomy in 06/2019 for a thyroid nodule with inconclusive Bx. Final pathology was benign.  She was able to stop methimazole after her thyroid surgery.  Reviewed her TFTs: Lab Results  Component Value Date   TSH 3.940 02/11/2020   TSH 2.77 09/17/2019   TSH 3.16 08/05/2019   TSH 2.17 05/06/2019   TSH 2.58 02/27/2019   TSH 3.02 12/24/2017   TSH 0.18 (L) 07/24/2017   TSH 0.01 (L) 06/19/2017   TSH 0.28 (L) 03/07/2017   TSH 0.01 (L) 12/14/2016   FREET4 1.18 02/11/2020   FREET4 0.78 09/17/2019   FREET4 0.68 08/05/2019   FREET4 1.13 05/06/2019   FREET4 0.87 02/27/2019   FREET4 0.72 12/24/2017   FREET4 0.86 07/24/2017   FREET4 1.20 06/19/2017   FREET4 0.82 03/07/2017   FREET4 0.86 12/26/2016   T3FREE 3.3  02/11/2020   T3FREE 3.1 09/17/2019   T3FREE 3.1 08/05/2019   T3FREE 3.5 05/06/2019   T3FREE 3.3 02/27/2019   T3FREE 3.5 12/24/2017   T3FREE 3.2 07/24/2017   T3FREE 5.2 (H) 06/19/2017   T3FREE 3.5 03/07/2017   T3FREE 3.8 12/26/2016   Her TPO antibodies were elevated: Component     Latest Ref Rng & Units 12/26/2016  Thyroperoxidase Ab SerPl-aCnc     <9 IU/mL 129 (H)  Thyroglobulin Ab     <2 IU/mL 1   Her Graves' antibodies are elevated: Lab Results  Component Value Date   TSI 441 (H) 03/07/2017   Reviewed the rest of her studies/procedures: We obtained a thyroid uptake and scan (04/03/2017): Uniform scan with the exception of a small right cold nodule, uptake at the upper limit of normal, 29%.  This was consistent with possible mild Graves' disease.  We then checked a thyroid ultrasound (04/15/2017): Small, 1.2 cm isoechoic solid nodule in the right lobe.  Also, small, subcentimeter echogenic left nodules.  None of the nodules meet criteria for biopsy or follow-up.  We repeated a thyroid ultrasound (04/01/2019): Her dominant right thyroid nodule has increased (1.5 cm X 1.5 x 0.7 cm) in size and risk.  Biopsy was recommended.  FNA (04/09/2019): Inconclusive: Atypia of undetermined significance (Bethesda category III); Afirma molecular marker: Suspicious (50% cancer risk)  I referred her to surgery and she had Right thyroid lobectomy (06/08/2019):  Benign nodule, lymphocytic thyroiditis.  Pt denies: - hoarseness - dysphagia - choking - SOB with lying down She saw ENT - still has globus sensation.  Pt does have a FH of thyroid ds. In Mother >> hypothyroidism. No FH of thyroid cancer. No h/o radiation tx to head or neck.  No herbal supplements. No Biotin use. No recent steroids use.   Pt. also has a history of POTS dx 2008 - managed by cardiology.  Was on Florinef and Mestinon >> now off Florinef. On Pindolol and Mestinon. She also has B12 and vitamin D deficiency. She has  longstanding anemia. Also, eosinophilic esophagitis and hiatal hernia >> on Protonix.  She exercises every day: walking, yoga, Peloton.  ROS: Constitutional: weight gain/no weight loss, + fatigue, no subjective hyperthermia, no subjective hypothermia Eyes: no blurry vision, no xerophthalmia ENT: no sore throat, no nodules + see HPI Cardiovascular: no CP/no SOB/no palpitations/no leg swelling Respiratory: no cough/no SOB/no wheezing Gastrointestinal: no N/no V/no D/no C/no acid reflux Musculoskeletal: + muscle aches/+ joint aches Skin: no rashes, no hair loss Neurological: no tremors/no numbness/no tingling/no dizziness  I reviewed pt's medications, allergies, PMH, social hx, family hx, and changes were documented in the history of present illness. Otherwise, unchanged from my initial visit note.  Past Medical History:  Diagnosis Date  . ALLERGIC RHINITIS   . ANEMIA-NOS   . Anxiety   . Complication of anesthesia   . Delayed gastric emptying    dx at [facility] by gastric emptying study per patient  . DYSAUTONOMIA   . ENDOMETRIOSIS   . GERD (gastroesophageal reflux disease) 10/06/2011  . Headache(784.0)   . Irritable bowel syndrome   . Lumbar disc disease   . Migraine    since 6th grade  . PONV (postoperative nausea and vomiting)    scope patch works well  . POTS (postural orthostatic tachycardia syndrome)    not triggered by anesthesia. usually triggered by dehydration   Past Surgical History:  Procedure Laterality Date  . ANKLE RECONSTRUCTION  1993  . BREAST BIOPSY Right 01/12/2015   fibroadenoma  . ELBOW SURGERY Right 2015  . SINUS EXPLORATION  2015  . THYROID LOBECTOMY Right 06/08/2019   Procedure: RIGHT THYROID LOBECTOMY;  Surgeon: Armandina Gemma, MD;  Location: [facility];  Service: General;  Laterality: Right;  . TONSILLECTOMY  1996  . WISDOM TOOTH EXTRACTION     Social History   Socioeconomic History  . Marital status: Married    Spouse name: [name]  .  Number of children: 1  . Years of education: 61  . Highest education level: Not on file  Occupational History  . Occupation: Retail banker: Self Employed    Comment: [employer]  Tobacco Use  . Smoking status: Former Smoker    Last attempt to quit: 04/30/1996    Years since quitting: 20.8  . Smokeless tobacco: Never Used  Substance and Sexual Activity  . Alcohol use: Yes    Comment: 1 time weekly - 2 drinks of winr  . Drug use: No  . Sexual activity: Yes    Birth control/protection: Pill  Other Topics Concern  . 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 cetirizine (ZYRTEC) 10 MG tablet Take 10 mg by mouth daily.    . Cyanocobalamin 500 MCG/0.1ML SOLN 1 spray in one nostril qwk (Patient taking differently: Place 500 mcg into alternate nostrils once a week. 1 spray in one nostril qwk) 3 Bottle 3  . diphenhydrAMINE (BENADRYL) 25 MG tablet Take 50 mg by mouth every 6 (six) hours as needed for itching or allergies.    Marland Kitchen docusate sodium (COLACE) 100 MG capsule Take 100 mg by mouth daily as needed for mild constipation.    . Lactobacillus-Inulin (CULTURELLE DIGESTIVE HEALTH PO) Take 1 tablet by mouth daily.     . pantoprazole (PROTONIX) 40 MG tablet TAKE 1 TABLET BY MOUTH DAILY 90 tablet 0  . pindolol (VISKEN) 5 MG tablet Take 1 tablet (5 mg total) by mouth 2 (two) times daily. 180 tablet 3  . pyridostigmine (MESTINON) 60 MG tablet Take 1 tablet (60 mg total) by mouth 2 (two) times daily. 180 tablet 0  . rizatriptan (MAXALT-MLT) 10 MG disintegrating tablet TAKE 1 TABLET BY MOUTH AS NEEDED FOR MIGRAINE, MAY REPEAT IN 2 HOURS IF NEEDED 9 tablet 2  . [DISCONTINUED]  magnesium oxide (MAG-OX) 400 MG tablet Take 1 tablet (400 mg total) by mouth daily. 30 tablet 5   No current facility-administered medications on file prior to visit.   Allergies  Allergen Reactions  . Adhesive [Tape] Rash and Other (See Comments)    Skin blistered and disintegrated after epidural (looked like 3rd degree burn) - possibly due to tape and/or tegaderm - do not use anything with adhesive  . Sulfonamide Derivatives Nausea And Vomiting  . Doxycycline Nausea And Vomiting  . Sulfa Antibiotics Nausea And Vomiting  . Amoxicillin Nausea And Vomiting and Rash  . Erythromycin Nausea And Vomiting and Rash  . Latex Rash    See notes under adhesive tape  . Penicillins Nausea And Vomiting and Rash    Has patient had a PCN reaction causing immediate rash, facial/tongue/throat swelling, SOB or lightheadedness with hypotension: Yes Has patient had a PCN reaction causing severe rash involving mucus membranes or skin necrosis: No Has patient had a PCN reaction that required hospitalization: No Has patient had a PCN reaction occurring within the last 10 years: No If all of the above answers are "NO", then may proceed with Cephalosporin use.   Family History  Problem Relation Age of Onset  . Thyroid disease Mother        hypothyroidism  . Migraines Mother   . Colon polyps Mother   . Skin cancer Mother        malignant melanoma  . Crohn's disease Maternal Grandmother   . Parkinson's disease Paternal Grandmother   . Arthritis/Rheumatoid Paternal Grandmother   . Cancer Paternal Grandfather        skin  . Parkinson's disease Paternal Grandfather   . Breast cancer Cousin   . COPD Neg Hx   . Colon cancer Neg Hx     PE: BP 122/82   Pulse 70   Ht 5' 6.5" (1.689 m)   Wt 179 lb (81.2 kg)   SpO2 96%   BMI 28.46 kg/m  Wt Readings from Last 3 Encounters:  03/02/20 179 lb (81.2 kg)  02/11/20 177 lb 9.6 oz (80.6 kg)  01/15/20 177 lb 9.6 oz (80.6 kg)   Constitutional: overweight, in  NAD Eyes: PERRLA, EOMI, no exophthalmos ENT: moist mucous membranes, no neck masses palpated,  thyroidectomy scar healed, no cervical lymphadenopathy Cardiovascular: RRR, No MRG Respiratory: CTA B Gastrointestinal: abdomen soft, NT, ND, BS+ Musculoskeletal: no deformities, strength intact in all 4 Skin: moist, warm, no rashes Neurological: no tremor with outstretched hands, DTR normal in all 4  ASSESSMENT: 1.  Graves' disease - Thyroid uptake and scan (04/03/2017): Upper normal 24 hour radio iodine uptake of 29%. Tiny cold nodule at inferior pole of RIGHT thyroid lobe without dominant thyroid mass.  2.  Small thyroid nodule - Thyroid ultrasound (04/15/2017): Parenchymal Echotexture: Moderately heterogenous Isthmus: 0.3 cm Right lobe: 4.2 x 1.4 x 1.7 cm Left lobe: 4.2 x 1.4 x 1.6 cm _________________________________________________________ Nodule # 1: Location: Right; Inferior Maximum size: 1.2 cm; Other 2 dimensions: 0.8 x 0.8 cm Composition: solid/almost completely solid (2) Echogenicity: isoechoic (1) Given size (<1.4 cm) and appearance, this nodule does NOT meet TI-RADS criteria for biopsy or dedicated follow-up. __________________________________________  An additional small, subcentimeter echogenic nodules present in the left mid gland. This nodule does not meet criteria for further evaluation.  IMPRESSION: Bilateral thyroid nodules which do not meet criteria for further evaluation. No further follow-up is required.  Thyroid U/S (04/01/2019): Narrative & Impression    CLINICAL DATA:  Other.  Dysphagia.  Follow-up thyroid nodules.  COMPARISON:  04/15/2017  FINDINGS: Parenchymal Echotexture: Mildly heterogenous Isthmus: Normal in size measures 0.5 cm in diameter, unchanged Right lobe: Normal in size measuring 4.3 x 1.3 x 1.8 cm, unchanged, previously, 4.2 x 1.4 x 1.7 cm Left lobe: Normal in size measuring 4.4 x 1.6 x 1.7 cm, unchanged, previously, 4.2 x 1.4 x 1.6  cm ___________________________________  Nodule # 1: Prior biopsy: No Location: Right; Inferior Maximum size: 1.5 cm; Other 2 dimensions: 1.5 x 0.7 cm, previously, 1.3 x 1.1 x 0.7 cm Composition: solid/almost completely solid (2) Echogenicity: hypoechoic (2) Significant change in size (>/= 20% in two dimensions and minimal increase of 2 mm): Yes Change in features: Yes nodule now appears hypoechoic, previously, isoechoic Change in ACR TI-RADS risk category: Yes previously a TR3 nodule, currently a TR4 nodule.  **Given size (>/= 1.5 cm) and appearance, fine needle aspiration of this moderately suspicious nodule should be considered based on TI-RADS criteria. _________________________________________________________  The approximately 0.8 cm isoechoic ill-defined nodule/pseudonodule within the mid aspect the right lobe of the thyroid labeled 2), is unchanged compared to the 03/2017 examination, previously, 0.7 cm, and again does not meet imaging criteria to recommend percutaneous sampling or continued dedicated follow-up.  IMPRESSION: Nodule #1 within the inferior pole the right lobe of the thyroid has increased in size and now appears hypoechoic and as such has been up graded from a TR3 nodule to a TR4 nodule and now meets imaging criteria to recommend percutaneous sampling as indicated.  Electronically Signed   By: Sandi Mariscal M.D.   On: 04/02/2019 08:48     04/09/2019: FNA: Atypia of undetermined significance (Bethesda category III); Afirma: Suspicious (50% cancer risk)  06/08/2019: Right thyroid lobectomy: Adenomatous nodule, lymphocytic thyroiditis (benign)  PLAN:  1. Patient with history of subclinical thyrotoxicosis with elevated TSI antibodies, consistent with mild Graves' disease.  We checked a thyroid uptake and scan and the scan was uniform with exception of a small cold nodule in the right lobe of the thyroid.  The uptake was at the upper limit of normal.  At  the time of the scan, her TSH is slightly low, but this worsened afterwards.  We started a low-dose methimazole, 5 mg daily.  She tolerated  this well and felt a little better on it.  Dr. Harlow Asa advised her to start this after her right thyroidectomy (see next problem), performed for enlarging, compressing, cold thyroid nodule.  The nodule turned out to be benign. -TSH obtained approximately 2 weeks ago reviewed and this was normal: Lab Results  Component Value Date   TSH 3.940 02/11/2020  -She continues to have fatigue and palpitations (she is on chronic beta-blockers for her POTS) and also again complains of inability to lose weight -As of now, she is off methimazole and we discussed that her mild thyrotoxicosis may have resolved after her hemithyroidectomy -We will check her TFTs in 6 months -We discussed about how to take levothyroxine, in case we need to start  -We will see her back in 1 year  2.  Cold thyroid nodule -Previously with neck compression symptoms: Food getting stuck in her throat -Repeated ultrasound showed an increase in size of the nodule.  This was cold on the uptake and scan. -We biopsied the nodule and the results were inconclusive.  Afirma molecular marker returned suspicious >> high risk of cancer: ~50% >> I referred her to surgery -She is now status post right hemithyroidectomy 06/2019 and the final pathology was benign -No further ultrasounds are needed.  Orders Placed This Encounter  Procedures  . T3, free  . T4, free  . TSH   Philemon Kingdom, MD PhD [facility] Endocrinology

## 2020-03-18 DIAGNOSIS — M5412 Radiculopathy, cervical region: Secondary | ICD-10-CM | POA: Diagnosis not present

## 2020-03-18 DIAGNOSIS — M4722 Other spondylosis with radiculopathy, cervical region: Secondary | ICD-10-CM | POA: Diagnosis not present

## 2020-03-18 DIAGNOSIS — M4802 Spinal stenosis, cervical region: Secondary | ICD-10-CM | POA: Diagnosis not present

## 2020-03-18 DIAGNOSIS — M50123 Cervical disc disorder at C6-C7 level with radiculopathy: Secondary | ICD-10-CM | POA: Diagnosis not present

## 2020-05-12 ENCOUNTER — Other Ambulatory Visit: Payer: Self-pay | Admitting: Internal Medicine

## 2020-08-01 ENCOUNTER — Encounter: Payer: Self-pay | Admitting: Internal Medicine

## 2020-08-01 ENCOUNTER — Other Ambulatory Visit: Payer: Self-pay

## 2020-08-01 ENCOUNTER — Ambulatory Visit: Payer: BC Managed Care – PPO | Admitting: Internal Medicine

## 2020-08-01 VITALS — BP 124/78 | HR 77 | Temp 98.2°F | Ht 66.5 in | Wt 178.0 lb

## 2020-08-01 DIAGNOSIS — M7662 Achilles tendinitis, left leg: Secondary | ICD-10-CM | POA: Diagnosis not present

## 2020-08-01 MED ORDER — MELOXICAM 7.5 MG PO TABS: 7.5000 mg | ORAL_TABLET | Freq: Every day | ORAL | 0 refills | Status: DC

## 2020-08-01 NOTE — Assessment & Plan Note (Signed)
Acute Symptoms and exam c/w left achilles tendinitis Start meloxciam 7.5 mg daily - take with food Ice area Revise activities - avoid anything that causes pain Will refer to PT If no improvement will refer to sports med

## 2020-08-01 NOTE — Patient Instructions (Addendum)
Take meloxicam 7.5 mg daily with food.     A referral was ordered for PT.    Please call if there is no improvement in your symptoms.    Rosen's Emergency Medicine: Concepts and Clinical Practice (9th ed., pp. 8527-7824). [place], [place]: [place]. Retrieved from https://www.clinicalkey.[email or web address]  Achilles Tendinitis  Achilles tendinitis is inflammation of the tough, cord-like band that attaches the lower leg muscles to the heel bone (Achilles tendon). This is usually caused by overusing the tendon and the ankle joint. Achilles tendinitis usually gets better over time with treatment and caring for yourself at home. It can take weeks or months to heal completely. What are the causes? This condition may be caused by:  A sudden increase in exercise or activity, such as running.  Doing the same exercises or activities, such as jumping, over and over.  Not warming up calf muscles before exercising.  Exercising in shoes that are worn out or not made for exercise.  Having arthritis or a bone growth (spur) on the back of the heel bone. This can rub against the tendon and hurt it.  Age-related wear and tear. Tendons become less flexible with age and are more likely to be injured. What are the signs or symptoms? Common symptoms of this condition include:  Pain in the Achilles tendon or in the back of the leg, just above the heel. The pain usually gets worse with exercise.  Stiffness or soreness in the back of the leg, especially in the morning.  Swelling of the skin over the Achilles tendon.  Thickening of the tendon.  Trouble standing on tiptoe. How is this diagnosed? This condition is diagnosed based on your symptoms and a physical exam. You may have tests, including:  X-rays.  MRI. How is this treated? The goal of treatment is to relieve symptoms and help your injury heal. Treatment may include:  Decreasing  or stopping activities that caused the tendinitis. This may mean switching to low-impact exercises like biking or swimming.  Icing the injured area.  Doing physical therapy, including strengthening and stretching exercises.  Taking NSAIDs, such as ibuprofen, to help relieve pain and swelling.  Using supportive shoes, wraps, heel lifts, or a walking boot (air cast).  Having surgery. This may be done if your symptoms do not improve after other treatments.  Using high-energy shock wave impulses to stimulate the healing process (extracorporeal shock wave therapy). This is rare.  Having an injection of medicines that help relieve inflammation (corticosteroids). This is rare. Follow these instructions at home: If you have an air cast:  Wear the air cast as told by your health care provider. Remove it only as told by your health care provider.  Loosen it if your toes tingle, become numb, or turn cold and blue.  Keep it clean.  If the air cast is not waterproof: ? Do not let it get wet. ? Cover it with a watertight covering when you take a bath or shower. Managing pain, stiffness, and swelling  If directed, put ice on the injured area. To do this: ? If you have a removable air cast, remove it as told by your health care provider. ? Put ice in a plastic bag. ? Place a towel between your skin and the bag. ? Leave the ice on for 20 minutes, 2-3 times a day.  Move your toes often to reduce stiffness and swelling.  Raise (elevate) your foot above the level of your heart while you are sitting or  lying down.   Activity  Gradually return to your normal activities as told by your health care provider. Ask your health care provider what activities are safe for you.  Do not do activities that cause pain.  Consider doing low-impact exercises, like cycling or swimming.  Ask your health care provider when it is safe to drive if you have an air cast on your foot.  If physical therapy was  prescribed, do exercises as told by your health care provider or physical therapist. General instructions  If directed, wrap your foot with an elastic bandage or other wrap. This can help to keep your tendon from moving too much while it heals. Your health care provider will show you how to wrap your foot correctly.  Wear supportive shoes or heel lifts only as told by your health care provider.  Take over-the-counter and prescription medicines only as told by your health care provider.  Keep all follow-up visits as told by your health care provider. This is important. Contact a health care provider if you:  Have symptoms that get worse.  Have pain that does not get better with medicine.  Develop new, unexplained symptoms.  Develop warmth and swelling in your foot.  Have a fever. Get help right away if you:  Have a sudden popping sound or sensation in your Achilles tendon followed by severe pain.  Cannot move your toes or foot.  Cannot put any weight on your foot.  Your foot or toes become numb and look white or blue even after loosening your bandage or air cast. Summary  Achilles tendinitis is inflammation of the tough, cord-like band that attaches the lower leg muscles to the heel bone (Achilles tendon).  This condition is usually caused by overusing the tendon and the ankle joint. It can also be caused by arthritis or normal aging.  The most common symptoms of this condition include pain, swelling, or stiffness in the Achilles tendon or in the back of the leg.  This condition is usually treated by decreasing or stopping activities that caused the tendinitis, icing the injured area, taking NSAIDs, and doing physical therapy. This information is not intended to replace advice given to you by your health care provider. Make sure you discuss any questions you have with your health care provider. Document Revised: 09/01/2018 Document Reviewed: 09/01/2018 Elsevier Patient  Education  [place].

## 2020-08-01 NOTE — Progress Notes (Signed)
Subjective:    Patient ID: Chelsey Sanford, female    DOB: April 25, 1976, 45 y.o.   MRN: [id number]  HPI The patient is here for an acute visit.   She woke up sat morning and her left foot hurt.  It hurt all day Saturday and through today.  Sat and Sunday it only hurt when she walked.  Last night it hurt when she was sitting.  Today it hurt when she was drivng.     She walks the dogs daily - 2.5 miles.  She does yoga every morning.  She rides the bike or gets on the treadmill.     No N/T. No swelling.  No redness/bruising.   Medications and allergies reviewed with patient and updated if appropriate.  Patient Active Problem List   Diagnosis Date Noted  . Cellulitis 01/15/2020  . Plantar wart, left foot 12/09/2019  . 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 Right thyroid nodule 06/07/2019  . Lower back pain 05/07/2019  . Pre-operative clearance 05/04/2018  . Hyperthyroidism 12/09/2017  . Photodermatitis due to sun 07/24/2017  . Cold thyroid nodule 04/05/2017  . Graves' disease 03/07/2017  . Fatigue 12/14/2016  . 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 Photosensitivity 09/04/2016  . Vitamin D deficiency 08/13/2016  . 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 Dysautonomia ([place]) 05/16/2015  . B12 deficiency 05/16/2015  . Superficial phlebitis 04/29/2013  . Contact dermatitis 09/03/2012  . Migraine headache   . GERD (gastroesophageal reflux disease) 10/06/2011  . Vertigo 10/05/2011  . Anxiety state 05/03/2010  . ALLERGIC RHINITIS 05/03/2010  . POTS (postural orthostatic tachycardia syndrome) 12/26/2007  . Irritable bowel syndrome 09/08/2007  . ENDOMETRIOSIS 07/21/2007    Current Outpatient Medications on File Prior to Visit  Medication Sig Dispense Refill  . cetirizine (ZYRTEC) 10 MG tablet Take 10 mg by mouth daily.    . Cyanocobalamin 500 MCG/0.1ML SOLN 1 spray in one nostril qwk (Patient taking differently: Place 500 mcg  into alternate nostrils once a week. 1 spray in one nostril qwk) 3 Bottle 3  . diphenhydrAMINE (BENADRYL) 25 MG tablet Take 50 mg by mouth every 6 (six) hours as needed for itching or allergies.    Marland Kitchen docusate sodium (COLACE) 100 MG capsule Take 100 mg by mouth daily as needed for mild constipation.    . Lactobacillus-Inulin (CULTURELLE DIGESTIVE HEALTH PO) Take 1 tablet by mouth daily.     . pantoprazole (PROTONIX) 40 MG tablet TAKE 1 TABLET BY MOUTH DAILY 90 tablet 0  . pindolol (VISKEN) 5 MG tablet Take 1 tablet (5 mg total) by mouth 2 (two) times daily. 180 tablet 3  . pyridostigmine (MESTINON) 60 MG tablet Take 1 tablet (60 mg total) by mouth 2 (two) times daily. 180 tablet 0  . rizatriptan (MAXALT-MLT) 10 MG disintegrating tablet TAKE 1 TABLET BY MOUTH AS NEEDED FOR MIGRAINE, MAY REPEAT IN 2 HOURS IF NEEDED 9 tablet 2  . [DISCONTINUED] magnesium oxide (MAG-OX) 400 MG tablet Take 1 tablet (400 mg total) by mouth daily. 30 tablet 5   No current facility-administered medications on file prior to visit.    Past Medical History:  Diagnosis Date  . ALLERGIC RHINITIS   . ANEMIA-NOS   . Anxiety   . Complication of anesthesia   . Delayed gastric emptying    dx at [facility] by gastric emptying study per patient  . DYSAUTONOMIA   . ENDOMETRIOSIS   . GERD (gastroesophageal reflux disease) 10/06/2011  .  Headache(784.0)   . Irritable bowel syndrome   . Lumbar disc disease   . Migraine    since 6th grade  . PONV (postoperative nausea and vomiting)    scope patch works well  . POTS (postural orthostatic tachycardia syndrome)    not triggered by anesthesia. usually triggered by dehydration    Past Surgical History:  Procedure Laterality Date  . ANKLE RECONSTRUCTION  1993  . BREAST BIOPSY Right 01/12/2015   fibroadenoma  . ELBOW SURGERY Right 2015  . SINUS EXPLORATION  2015  . THYROID LOBECTOMY Right 06/08/2019   Procedure: RIGHT THYROID LOBECTOMY;  Surgeon: Armandina Gemma, MD;  Location: [place];  Service: General;  Laterality: Right;  . TONSILLECTOMY  1996  . WISDOM TOOTH EXTRACTION      Social History   Socioeconomic History  . Marital status: Married    Spouse name: [name]  . Number of children: 1  . Years of education: 38  . Highest education level: Not on file  Occupational History  . Occupation: Retail banker: Self Employed    Comment: [employer]  Tobacco Use  . Smoking status: Former Smoker    Quit date: 04/30/1996    Years since quitting: 24.2  . Smokeless tobacco: Never Used  Vaping Use  . Vaping Use: Never used  Substance and Sexual Activity  . Alcohol use: Yes    Comment: several times weekly  . Drug use: No  . Sexual activity: Yes    Birth control/protection: Pill  Other Topics Concern  . 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 Thyroid disease Mother        hypothyroidism  . Migraines Mother   . Colon polyps Mother   . Skin cancer Mother        malignant melanoma  . Crohn's disease Maternal Grandmother   . Parkinson's disease Paternal Grandmother   . Arthritis/Rheumatoid Paternal Grandmother   . Cancer Paternal Grandfather        skin  . Parkinson's disease Paternal Grandfather   . Breast cancer Cousin   . COPD Neg Hx   . Colon cancer Neg Hx     Review of Systems Per HPI    Objective:   Vitals:   08/01/20 1053  BP: 124/78  Pulse: 77  Temp: 98.2 F (36.8 C)  SpO2: 97%   BP Readings from Last  3 Encounters:  08/01/20 124/78  03/02/20 122/82  02/11/20 135/89   Wt Readings from Last 3 Encounters:  08/01/20 178 lb (80.7 kg)  03/02/20 179 lb (81.2 kg)  02/11/20 177 lb 9.6 oz (80.6 kg)   Body mass index is 28.3 kg/m.   Physical Exam Constitutional:      General: She is not in acute distress.    Appearance: Normal appearance. She is not ill-appearing.  HENT:     Head: Normocephalic and atraumatic.  Musculoskeletal:  Comments: Left foot w/o swelling or deformity.  Normal ROM of ankle and toes.  Tenderness at base of achilles tendon  Skin:    General: Skin is warm and dry.     Findings: No bruising or erythema.  Neurological:     Mental Status: She is alert.     Sensory: No sensory deficit.     Motor: No weakness.            Assessment & Plan:    See Problem List for Assessment and Plan of chronic medical problems.    This visit occurred during the SARS-CoV-2 public health emergency.  Safety protocols were in place, including screening questions prior to the visit, additional usage of staff PPE, and extensive cleaning of exam room while observing appropriate contact time as indicated for disinfecting solutions.

## 2020-08-03 ENCOUNTER — Encounter: Payer: Self-pay | Admitting: Internal Medicine

## 2020-08-05 DIAGNOSIS — M7662 Achilles tendinitis, left leg: Secondary | ICD-10-CM | POA: Diagnosis not present

## 2020-08-09 DIAGNOSIS — M6281 Muscle weakness (generalized): Secondary | ICD-10-CM | POA: Diagnosis not present

## 2020-08-09 DIAGNOSIS — M25672 Stiffness of left ankle, not elsewhere classified: Secondary | ICD-10-CM | POA: Diagnosis not present

## 2020-08-09 DIAGNOSIS — M25572 Pain in left ankle and joints of left foot: Secondary | ICD-10-CM | POA: Diagnosis not present

## 2020-08-09 DIAGNOSIS — R262 Difficulty in walking, not elsewhere classified: Secondary | ICD-10-CM | POA: Diagnosis not present

## 2020-08-16 DIAGNOSIS — M25672 Stiffness of left ankle, not elsewhere classified: Secondary | ICD-10-CM | POA: Diagnosis not present

## 2020-08-16 DIAGNOSIS — R262 Difficulty in walking, not elsewhere classified: Secondary | ICD-10-CM | POA: Diagnosis not present

## 2020-08-16 DIAGNOSIS — M6281 Muscle weakness (generalized): Secondary | ICD-10-CM | POA: Diagnosis not present

## 2020-08-16 DIAGNOSIS — M25572 Pain in left ankle and joints of left foot: Secondary | ICD-10-CM | POA: Diagnosis not present

## 2020-08-18 ENCOUNTER — Other Ambulatory Visit: Payer: Self-pay | Admitting: Internal Medicine

## 2020-08-18 DIAGNOSIS — M6281 Muscle weakness (generalized): Secondary | ICD-10-CM | POA: Diagnosis not present

## 2020-08-18 DIAGNOSIS — M25672 Stiffness of left ankle, not elsewhere classified: Secondary | ICD-10-CM | POA: Diagnosis not present

## 2020-08-18 DIAGNOSIS — M25572 Pain in left ankle and joints of left foot: Secondary | ICD-10-CM | POA: Diagnosis not present

## 2020-08-18 DIAGNOSIS — R262 Difficulty in walking, not elsewhere classified: Secondary | ICD-10-CM | POA: Diagnosis not present

## 2020-08-22 ENCOUNTER — Encounter: Payer: Self-pay | Admitting: Internal Medicine

## 2020-08-22 ENCOUNTER — Telehealth (INDEPENDENT_AMBULATORY_CARE_PROVIDER_SITE_OTHER): Payer: BC Managed Care – PPO | Admitting: Internal Medicine

## 2020-08-22 DIAGNOSIS — U071 COVID-19: Secondary | ICD-10-CM | POA: Diagnosis not present

## 2020-08-22 NOTE — Assessment & Plan Note (Signed)
Acute Symptoms started 3 days ago and tested positive with home test today Symptoms relatively mild Discussed symptomatic treatment with over-the-counter cold medications Continue increased fluids and rest She will quarantine for 5 days She will call with any concerning symptoms or questions

## 2020-08-22 NOTE — Progress Notes (Signed)
Virtual Visit via Video Note  I connected with Chelsey Sanford on 08/22/20 at  3:40 PM EDT by a video enabled telemedicine application and verified that I am speaking with the correct person using two identifiers.   I discussed the limitations of evaluation and management by telemedicine and the availability of in person appointments. The patient expressed understanding and agreed to proceed.  Present for the visit:  Myself, Dr Billey Gosling, [name].  The patient is currently at home and I am in the office.    No referring provider.    History of Present Illness: She is here for an acute visit for being COVID-positive.  Her symptoms started 3 days ago in the morning.  She tested today with a home test and it was positive.  She states she feels like she has a horrible head cold.  She states ear pain, nasal congestion With clear mucus, postnasal drip, decreased smell and taste, tender glands in her neck, the inside of her ears itch mild sinus pain, sore throat, itchy eyes and an occasional cough.    She has not had any fever, significant fatigue, headaches, body aches, shortness of breath, wheezing or stomach symptoms.   Drinking lots of water.  She did take something last night to dry up some of the mucus.  She has had 3 COVID vaccines   Review of Systems  Constitutional: Negative for chills, fever and malaise/fatigue.  HENT: Positive for congestion (clear drainage), ear pain, sinus pain (mild) and sore throat.        Dec smell, taste,  PND, tender glands in neck, ears itchy inside  Eyes:       Eyes itchy  Respiratory: Positive for cough (occ). Negative for shortness of breath and wheezing.   Gastrointestinal: Negative for diarrhea and nausea.  Musculoskeletal: Negative for myalgias.  Neurological: Negative for dizziness and headaches.      Social History   Socioeconomic History  . Marital status: Married    Spouse name: [name]  . Number of children: 1  . Years of  education: 32  . Highest education level: Not on file  Occupational History  . Occupation: Retail banker: Self Employed    Comment: [employer]  Tobacco Use  . Smoking status: Former Smoker    Quit date: 04/30/1996    Years since quitting: 24.3  . Smokeless tobacco: Never Used  Vaping Use  . Vaping Use: Never used  Substance and Sexual Activity  . Alcohol use: Yes    Comment: several times weekly  . Drug use: No  . Sexual activity: Yes    Birth control/protection: Pill  Other Topics Concern  . 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   Follow Up Instructions:    I  discussed the assessment and treatment plan with the patient. The patient was provided an opportunity to ask questions and all were answered. The patient agreed with the plan and demonstrated an understanding of the instructions.   The patient was advised to call back or seek an in-person evaluation if the symptoms worsen or if the condition fails to improve as anticipated.    Binnie Rail, MD

## 2020-08-31 ENCOUNTER — Other Ambulatory Visit (INDEPENDENT_AMBULATORY_CARE_PROVIDER_SITE_OTHER): Payer: BC Managed Care – PPO

## 2020-08-31 ENCOUNTER — Other Ambulatory Visit: Payer: Self-pay

## 2020-08-31 DIAGNOSIS — E0500 Thyrotoxicosis with diffuse goiter without thyrotoxic crisis or storm: Secondary | ICD-10-CM

## 2020-08-31 LAB — T4, FREE: Free T4: 0.82 ng/dL (ref 0.60–1.60)

## 2020-08-31 LAB — T3, FREE: T3, Free: 3.5 pg/mL (ref 2.3–4.2)

## 2020-08-31 LAB — TSH: TSH: 3.02 u[IU]/mL (ref 0.35–4.50)

## 2020-11-28 ENCOUNTER — Other Ambulatory Visit: Payer: Self-pay | Admitting: Internal Medicine

## 2021-01-03 DIAGNOSIS — L309 Dermatitis, unspecified: Secondary | ICD-10-CM | POA: Diagnosis not present

## 2021-01-23 ENCOUNTER — Encounter: Payer: Self-pay | Admitting: Internal Medicine

## 2021-01-23 DIAGNOSIS — Z1211 Encounter for screening for malignant neoplasm of colon: Secondary | ICD-10-CM

## 2021-01-25 DIAGNOSIS — Z01419 Encounter for gynecological examination (general) (routine) without abnormal findings: Secondary | ICD-10-CM | POA: Diagnosis not present

## 2021-01-25 DIAGNOSIS — Z130 Encounter for screening for diseases of the blood and blood-forming organs and certain disorders involving the immune mechanism: Secondary | ICD-10-CM | POA: Diagnosis not present

## 2021-01-25 DIAGNOSIS — Z23 Encounter for immunization: Secondary | ICD-10-CM | POA: Diagnosis not present

## 2021-01-25 DIAGNOSIS — Z1151 Encounter for screening for human papillomavirus (HPV): Secondary | ICD-10-CM | POA: Diagnosis not present

## 2021-01-25 DIAGNOSIS — Z124 Encounter for screening for malignant neoplasm of cervix: Secondary | ICD-10-CM | POA: Diagnosis not present

## 2021-01-26 ENCOUNTER — Other Ambulatory Visit: Payer: Self-pay | Admitting: Internal Medicine

## 2021-01-26 DIAGNOSIS — Z1231 Encounter for screening mammogram for malignant neoplasm of breast: Secondary | ICD-10-CM

## 2021-01-27 LAB — HM PAP SMEAR

## 2021-01-27 LAB — HM MAMMOGRAPHY

## 2021-02-15 ENCOUNTER — Other Ambulatory Visit: Payer: Self-pay | Admitting: Internal Medicine

## 2021-02-15 DIAGNOSIS — D225 Melanocytic nevi of trunk: Secondary | ICD-10-CM | POA: Diagnosis not present

## 2021-02-15 DIAGNOSIS — L309 Dermatitis, unspecified: Secondary | ICD-10-CM | POA: Diagnosis not present

## 2021-02-15 DIAGNOSIS — L578 Other skin changes due to chronic exposure to nonionizing radiation: Secondary | ICD-10-CM | POA: Diagnosis not present

## 2021-02-16 ENCOUNTER — Other Ambulatory Visit: Payer: Self-pay | Admitting: *Deleted

## 2021-02-16 MED ORDER — PYRIDOSTIGMINE BROMIDE 60 MG PO TABS: 60.0000 mg | ORAL_TABLET | Freq: Two times a day (BID) | ORAL | 0 refills | Status: DC

## 2021-02-21 ENCOUNTER — Telehealth: Payer: Self-pay | Admitting: Internal Medicine

## 2021-02-21 NOTE — Telephone Encounter (Signed)
Good Morning,   This patient would like to switch care from you Dr. Carlean Purl to Dr. Silverio Decamp. Patient stated that she prefers a female provider. Is this switch alright with you?

## 2021-02-21 NOTE — Telephone Encounter (Signed)
OK with change of provider to female physician

## 2021-02-21 NOTE — Telephone Encounter (Signed)
Ok to schedule next available appointment. Thanks  

## 2021-02-22 ENCOUNTER — Encounter: Payer: Self-pay | Admitting: Gastroenterology

## 2021-03-01 ENCOUNTER — Ambulatory Visit
Admission: RE | Admit: 2021-03-01 | Discharge: 2021-03-01 | Disposition: A | Discharge status: Home / Self Care | Payer: BC Managed Care – PPO | Source: Ambulatory Visit | Attending: Internal Medicine | Admitting: Internal Medicine

## 2021-03-01 ENCOUNTER — Other Ambulatory Visit: Payer: Self-pay

## 2021-03-01 DIAGNOSIS — Z1231 Encounter for screening mammogram for malignant neoplasm of breast: Secondary | ICD-10-CM

## 2021-03-02 ENCOUNTER — Ambulatory Visit: Payer: BC Managed Care – PPO | Admitting: Internal Medicine

## 2021-03-02 ENCOUNTER — Encounter: Payer: Self-pay | Admitting: Internal Medicine

## 2021-03-02 VITALS — BP 100/62 | HR 75 | Ht 66.5 in | Wt 179.4 lb

## 2021-03-02 DIAGNOSIS — E0500 Thyrotoxicosis with diffuse goiter without thyrotoxic crisis or storm: Secondary | ICD-10-CM

## 2021-03-02 DIAGNOSIS — E890 Postprocedural hypothyroidism: Secondary | ICD-10-CM | POA: Diagnosis not present

## 2021-03-02 LAB — T3, FREE: T3, Free: 3.3 pg/mL (ref 2.3–4.2)

## 2021-03-02 LAB — T4, FREE: Free T4: 0.73 ng/dL (ref 0.60–1.60)

## 2021-03-02 LAB — TSH: TSH: 3.27 u[IU]/mL (ref 0.35–5.50)

## 2021-03-02 NOTE — Progress Notes (Signed)
Patient ID: Chelsey Sanford, female   DOB: 06-Nov-1975, 45 y.o.   MRN: [id number]   This visit occurred during the SARS-CoV-2 public health emergency.  Safety protocols were in place, including screening questions prior to the visit, additional usage of staff PPE, and extensive cleaning of exam room while observing appropriate contact time as indicated for disinfecting solutions.   HPI  Chelsey Sanford is a 45 y.o.-year-old female, returning for follow-up for subclinical thyrotoxicosis (likely Graves' disease).  Last visit 1 year ago.  Interim history: She still has fatigue, intermittent hair loss, no cold intolerance and constipation. Since last visit, she started to run on her Peloton treadmill about 30 minutes a day, 12-minute mile.  She feels much better!  Reviewed and addended history: Patient was found to have a low TSH on her annual physical exam in 11/2016. She was getting annual TFTs until then due to a family history of hypothyroidism in her mother.  The rest of her TSH levels have been normal since 2008.  Her thyroid tests initially improved in 02/2017, but the latest set of tests worsened again (subclinical thyrotoxicosis).  We started 5 mg of methimazole daily in 05/2017.  She started to feel better afterwards but she was lost for follow-up for 1 year and 7 months afterwards.  She then had R thyroidectomy in 06/2019 for a thyroid nodule with inconclusive Bx. Final pathology was benign.  She was able to stop methimazole after her thyroid surgery.  We did not have to start levothyroxine afterwards.  Reviewed her TFTs: Lab Results  Component Value Date   TSH 3.02 08/31/2020   TSH 3.940 02/11/2020   TSH 2.77 09/17/2019   TSH 3.16 08/05/2019   TSH 2.17 05/06/2019   TSH 2.58 02/27/2019   TSH 3.02 12/24/2017   TSH 0.18 (L) 07/24/2017   TSH 0.01 (L) 06/19/2017   TSH 0.28 (L) 03/07/2017   FREET4 0.82 08/31/2020   FREET4 1.18 02/11/2020   FREET4 0.78 09/17/2019   FREET4 0.68  08/05/2019   FREET4 1.13 05/06/2019   FREET4 0.87 02/27/2019   FREET4 0.72 12/24/2017   FREET4 0.86 07/24/2017   FREET4 1.20 06/19/2017   FREET4 0.82 03/07/2017   T3FREE 3.5 08/31/2020   T3FREE 3.3 02/11/2020   T3FREE 3.1 09/17/2019   T3FREE 3.1 08/05/2019   T3FREE 3.5 05/06/2019   T3FREE 3.3 02/27/2019   T3FREE 3.5 12/24/2017   T3FREE 3.2 07/24/2017   T3FREE 5.2 (H) 06/19/2017   T3FREE 3.5 03/07/2017   Her TPO antibodies were elevated: Component     Latest Ref Rng & Units 12/26/2016  Thyroperoxidase Ab SerPl-aCnc     <9 IU/mL 129 (H)  Thyroglobulin Ab     <2 IU/mL 1   Her Graves' antibodies are elevated: Lab Results  Component Value Date   TSI 441 (H) 03/07/2017   Reviewed the rest of her studies/procedures: We obtained a thyroid uptake and scan (04/03/2017): Uniform scan with the exception of a small right cold nodule, uptake at the upper limit of normal, 29%.  This was consistent with possible mild Graves' disease.  We then checked a thyroid ultrasound (04/15/2017): Small, 1.2 cm isoechoic solid nodule in the right lobe.  Also, small, subcentimeter echogenic left nodules.  None of the nodules meet criteria for biopsy or follow-up.  We repeated a thyroid ultrasound (04/01/2019): Her dominant right thyroid nodule has increased (1.5 cm X 1.5 x 0.7 cm) in size and risk.  Biopsy was recommended.  FNA (04/09/2019): Inconclusive: Atypia  of undetermined significance (Bethesda category III); Afirma molecular marker: Suspicious (50% cancer risk)  I referred her to surgery and she had Right thyroid lobectomy (06/08/2019): Benign nodule, lymphocytic thyroiditis.  Pt denies: - hoarseness - dysphagia - choking - SOB with lying down She saw ENT - still has globus sensation.  Pt does have a FH of thyroid ds. In Mother >> hypothyroidism. No FH of thyroid cancer. No h/o radiation tx to head or neck.  No herbal supplements. No Biotin use. No recent steroids use.   Pt. also has a  history of POTS dx 2008 - managed by cardiology.  Was on Florinef and Mestinon >> now off Florinef. On Pindolol and Mestinon. She also has B12 and vitamin D deficiency. She has longstanding anemia. Also, eosinophilic esophagitis and hiatal hernia >> on Protonix.  ROS: + See HPI  I reviewed pt's medications, allergies, PMH, social hx, family hx, and changes were documented in the history of present illness. Otherwise, unchanged from my initial visit note.  Past Medical History:  Diagnosis Date   ALLERGIC RHINITIS    ANEMIA-NOS    Anxiety    Complication of anesthesia    Delayed gastric emptying    dx at [facility] by gastric emptying study per patient   DYSAUTONOMIA    ENDOMETRIOSIS    GERD (gastroesophageal reflux disease) 10/06/2011   Headache(784.0)    Irritable bowel syndrome    Lumbar disc disease    Migraine    since 6th grade   PONV (postoperative nausea and vomiting)    scope patch works well   POTS (postural orthostatic tachycardia syndrome)    not triggered by anesthesia. usually triggered by dehydration   Past Surgical History:  Procedure Laterality Date   ANKLE RECONSTRUCTION  1993   BREAST BIOPSY Right 01/12/2015   fibroadenoma   ELBOW SURGERY Right 2015   SINUS EXPLORATION  2015   THYROID LOBECTOMY Right 06/08/2019   Procedure: RIGHT THYROID LOBECTOMY;  Surgeon: Armandina Gemma, MD;  Location: [place];  Service: General;  Laterality: Right;   TONSILLECTOMY  1996   WISDOM TOOTH EXTRACTION     Social History   Socioeconomic History   Marital status: Married    Spouse name: [name]   Number of children: 1   Years of education: 16   Highest education level: Not on file  Occupational History   Occupation: Retail banker: Self Employed    Comment: Research scientist (life sciences) and [employer]  Tobacco Use   Smoking status: Former Smoker    Last attempt to quit: 04/30/1996    Years since quitting: 20.8   Smokeless tobacco: Never Used  Substance and  Sexual Activity   Alcohol use: Yes    Comment: 1 time weekly - 2 drinks of winr   Drug use: No   Sexual activity: Yes    Birth control/protection: Pill  Other Topics Concern   Not on file  Social History Narrative   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     Cyanocobalamin 500 MCG/0.1ML SOLN 1 spray in one nostril qwk (Patient taking differently: Place 500 mcg into alternate  nostrils once a week. 1 spray in one nostril qwk) 3 Bottle 3   diphenhydrAMINE (BENADRYL) 25 MG tablet Take 50 mg by mouth every 6 (six) hours as needed for itching or allergies.     docusate sodium (COLACE) 100 MG capsule Take 100 mg by mouth daily as needed for mild constipation.     Lactobacillus-Inulin (CULTURELLE DIGESTIVE HEALTH PO) Take 1 tablet by mouth daily.      meloxicam (MOBIC) 7.5 MG tablet Take 1 tablet (7.5 mg total) by mouth daily. 30 tablet 0   pantoprazole (PROTONIX) 40 MG tablet TAKE 1 TABLET BY MOUTH DAILY 90 tablet 0   pindolol (VISKEN) 5 MG tablet TAKE 1 TABLET(5 MG) BY MOUTH TWICE DAILY 60 tablet 0   pyridostigmine (MESTINON) 60 MG tablet Take 1 tablet (60 mg total) by mouth 2 (two) times daily. 60 tablet 0   rizatriptan (MAXALT-MLT) 10 MG disintegrating tablet TAKE 1 TABLET BY MOUTH AS NEEDED FOR MIGRAINE, MAY REPEAT IN 2 HOURS IF NEEDED 9 tablet 2   [DISCONTINUED] magnesium oxide (MAG-OX) 400 MG tablet Take 1 tablet (400 mg total) by mouth daily. 30 tablet 5   No current facility-administered medications on file prior to visit.   Allergies  Allergen Reactions   Adhesive [Tape] Rash and Other (See Comments)    Skin blistered and disintegrated after epidural (looked like 3rd  degree burn) - possibly due to tape and/or tegaderm - do not use anything with adhesive   Sulfonamide Derivatives Nausea And Vomiting   Doxycycline Nausea And Vomiting   Sulfa Antibiotics Nausea And Vomiting   Amoxicillin Nausea And Vomiting and Rash   Erythromycin Nausea And Vomiting and Rash   Latex Rash    See notes under adhesive tape   Penicillins Nausea And Vomiting and Rash    Has patient had a PCN reaction causing immediate rash, facial/tongue/throat swelling, SOB or lightheadedness with hypotension: Yes Has patient had a PCN reaction causing severe rash involving mucus membranes or skin necrosis: No Has patient had a PCN reaction that required hospitalization: No Has patient had a PCN reaction occurring within the last 10 years: No If all of the above answers are "NO", then may proceed with Cephalosporin use.   Family History  Problem Relation Age of Onset   Thyroid disease Mother        hypothyroidism   Migraines Mother    Colon polyps Mother    Skin cancer Mother        malignant melanoma   Crohn's disease Maternal Grandmother    Parkinson's disease Paternal Grandmother    Arthritis/Rheumatoid Paternal Grandmother    Cancer Paternal Grandfather        skin   Parkinson's disease Paternal Grandfather    Breast cancer Cousin    COPD Neg Hx    Colon cancer Neg Hx     PE: BP 100/62 (BP Location: Right Arm, Patient Position: Sitting, Cuff Size: Normal)   Pulse 75   Ht 5' 6.5" (1.689 m)   Wt 179 lb 6.4 oz (81.4 kg)   SpO2 99%   BMI 28.52 kg/m  Wt Readings from Last 3 Encounters:  03/02/21 179 lb 6.4 oz (81.4 kg)  08/01/20 178 lb (80.7 kg)  03/02/20 179 lb (81.2 kg)   Constitutional: overweight, in NAD Eyes: PERRLA, EOMI, no exophthalmos ENT: moist mucous membranes, no neck masses palpated, thyroidectomy scar healed, no cervical lymphadenopathy Cardiovascular: RRR, No MRG Respiratory: CTA B Gastrointestinal: abdomen soft, NT, ND, BS+ Musculoskeletal: no  deformities, strength intact in all 4 Skin: moist, warm, no rashes Neurological: + Very faint tremor with outstretched hands, DTR normal in all 4  ASSESSMENT: 1.  Graves' disease - Thyroid uptake and scan (04/03/2017): Upper normal 24 hour radio iodine uptake of 29%. Tiny cold nodule at inferior pole of RIGHT thyroid lobe without dominant thyroid mass.  2.  Small thyroid nodule - Thyroid ultrasound (04/15/2017): Parenchymal Echotexture: Moderately heterogenous Isthmus: 0.3 cm Right lobe: 4.2 x 1.4 x 1.7 cm Left lobe: 4.2 x 1.4 x 1.6 cm _________________________________________________________ Nodule # 1: Location: Right; Inferior Maximum size: 1.2 cm; Other 2 dimensions: 0.8 x 0.8 cm Composition: solid/almost completely solid (2) Echogenicity: isoechoic (1) Given size (<1.4 cm) and appearance, this nodule does NOT meet TI-RADS criteria for biopsy or dedicated follow-up. __________________________________________  An additional small, subcentimeter echogenic nodules present in the left mid gland. This nodule does not meet criteria for further evaluation.  IMPRESSION: Bilateral thyroid nodules which do not meet criteria for further evaluation. No further follow-up is required.  Thyroid U/S (04/01/2019): Narrative & Impression    CLINICAL DATA:  Other.  Dysphagia.  Follow-up thyroid nodules.  COMPARISON:  04/15/2017   FINDINGS: Parenchymal Echotexture: Mildly heterogenous Isthmus: Normal in size measures 0.5 cm in diameter, unchanged Right lobe: Normal in size measuring 4.3 x 1.3 x 1.8 cm, unchanged, previously, 4.2 x 1.4 x 1.7 cm Left lobe: Normal in size measuring 4.4 x 1.6 x 1.7 cm, unchanged, previously, 4.2 x 1.4 x 1.6 cm ___________________________________   Nodule # 1: Prior biopsy: No Location: Right; Inferior Maximum size: 1.5 cm; Other 2 dimensions: 1.5 x 0.7 cm, previously, 1.3 x 1.1 x 0.7 cm Composition: solid/almost completely solid (2) Echogenicity:  hypoechoic (2) Significant change in size (>/= 20% in two dimensions and minimal increase of 2 mm): Yes Change in features: Yes nodule now appears hypoechoic, previously, isoechoic Change in ACR TI-RADS risk category: Yes previously a TR3 nodule, currently a TR4 nodule.   **Given size (>/= 1.5 cm) and appearance, fine needle aspiration of this moderately suspicious nodule should be considered based on TI-RADS criteria. _________________________________________________________   The approximately 0.8 cm isoechoic ill-defined nodule/pseudonodule within the mid aspect the right lobe of the thyroid labeled 2), is unchanged compared to the 03/2017 examination, previously, 0.7 cm, and again does not meet imaging criteria to recommend percutaneous sampling or continued dedicated follow-up.   IMPRESSION: Nodule #1 within the inferior pole the right lobe of the thyroid has increased in size and now appears hypoechoic and as such has been up graded from a TR3 nodule to a TR4 nodule and now meets imaging criteria to recommend percutaneous sampling as indicated.    Electronically Signed   By: Sandi Mariscal M.D.   On: 04/02/2019 08:48     04/09/2019: FNA: Atypia of undetermined significance (Bethesda category III); Afirma: Suspicious (50% cancer risk)  06/08/2019: Right thyroid lobectomy: Adenomatous nodule, lymphocytic thyroiditis (benign)  PLAN:  1. Patient with history of subclinical thyrotoxicosis with elevated TSI antibodies, consistent with mild Graves' disease.  We checked a thyroid uptake and scan and the scan was uniform with exception of a small cold nodule in the right lobe of the thyroid.  The uptake was at the upper limit of normal.  At the time of the scan, her TSH is slightly low, but this worsened afterwards.  We started a low-dose methimazole, 5 mg daily.  She tolerated this well and felt a little better on it.  Dr. Harlow Asa advised  her to start this after her right thyroidectomy  (see next problem), performed for enlarging, compressing, cold thyroid nodule.  The nodule turned out to be benign. -TSH was normal 6 months ago Lab Results  Component Value Date   TSH 3.02 08/31/2020  -She continues to have fatigue and palpitations (she is on chronic beta-blockers for POTS).  Since last visit, however, she started to run/jog and she feels much better. -She came off methimazole and we discussed that her mild thyrotoxicosis most likely resolved after her thyroidectomy -We will recheck her TFTs now -We discussed about the possibility of needing to start levothyroxine, but the risk of hypothyroidism are lower as the time goes by -We will see her back in 1 year  2.  Cold thyroid nodule -Previously leave neck compression symptoms: Food getting stuck in her throat. Repeated ultrasound showed an increase in size of the nodule, which was called on the uptake and scan. We biopsied this nodule with inconclusive results, however, the Afirma molecular marker returned suspicious so I referred her to surgery.  She had right hemithyroidectomy in 06/2019 and final pathology was benign -No neck compression symptoms at this visit -Left thyroid lobe is palpable on exam today, but without palpable nodules -No further investigation is needed for this  Component     Latest Ref Rng & Units 03/02/2021  TSH     0.35 - 5.50 uIU/mL 3.27  Triiodothyronine,Free,Serum     2.3 - 4.2 pg/mL 3.3  T4,Free(Direct)     0.60 - 1.60 ng/dL 0.73   Normal TFTs.  Philemon Kingdom, MD PhD [facility]ocr[facility]

## 2021-03-02 NOTE — Patient Instructions (Signed)
Please stop at the lab.  Please come back for a follow-up appointment in 1 year.  

## 2021-03-07 ENCOUNTER — Other Ambulatory Visit: Payer: Self-pay | Admitting: Internal Medicine

## 2021-03-28 ENCOUNTER — Ambulatory Visit (AMBULATORY_SURGERY_CENTER): Payer: BC Managed Care – PPO

## 2021-03-28 ENCOUNTER — Encounter: Payer: Self-pay | Admitting: Gastroenterology

## 2021-03-28 ENCOUNTER — Other Ambulatory Visit: Payer: Self-pay

## 2021-03-28 VITALS — Ht 66.5 in | Wt 175.0 lb

## 2021-03-28 DIAGNOSIS — Z1211 Encounter for screening for malignant neoplasm of colon: Secondary | ICD-10-CM

## 2021-03-28 MED ORDER — PLENVU 140 G PO SOLR: 1.0000 | ORAL | 0 refills | Status: DC

## 2021-03-28 NOTE — Progress Notes (Signed)
Pre visit completed via phone call; Patient verified name, DOB, and address; No egg or soy allergy known to patient  No issues known to pt with past sedation with any surgeries or procedures----other than PONV Patient denies ever being told they had issues or difficulty with intubation-----other than PONV No FH of Malignant Hyperthermia Pt is not on diet pills Pt is not on home 02  Pt is not on blood thinners  Pt denies issues with constipation at this time; patient reports she takes Colace for relief No A fib or A flutter Pt is fully vaccinated  for Covid x 2; Coupon given to pt in PV today, Code to Pharmacy and NO PA's for preps discussed with pt in PV today  Discussed with pt there will be an out-of-pocket cost for prep and that varies from $0 to 70 +  dollars - pt verbalized understanding  Due to the COVID-19 pandemic we are asking patients to follow certain guidelines in PV and the [facility]   Pt aware of COVID protocols and [facility] guidelines

## 2021-04-12 ENCOUNTER — Encounter: Payer: BC Managed Care – PPO | Admitting: Gastroenterology

## 2021-04-13 ENCOUNTER — Encounter: Payer: Self-pay | Admitting: Gastroenterology

## 2021-04-13 ENCOUNTER — Ambulatory Visit (AMBULATORY_SURGERY_CENTER): Payer: BC Managed Care – PPO | Admitting: Gastroenterology

## 2021-04-13 VITALS — BP 126/88 | HR 77 | Temp 98.4°F | Resp 16 | Ht 66.5 in | Wt 175.0 lb

## 2021-04-13 DIAGNOSIS — Z1211 Encounter for screening for malignant neoplasm of colon: Secondary | ICD-10-CM | POA: Diagnosis not present

## 2021-04-13 MED ORDER — SODIUM CHLORIDE 0.9 % IV SOLN: 500.0000 mL | Freq: Once | INTRAVENOUS | Status: DC

## 2021-04-13 NOTE — Op Note (Signed)
[facility] Patient Name: Chelsey Sanford Procedure Date: 04/13/2021 2:48 PM MRN: [id number] Endoscopist: Mauri Pole , MD Age: 45 Referring MD:  Date of Birth: 1975/07/10 Gender: Female Account #: [id number] Procedure:                Colonoscopy Indications:              Screening for colorectal malignant neoplasm Medicines:                Monitored Anesthesia Care Procedure:                Pre-Anesthesia Assessment:                           - Prior to the procedure, a History and Physical                            was performed, and patient medications and                            allergies were reviewed. The patient's tolerance of                            previous anesthesia was also reviewed. The risks                            and benefits of the procedure and the sedation                            options and risks were discussed with the patient.                            All questions were answered, and informed consent                            was obtained. Prior Anticoagulants: The patient has                            taken no previous anticoagulant or antiplatelet                            agents. ASA Grade Assessment: II - A patient with                            mild systemic disease. After reviewing the risks                            and benefits, the patient was deemed in                            satisfactory condition to undergo the procedure.                           After obtaining informed consent, the colonoscope  was passed under direct vision. Throughout the                            procedure, the patient's blood pressure, pulse, and                            oxygen saturations were monitored continuously. The                            Olympus PCF-H190DL (#[id number]) Colonoscope was                            introduced through the anus and advanced to the the                            cecum,  identified by appendiceal orifice and                            ileocecal valve. The colonoscopy was performed                            without difficulty. The patient tolerated the                            procedure well. The quality of the bowel                            preparation was excellent. The ileocecal valve,                            appendiceal orifice, and rectum were photographed. Scope In: 2:55:35 PM Scope Out: 3:17:45 PM Scope Withdrawal Time: 0 hours 12 minutes 16 seconds  Total Procedure Duration: 0 hours 22 minutes 10 seconds  Findings:                 The perianal and digital rectal examinations were                            normal.                           The colon (entire examined portion) appeared normal. Complications:            No immediate complications. Estimated Blood Loss:     Estimated blood loss: none. Impression:               - The entire examined colon is normal.                           - No specimens collected. Recommendation:           - Patient has a contact number available for                            emergencies. The signs and symptoms of potential  delayed complications were discussed with the                            patient. Return to normal activities tomorrow.                            Written discharge instructions were provided to the                            patient.                           - Resume previous diet.                           - Continue present medications.                           - Repeat colonoscopy in 10 years for screening                            purposes. Mauri Pole, MD 04/13/2021 3:20:20 PM This report has been signed electronically.

## 2021-04-13 NOTE — Patient Instructions (Signed)
No repeat colonoscopy for 10 years for surveillance!! Resume previous diet and continue present medications.   YOU HAD AN ENDOSCOPIC PROCEDURE TODAY AT [facility]:   Refer to the procedure report that was given to you for any specific questions about what was found during the examination.  If the procedure report does not answer your questions, please call your gastroenterologist to clarify.  If you requested that your care partner not be given the details of your procedure findings, then the procedure report has been included in a sealed envelope for you to review at your convenience later.  YOU SHOULD EXPECT: Some feelings of bloating in the abdomen. Passage of more gas than usual.  Walking can help get rid of the air that was put into your GI tract during the procedure and reduce the bloating. If you had a lower endoscopy (such as a colonoscopy or flexible sigmoidoscopy) you may notice spotting of blood in your stool or on the toilet paper. If you underwent a bowel prep for your procedure, you may not have a normal bowel movement for a few days.  Please Note:  You might notice some irritation and congestion in your nose or some drainage.  This is from the oxygen used during your procedure.  There is no need for concern and it should clear up in a day or so.  SYMPTOMS TO REPORT IMMEDIATELY:  Following lower endoscopy (colonoscopy or flexible sigmoidoscopy):  Excessive amounts of blood in the stool  Significant tenderness or worsening of abdominal pains  Swelling of the abdomen that is new, acute  Fever of 100F or higher  For urgent or emergent issues, a gastroenterologist can be reached at any hour by calling [phone]. Do not use MyChart messaging for urgent concerns.    DIET:  We do recommend a small meal at first, but then you may proceed to your regular diet.  Drink plenty of fluids but you should avoid alcoholic beverages for 24 hours.  ACTIVITY:  You should  plan to take it easy for the rest of today and you should NOT DRIVE or use heavy machinery until tomorrow (because of the sedation medicines used during the test).    FOLLOW UP: Our staff will call the number listed on your records 48-72 hours following your procedure to check on you and address any questions or concerns that you may have regarding the information given to you following your procedure. If we do not reach you, we will leave a message.  We will attempt to reach you two times.  During this call, we will ask if you have developed any symptoms of COVID 19. If you develop any symptoms (ie: fever, flu-like symptoms, shortness of breath, cough etc.) before then, please call [phone].  If you test positive for Covid 19 in the 2 weeks post procedure, please call and report this information to [place].    If any biopsies were taken you will be contacted by phone or by letter within the next 1-3 weeks.  Please call us at [phone] if you have not heard about the biopsies in 3 weeks.    SIGNATURES/CONFIDENTIALITY: You and/or your care partner have signed paperwork which will be entered into your electronic medical record.  These signatures attest to the fact that that the information above on your After Visit Summary has been reviewed and is understood.  Full responsibility of the confidentiality of this discharge information lies with you and/or your care-partner.

## 2021-04-13 NOTE — Progress Notes (Signed)
Sedate, gd SR, tolerated procedure well, VSS, report to RN 

## 2021-04-13 NOTE — Progress Notes (Signed)
VS- Chelsey Sanford  Pt's states no medical or surgical changes since previsit or office visit.  

## 2021-04-13 NOTE — Progress Notes (Signed)
[facility] History and Physical   Primary Care Physician:  Binnie Rail, MD   Reason for Procedure:  Colorectal cancer screening  Plan:    Screening colonoscopy with possible interventions as needed     HPI: Chelsey Sanford is a very pleasant 45 y.o. female here for screening colonoscopy. Denies any nausea, vomiting, abdominal pain, melena or bright red blood per rectum  The risks and benefits as well as alternatives of endoscopic procedure(s) have been discussed and reviewed. All questions answered. The patient agrees to proceed.    Past Medical History:  Diagnosis Date   ALLERGIC RHINITIS    ANEMIA-NOS    hx of   Anxiety    Complication of anesthesia    Delayed gastric emptying    dx at [facility] by gastric emptying study per patient   DVT of axillary vein, acute ([place])    DYSAUTONOMIA    ENDOMETRIOSIS    GERD (gastroesophageal reflux disease) 82/50/5397   with certain foods due to esophageal paralysis   Headache(784.0)    Irritable bowel syndrome    Lumbar disc disease    Migraine    since 6th grade   PONV (postoperative nausea and vomiting)    scope patch works well   POTS (postural orthostatic tachycardia syndrome)    not triggered by anesthesia. usually triggered by dehydration   Seasonal allergies    Sickle cell trait ([place])    Thyroid disease    not on meds at this time (03/28/2021)- post parathyroidectomy    Past Surgical History:  Procedure Laterality Date   ANKLE RECONSTRUCTION Right 1993   BREAST BIOPSY Right 01/12/2015   fibroadenoma   CERVICAL FUSION  2021   COLONOSCOPY  2010   external provider-F/V-normal   ELBOW SURGERY Right 2015   SINUS EXPLORATION  2015   THYROID LOBECTOMY Right 06/08/2019   Procedure: RIGHT THYROID LOBECTOMY;  Surgeon: Armandina Gemma, MD;  Location: [place];  Service: General;  Laterality: Right;   [place]    Prior to Admission medications    Medication Sig Start Date End Date Taking? Authorizing Provider  cetirizine (ZYRTEC) 10 MG tablet Take 10 mg by mouth daily.   Yes [provider]  Lactobacillus-Inulin ([place] PO) Take 1 tablet by mouth daily.    Yes [provider]  pantoprazole (PROTONIX) 40 MG tablet TAKE 1 TABLET BY MOUTH DAILY 03/07/21  Yes Burns, Claudina Lick, MD  pindolol (VISKEN) 5 MG tablet TAKE 1 TABLET(5 MG) BY MOUTH TWICE DAILY 02/15/21  Yes Fay Records, MD  pyridostigmine (MESTINON) 60 MG tablet Take 1 tablet (60 mg total) by mouth 2 (two) times daily. 02/16/21  Yes Fay Records, MD  Cyanocobalamin 500 MCG/0.1ML SOLN 1 spray in one nostril qwk Patient taking differently: Place 500 mcg into alternate nostrils once a week. 1 spray in one nostril qwk 07/09/18   Burns, Claudina Lick, MD  diphenhydrAMINE (BENADRYL) 25 MG tablet Take 50 mg by mouth every 6 (six) hours as needed for itching or allergies. Patient not taking: Reported on 03/28/2021    [provider]  docusate sodium (COLACE) 100 MG capsule Take 100 mg by mouth daily as needed for mild constipation.    [provider]  rizatriptan (MAXALT-MLT) 10 MG disintegrating tablet TAKE 1 TABLET BY MOUTH AS NEEDED FOR MIGRAINE, MAY REPEAT IN 2 HOURS IF NEEDED 01/12/20   Binnie Rail, MD  magnesium oxide (  MAG-OX) 400 MG tablet Take 1 tablet (400 mg total) by mouth daily. 11/16/11 02/11/20  Rowe Clack, MD    Current Outpatient Medications  Medication Sig Dispense Refill   cetirizine (ZYRTEC) 10 MG tablet Take 10 mg by mouth daily.     Lactobacillus-Inulin (CULTURELLE DIGESTIVE HEALTH PO) Take 1 tablet by mouth daily.      pantoprazole (PROTONIX) 40 MG tablet TAKE 1 TABLET BY MOUTH DAILY 90 tablet 0   pindolol (VISKEN) 5 MG tablet TAKE 1 TABLET(5 MG) BY MOUTH TWICE DAILY 60 tablet 0   pyridostigmine (MESTINON) 60 MG tablet Take 1 tablet (60 mg total) by mouth 2 (two) times daily. 60 tablet 0   Cyanocobalamin 500  MCG/0.1ML SOLN 1 spray in one nostril qwk (Patient taking differently: Place 500 mcg into alternate nostrils once a week. 1 spray in one nostril qwk) 3 Bottle 3   diphenhydrAMINE (BENADRYL) 25 MG tablet Take 50 mg by mouth every 6 (six) hours as needed for itching or allergies. (Patient not taking: Reported on 03/28/2021)     docusate sodium (COLACE) 100 MG capsule Take 100 mg by mouth daily as needed for mild constipation.     rizatriptan (MAXALT-MLT) 10 MG disintegrating tablet TAKE 1 TABLET BY MOUTH AS NEEDED FOR MIGRAINE, MAY REPEAT IN 2 HOURS IF NEEDED 9 tablet 2   Current Facility-Administered Medications  Medication Dose Route Frequency Provider Last Rate Last Admin   0.9 %  sodium chloride infusion  500 mL Intravenous Once Tavon Corriher, Venia Minks, MD        Allergies as of 04/13/2021 - Review Complete 04/13/2021  Allergen Reaction Noted   Adhesive [tape] Rash and Other (See Comments) 03/03/2014   Sulfonamide derivatives Nausea And Vomiting    Doxycycline Nausea And Vomiting 10/04/2014   Sulfa antibiotics Nausea And Vomiting 09/22/2016   Amoxicillin Nausea And Vomiting and Rash    Erythromycin Nausea And Vomiting and Rash    Latex Rash 03/03/2014   Penicillins Nausea And Vomiting and Rash     Family History  Problem Relation Age of Onset   Thyroid disease Mother        hypothyroidism   Migraines Mother    Colon polyps Mother 7   Skin cancer Mother        malignant melanoma   Crohn's disease Maternal Grandmother    Parkinson's disease Paternal Grandmother    Arthritis/Rheumatoid Paternal Grandmother    Cancer Paternal Grandfather        skin   Parkinson's disease Paternal Grandfather    Breast cancer Cousin    COPD Neg Hx    Colon cancer Neg Hx    Esophageal cancer Neg Hx    Rectal cancer Neg Hx    Stomach cancer Neg Hx     Social History   Socioeconomic History   Marital status: Married    Spouse name: [name]   Number of children: 1   Years of education: 16    Highest education level: Not on file  Occupational History   Occupation: Retail banker: Self Employed    Comment: Research scientist (life sciences) and [employer]  Tobacco Use   Smoking status: Former    Types: Cigarettes    Quit date: 04/30/1996    Years since quitting: 24.9   Smokeless tobacco: Never  Vaping Use   Vaping Use: Never used  Substance and Sexual Activity   Alcohol use: Yes    Alcohol/week: 14.0 - 21.0 standard drinks  Types: 14 - 21 Standard drinks or equivalent per week   Drug use: No   Sexual activity: Yes    Birth control/protection: Pill  Other Topics Concern   Not on file  Social History Narrative   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  Physical Exam: Vital signs in last 24 hours: BP (!) 135/93    Pulse 82    Temp 98.4 F (36.9 C)    Ht 5' 6.5" (1.689 m)    Wt 175 lb (79.4 kg)    SpO2 98%    BMI 27.82 kg/m  General:   Alert, NAD Lungs:  Clear .   Heart:  Regular rate and rhythm Abdomen:  Soft, nontender and nondistended. Neuro/Psych:  Alert and cooperative. Normal mood and affect. A and O x 3  Reviewed labs, radiology imaging, old records and pertinent past GI work up  Patient is appropriate for planned procedure(s) and anesthesia in an ambulatory setting   K. Denzil Magnuson , MD [phone]

## 2021-04-17 ENCOUNTER — Telehealth: Payer: Self-pay | Admitting: *Deleted

## 2021-04-17 NOTE — Telephone Encounter (Signed)
°  Follow up Call-  Call back number 04/13/2021  Post procedure Call Back phone  # [phone]  Permission to leave phone message Yes  Some recent data might be hidden     Patient questions:  Do you have a fever, pain , or abdominal swelling? No. Pain Score  0 *  Have you tolerated food without any problems? Yes.    Have you been able to return to your normal activities? Yes.    Do you have any questions about your discharge instructions: Diet   No. Medications  No. Follow up visit  No.  Do you have questions or concerns about your Care? No.  Actions: * If pain score is 4 or above: No action needed, pain <4.

## 2021-04-18 ENCOUNTER — Other Ambulatory Visit: Payer: Self-pay | Admitting: Internal Medicine

## 2021-06-13 ENCOUNTER — Other Ambulatory Visit: Payer: Self-pay | Admitting: Internal Medicine

## 2021-07-03 ENCOUNTER — Ambulatory Visit: Payer: BC Managed Care – PPO | Admitting: Internal Medicine

## 2021-07-03 ENCOUNTER — Encounter: Payer: Self-pay | Admitting: Internal Medicine

## 2021-07-03 ENCOUNTER — Other Ambulatory Visit: Payer: Self-pay

## 2021-07-03 VITALS — BP 116/80 | HR 68 | Ht 66.0 in | Wt 184.2 lb

## 2021-07-03 DIAGNOSIS — E559 Vitamin D deficiency, unspecified: Secondary | ICD-10-CM

## 2021-07-03 DIAGNOSIS — Z79899 Other long term (current) drug therapy: Secondary | ICD-10-CM | POA: Diagnosis not present

## 2021-07-03 DIAGNOSIS — G90A Postural orthostatic tachycardia syndrome (POTS): Secondary | ICD-10-CM

## 2021-07-03 DIAGNOSIS — E0590 Thyrotoxicosis, unspecified without thyrotoxic crisis or storm: Secondary | ICD-10-CM | POA: Diagnosis not present

## 2021-07-03 DIAGNOSIS — G901 Familial dysautonomia [Riley-Day]: Secondary | ICD-10-CM | POA: Diagnosis not present

## 2021-07-03 DIAGNOSIS — R5383 Other fatigue: Secondary | ICD-10-CM

## 2021-07-03 MED ORDER — MAGNESIUM OXIDE 400 MG PO TABS: 400.0000 mg | ORAL_TABLET | Freq: Every day | ORAL | 3 refills | Status: AC

## 2021-07-03 MED ORDER — PYRIDOSTIGMINE BROMIDE 60 MG PO TABS: 60.0000 mg | ORAL_TABLET | Freq: Two times a day (BID) | ORAL | 0 refills | Status: DC

## 2021-07-03 MED ORDER — PINDOLOL 5 MG PO TABS: 5.0000 mg | ORAL_TABLET | Freq: Two times a day (BID) | ORAL | 0 refills | Status: DC

## 2021-07-03 NOTE — Progress Notes (Signed)
? ?Cardiology Office Note ? ? ?Date:  07/03/2021  ? ?ID:  Chelsey Sanford, DOB 05/16/1975, MRN [id number] ? ?PCP:  Binnie Rail, MD  ?Cardiologist:   Dorris Carnes, MD  ? ?F/U of autonomic dysfunciton   ? ?  ?History of Present Illness: ?Chelsey Sanford is a 46 y.o. female with a history of autonomic dysfunction    Pt also had hx Hashimoto's   s/p thyroid surgery.    ?I last saw her in [place] 2021 ? ?Since seen she says she is doing well.   Active   Exercising    Walks dogs   Does get dizzy occasionally    ? ? ?Current Meds  ?Medication Sig  ? cetirizine (ZYRTEC) 10 MG tablet Take 10 mg by mouth daily.  ? Cyanocobalamin 500 MCG/0.1ML SOLN 1 spray in one nostril qwk  ? diphenhydrAMINE (BENADRYL) 25 MG tablet Take 50 mg by mouth every 6 (six) hours as needed for itching or allergies.  ? docusate sodium (COLACE) 100 MG capsule Take 100 mg by mouth daily as needed for mild constipation.  ? Lactobacillus-Inulin (CULTURELLE DIGESTIVE HEALTH PO) Take 1 tablet by mouth daily.   ? pantoprazole (PROTONIX) 40 MG tablet TAKE 1 TABLET BY MOUTH DAILY  ? pindolol (VISKEN) 5 MG tablet Take 1 tablet (5 mg total) by mouth 2 (two) times daily. Pt. Needs to keep upcoming appt. In March with Dr. Harrington Challenger in order to receive further refills. Thank You.  ? pyridostigmine (MESTINON) 60 MG tablet Take 1 tablet (60 mg total) by mouth 2 (two) times daily. Please keep upcoming appt in March 2023 with Dr. Harrington Challenger before anymore refills. Thank you Final Attempt  ? rizatriptan (MAXALT-MLT) 10 MG disintegrating tablet TAKE 1 TABLET BY MOUTH AS NEEDED FOR MIGRAINE, MAY REPEAT IN 2 HOURS IF NEEDED  ? ? ? ?Allergies:   Adhesive [tape], Sulfonamide derivatives, Doxycycline, Sulfa antibiotics, Amoxicillin, Erythromycin, Latex, and Penicillins  ? ?Past Medical History:  ?Diagnosis Date  ? ALLERGIC RHINITIS   ? ANEMIA-NOS   ? hx of  ? Anxiety   ? Complication of anesthesia   ? Delayed gastric emptying   ? dx at [facility] by gastric emptying study per patient  ? DVT of axillary  vein, acute ([place])   ? DYSAUTONOMIA   ? ENDOMETRIOSIS   ? GERD (gastroesophageal reflux disease) 10/06/2011  ? with certain foods due to esophageal paralysis  ? Headache(784.0)   ? Irritable bowel syndrome   ? Lumbar disc disease   ? Migraine   ? since 6th grade  ? PONV (postoperative nausea and vomiting)   ? scope patch works well  ? POTS (postural orthostatic tachycardia syndrome)   ? not triggered by anesthesia. usually triggered by dehydration  ? Seasonal allergies   ? Sickle cell trait ([place])   ? Thyroid disease   ? not on meds at this time (03/28/2021)- post parathyroidectomy  ? ? ?Past Surgical History:  ?Procedure Laterality Date  ? ANKLE RECONSTRUCTION Right 1993  ? BREAST BIOPSY Right 01/12/2015  ? fibroadenoma  ? CERVICAL FUSION  2021  ? COLONOSCOPY  2010  ? external provider-F/V-normal  ? ELBOW SURGERY Right 2015  ? SINUS EXPLORATION  2015  ? THYROID LOBECTOMY Right 06/08/2019  ? Procedure: RIGHT THYROID LOBECTOMY;  Surgeon: Armandina Gemma, MD;  Location: [facility];  Service: General;  Laterality: Right;  ? TONSILLECTOMY  1996  ? [place]  ? ? ? ?Social History:  The  patient  reports that she quit smoking about 25 years ago. Her smoking use included cigarettes. She has never used smokeless tobacco. She reports current alcohol use of about 14.0 - 21.0 standard drinks per week. She reports that she does not use drugs.  ? ?Family History:  The patient's family history includes Arthritis/Rheumatoid in her paternal grandmother; Breast cancer in her cousin; Cancer in her paternal grandfather; Colon polyps (age of onset: 55) in her mother; Crohn's disease in her maternal grandmother; Migraines in her mother; Parkinson's disease in her paternal grandfather and paternal grandmother; Skin cancer in her mother; Thyroid disease in her mother.  ? ? ?ROS:  Please see the history of present illness. All other systems are reviewed and  Negative to the above problem except as noted.   ? ? ?PHYSICAL EXAM: ?VS:  BP 116/80 (BP Location: Left Arm, Patient Position: Sitting, Cuff Size: Normal)   Pulse 68   Ht '5\' 6"'$  (1.676 m)   Wt 184 lb 3.2 oz (83.6 kg)   SpO2 98%   BMI 29.73 kg/m?   ? ?GEN: Well nourished, well developed, in no acute distress  ?HEENT: normal  ?Neck: no JVD ?Cardiac: RRR; no murmurs  No LE edema  ?Respiratory:  clear to auscultation bilaterally ?GI: soft, nontender, nondistended, + BS   ?MS: no deformity Moving all extremities   ?Skin: warm and dry, no rash ?Neuro:  Strength and sensation are intact ?Psych: euthymic mood, full affect ? ? ?EKG:  EKG is ordered today.  SR 68 bpm  Nonspeicific ST changes   ? ? ?Lipid Panel ?   ?Component Value Date/Time  ? CHOL 202 (H) 02/11/2020 0936  ? TRIG 90 02/11/2020 0936  ? HDL 58 02/11/2020 0936  ? CHOLHDL 3.5 02/11/2020 0936  ? CHOLHDL 4 05/27/2018 1029  ? VLDL 24.2 05/27/2018 1029  ? [place] 128 (H) 02/11/2020 5465  ? ?  ? ?Wt Readings from Last 3 Encounters:  ?07/03/21 184 lb 3.2 oz (83.6 kg)  ?04/13/21 175 lb (79.4 kg)  ?03/28/21 175 lb (79.4 kg)  ?  ? ? ?ASSESSMENT AND PLAN: ? ?1  Autonomic dysfunction   Pt is doing very well     I recomm a trial of backing down on mestinon   1/2 tab at night   Then 1/2 tab in morning   Very slow.  Continue pindilol    Keep active as she is doing   Stay hydrated as she is doing    ? ?2  Hx thyroid dysfunciton      Off of replacment Rx   Follows with [name]  ? ? ?3  HL   Mild increase LDL  Will get lipomed    ? ?Plan for f/u in 1 year   ? ? ?Current medicines are reviewed at length with the patient today.  The patient does not have concerns regarding medicine ? ?Signed, ?Dorris Carnes, MD  ?07/03/2021 9:43 AM    ?[facility] ?[address], [place], [place]  [address] ?Phone: [phone]; Fax: [phone]  ? ? ?

## 2021-07-03 NOTE — Patient Instructions (Addendum)
Medication Instructions:  ?Start to taper the Mestinon ?Restart Magnesium  ? ?*If you need a refill on your cardiac medications before your next appointment, please call your pharmacy* ? ? ?Lab Work: ?CMET, VIT D, CBC, NMR, LIPOMED, ANA, LUPUS anticoagulant  ?If you have labs (blood work) drawn today and your tests are completely normal, you will receive your results only by: ?MyChart Message (if you have MyChart) OR ?A paper copy in the mail ?If you have any lab test that is abnormal or we need to change your treatment, we will call you to review the results. ? ? ?Testing/Procedures: ?NONE ? ? ?Follow-Up: ?At [facility], you and your health needs are our priority.  As part of our continuing mission to provide you with exceptional heart care, we have created designated Provider Care Teams.  These Care Teams include your primary Cardiologist (physician) and Advanced Practice Providers (APPs -  Physician Assistants and Nurse Practitioners) who all work together to provide you with the care you need, when you need it. ? ?We recommend signing up for the patient portal called "MyChart".  Sign up information is provided on this After Visit Summary.  MyChart is used to connect with patients for Virtual Visits (Telemedicine).  Patients are able to view lab/test results, encounter notes, upcoming appointments, etc.  Non-urgent messages can be sent to your provider as well.   ?To learn more about what you can do with MyChart, go to [email or web address].   ? ?Your next appointment:   ?1 year(s) ? ?The format for your next appointment:   ?In Person ? ?Provider:   ?Dorris Carnes, MD   ? ? ?Other Instructions ?  ?

## 2021-07-10 LAB — SPECIMEN STATUS REPORT

## 2021-07-12 LAB — COMPREHENSIVE METABOLIC PANEL
ALT: 14 IU/L (ref 0–32)
AST: 18 IU/L (ref 0–40)
Albumin/Globulin Ratio: 1.9 (ref 1.2–2.2)
Albumin: 4.6 g/dL (ref 3.8–4.8)
Alkaline Phosphatase: 58 IU/L (ref 44–121)
BUN/Creatinine Ratio: 12 (ref 9–23)
BUN: 8 mg/dL (ref 6–24)
Bilirubin Total: 0.5 mg/dL (ref 0.0–1.2)
CO2: 27 mmol/L (ref 20–29)
Calcium: 9.4 mg/dL (ref 8.7–10.2)
Chloride: 101 mmol/L (ref 96–106)
Creatinine, Ser: 0.68 mg/dL (ref 0.57–1.00)
Globulin, Total: 2.4 g/dL (ref 1.5–4.5)
Glucose: 100 mg/dL — ABNORMAL HIGH (ref 70–99)
Potassium: 4.5 mmol/L (ref 3.5–5.2)
Sodium: 138 mmol/L (ref 134–144)
Total Protein: 7 g/dL (ref 6.0–8.5)
eGFR: 109 mL/min/{1.73_m2} (ref >59)

## 2021-07-12 LAB — CBC
Hematocrit: 40.6 % (ref 34.0–46.6)
Hemoglobin: 13.4 g/dL (ref 11.1–15.9)
MCH: 28.8 pg (ref 26.6–33.0)
MCHC: 33 g/dL (ref 31.5–35.7)
MCV: 87 fL (ref 79–97)
Platelets: 271 10*3/uL (ref 150–450)
RBC: 4.66 x10E6/uL (ref 3.77–5.28)
RDW: 13.1 % (ref 11.7–15.4)
WBC: 7.4 10*3/uL (ref 3.4–10.8)

## 2021-07-12 LAB — APOLIPOPROTEIN B: Apolipoprotein B: 122 mg/dL — ABNORMAL HIGH (ref <90)

## 2021-07-12 LAB — VITAMIN D 25 HYDROXY (VIT D DEFICIENCY, FRACTURES): Vit D, 25-Hydroxy: 40.1 ng/mL (ref 30.0–100.0)

## 2021-07-12 LAB — NMR, LIPOPROFILE
Cholesterol, Total: 249 mg/dL — ABNORMAL HIGH (ref 100–199)
HDL Particle Number: 45.8 umol/L (ref >=30.5)
HDL-C: 66 mg/dL (ref >39)
LDL Particle Number: 1890 nmol/L — ABNORMAL HIGH (ref <1000)
LDL Size: 20.5 nm — ABNORMAL LOW (ref >20.5)
LDL-C (NIH Calc): 159 mg/dL — ABNORMAL HIGH (ref 0–99)
LP-IR Score: 69 — ABNORMAL HIGH (ref <=45)
Small LDL Particle Number: 780 nmol/L — ABNORMAL HIGH (ref <=527)
Triglycerides: 137 mg/dL (ref 0–149)

## 2021-07-12 LAB — LUPUS ANTICOAGULANT PANEL

## 2021-07-12 LAB — ANA: Anti Nuclear Antibody (ANA): NEGATIVE

## 2021-07-12 LAB — LIPOPROTEIN A (LPA): Lipoprotein (a): 52.2 nmol/L (ref <75.0)

## 2021-08-14 ENCOUNTER — Encounter: Payer: Self-pay | Admitting: Internal Medicine

## 2021-08-14 NOTE — Progress Notes (Signed)
Outside notes received. Information abstracted. Notes sent to scan.  

## 2021-09-11 ENCOUNTER — Other Ambulatory Visit: Payer: Self-pay | Admitting: Internal Medicine

## 2021-12-15 ENCOUNTER — Other Ambulatory Visit: Payer: Self-pay | Admitting: Internal Medicine

## 2021-12-21 ENCOUNTER — Other Ambulatory Visit: Payer: Self-pay | Admitting: Internal Medicine

## 2021-12-26 NOTE — Progress Notes (Unsigned)
Subjective:    Patient ID: Chelsey Sanford, female    DOB: [birth year], 46 y.o.   MRN: [id number]     HPI Chelsey Sanford is here for follow up of her chronic medical problems, including GERD, migraines  Joints starting to ache. She is still exercising - doing some running, always walk.    Medications and allergies reviewed with patient and updated if appropriate.  Current Outpatient Medications on File Prior to Visit  Medication Sig Dispense Refill   cetirizine (ZYRTEC) 10 MG tablet Take 10 mg by mouth daily.     clobetasol ointment (TEMOVATE) 0.05 %      Cyanocobalamin 500 MCG/0.1ML SOLN 1 spray in one nostril qwk 3 Bottle 3   diphenhydrAMINE (BENADRYL) 25 MG tablet Take 50 mg by mouth every 6 (six) hours as needed for itching or allergies.     docusate sodium (COLACE) 100 MG capsule Take 100 mg by mouth daily as needed for mild constipation.     ibuprofen (ADVIL) 600 MG tablet      Lactobacillus-Inulin (CULTURELLE DIGESTIVE HEALTH PO) Take 1 tablet by mouth daily.      magnesium oxide (MAG-OX) 400 MG tablet Take 1 tablet (400 mg total) by mouth daily. 90 tablet 3   pantoprazole (PROTONIX) 40 MG tablet TAKE 1 TABLET BY MOUTH DAILY 90 tablet 0   pindolol (VISKEN) 5 MG tablet Take 1 tablet (5 mg total) by mouth 2 (two) times daily. 180 tablet 0   pyridostigmine (MESTINON) 60 MG tablet Take 1 tablet (60 mg total) by mouth 2 (two) times daily. Begin to taper as directed 180 tablet 0   rizatriptan (MAXALT-MLT) 10 MG disintegrating tablet TAKE 1 TABLET BY MOUTH AS NEEDED FOR MIGRAINE, MAY REPEAT IN 2 HOURS IF NEEDED 9 tablet 2   No current facility-administered medications on file prior to visit.     Review of Systems  Constitutional:  Negative for fever.  Respiratory:  Negative for cough, shortness of breath and wheezing.   Cardiovascular:  Negative for chest pain, palpitations and leg swelling.  Neurological:  Positive for headaches (sinus and migraines). Negative for light-headedness.        Objective:   Vitals:   12/27/21 1003  BP: 122/82  Pulse: (!) 57  Temp: 98 F (36.7 C)  SpO2: 99%   BP Readings from Last 3 Encounters:  12/27/21 122/82  07/03/21 116/80  04/13/21 126/88   Wt Readings from Last 3 Encounters:  07/03/21 184 lb 3.2 oz (83.6 kg)  04/13/21 175 lb (79.4 kg)  03/28/21 175 lb (79.4 kg)   There is no height or weight on file to calculate BMI.    Physical Exam Constitutional:      General: She is not in acute distress.    Appearance: Normal appearance.  HENT:     Head: Normocephalic and atraumatic.  Eyes:     Conjunctiva/sclera: Conjunctivae normal.  Cardiovascular:     Rate and Rhythm: Normal rate and regular rhythm.     Heart sounds: Normal heart sounds. No murmur heard. Pulmonary:     Effort: Pulmonary effort is normal. No respiratory distress.     Breath sounds: Normal breath sounds. No wheezing.  Musculoskeletal:     Cervical back: Neck supple.     Right lower leg: No edema.     Left lower leg: No edema.  Lymphadenopathy:     Cervical: No cervical adenopathy.  Skin:    General: Skin is warm and dry.  Findings: No rash.  Neurological:     Mental Status: She is alert. Mental status is at baseline.  Psychiatric:        Mood and Affect: Mood normal.        Behavior: Behavior normal.        Lab Results  Component Value Date   WBC 7.4 07/03/2021   HGB 13.4 07/03/2021   HCT 40.6 07/03/2021   PLT 271 07/03/2021   GLUCOSE 100 (H) 07/03/2021   CHOL 202 (H) 02/11/2020   TRIG 90 02/11/2020   HDL 58 02/11/2020   LDLCALC 128 (H) 02/11/2020   ALT 14 07/03/2021   AST 18 07/03/2021   NA 138 07/03/2021   K 4.5 07/03/2021   CL 101 07/03/2021   CREATININE 0.68 07/03/2021   BUN 8 07/03/2021   CO2 27 07/03/2021   TSH 3.27 03/02/2021   INR 0.9 09/14/2011     Assessment & Plan:    See Problem List for Assessment and Plan of chronic medical problems.

## 2021-12-26 NOTE — Patient Instructions (Addendum)
       Medications changes include :   none   Your prescription(s) have been sent to your pharmacy.      Return in about 1 year (around 12/28/2022) for Physical Exam.

## 2021-12-27 ENCOUNTER — Encounter: Payer: Self-pay | Admitting: Internal Medicine

## 2021-12-27 ENCOUNTER — Ambulatory Visit: Payer: BC Managed Care – PPO | Admitting: Internal Medicine

## 2021-12-27 VITALS — BP 122/82 | HR 57 | Temp 98.0°F

## 2021-12-27 DIAGNOSIS — G43809 Other migraine, not intractable, without status migrainosus: Secondary | ICD-10-CM

## 2021-12-27 DIAGNOSIS — K219 Gastro-esophageal reflux disease without esophagitis: Secondary | ICD-10-CM

## 2021-12-27 MED ORDER — PANTOPRAZOLE SODIUM 40 MG PO TBEC: 40.0000 mg | DELAYED_RELEASE_TABLET | Freq: Every day | ORAL | 3 refills | Status: DC

## 2021-12-27 MED ORDER — RIZATRIPTAN BENZOATE 10 MG PO TBDP
ORAL_TABLET | ORAL | 11 refills | Status: AC
Start: 2021-12-27 — End: ?

## 2021-12-27 NOTE — Assessment & Plan Note (Signed)
Chronic Intermittent migraines Continue Maxalt 10 mg daily as needed

## 2021-12-27 NOTE — Assessment & Plan Note (Signed)
Chronic GERD controlled Continue pantoprazole 40 mg daily 

## 2022-01-24 ENCOUNTER — Other Ambulatory Visit: Payer: Self-pay | Admitting: Internal Medicine

## 2022-01-24 DIAGNOSIS — Z1231 Encounter for screening mammogram for malignant neoplasm of breast: Secondary | ICD-10-CM

## 2022-02-01 DIAGNOSIS — M7712 Lateral epicondylitis, left elbow: Secondary | ICD-10-CM | POA: Diagnosis not present

## 2022-02-08 DIAGNOSIS — M25622 Stiffness of left elbow, not elsewhere classified: Secondary | ICD-10-CM | POA: Diagnosis not present

## 2022-02-12 DIAGNOSIS — Z1389 Encounter for screening for other disorder: Secondary | ICD-10-CM | POA: Diagnosis not present

## 2022-02-12 DIAGNOSIS — Z130 Encounter for screening for diseases of the blood and blood-forming organs and certain disorders involving the immune mechanism: Secondary | ICD-10-CM | POA: Diagnosis not present

## 2022-02-12 DIAGNOSIS — Z01419 Encounter for gynecological examination (general) (routine) without abnormal findings: Secondary | ICD-10-CM | POA: Diagnosis not present

## 2022-02-20 DIAGNOSIS — D2272 Melanocytic nevi of left lower limb, including hip: Secondary | ICD-10-CM | POA: Diagnosis not present

## 2022-02-20 DIAGNOSIS — D225 Melanocytic nevi of trunk: Secondary | ICD-10-CM | POA: Diagnosis not present

## 2022-02-20 DIAGNOSIS — L578 Other skin changes due to chronic exposure to nonionizing radiation: Secondary | ICD-10-CM | POA: Diagnosis not present

## 2022-02-20 DIAGNOSIS — D2261 Melanocytic nevi of right upper limb, including shoulder: Secondary | ICD-10-CM | POA: Diagnosis not present

## 2022-03-02 DIAGNOSIS — Z1231 Encounter for screening mammogram for malignant neoplasm of breast: Secondary | ICD-10-CM

## 2022-03-05 ENCOUNTER — Ambulatory Visit
Admission: RE | Admit: 2022-03-05 | Discharge: 2022-03-05 | Disposition: A | Discharge status: Home / Self Care | Payer: BC Managed Care – PPO | Source: Ambulatory Visit | Attending: Internal Medicine | Admitting: Internal Medicine

## 2022-03-05 DIAGNOSIS — Z1231 Encounter for screening mammogram for malignant neoplasm of breast: Secondary | ICD-10-CM

## 2022-03-06 ENCOUNTER — Encounter: Payer: Self-pay | Admitting: Internal Medicine

## 2022-03-06 ENCOUNTER — Ambulatory Visit: Payer: BC Managed Care – PPO | Admitting: Internal Medicine

## 2022-03-06 VITALS — BP 120/64 | HR 72 | Ht 66.0 in | Wt 183.6 lb

## 2022-03-06 DIAGNOSIS — E0500 Thyrotoxicosis with diffuse goiter without thyrotoxic crisis or storm: Secondary | ICD-10-CM

## 2022-03-06 DIAGNOSIS — E041 Nontoxic single thyroid nodule: Secondary | ICD-10-CM

## 2022-03-06 DIAGNOSIS — E890 Postprocedural hypothyroidism: Secondary | ICD-10-CM

## 2022-03-06 LAB — T3, FREE: T3, Free: 3.5 pg/mL (ref 2.3–4.2)

## 2022-03-06 LAB — TSH: TSH: 2.53 u[IU]/mL (ref 0.35–5.50)

## 2022-03-06 LAB — T4, FREE: Free T4: 0.87 ng/dL (ref 0.60–1.60)

## 2022-03-06 NOTE — Patient Instructions (Signed)
Please stop at the lab.  Please come back for a follow-up appointment in 1 year.  

## 2022-03-06 NOTE — Progress Notes (Signed)
Patient ID: Chelsey Sanford, female   DOB: 1975-05-25, 46 y.o.   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  Last visit 1 year ago.  Interim history: Before last visit she started to exercise on her Peloton treadmill - about 30 minutes a day, 12-minute mile.  She continues with this, but she now c/o fatigue and increased hunger ("I just want to sleep and eat"). She stopped her Mestinon since last OV. She still has menses. She has aches and pains. She started Mg for constipation. Her daughter is applying for colleges now.  Reviewed history: Patient was found to have a low TSH on her annual physical exam in 11/2016. She was getting annual TFTs until then due to a family history of hypothyroidism in her mother.  The rest of her TSH levels have been normal since 2008.  Her thyroid tests initially improved in 02/2017, but the latest set of tests worsened again (subclinical thyrotoxicosis).  We started 5 mg of methimazole daily in 05/2017.  She started to feel better afterwards but she was lost for follow-up for 1 year and 7 months afterwards.  She then had R thyroidectomy in 06/2019 for a thyroid nodule with inconclusive Bx. Final pathology was benign.  She was able to stop methimazole after her thyroid surgery.  We did not have to start levothyroxine afterwards.  Reviewed her TFTs: Lab Results  Component Value Date   TSH 3.27 03/02/2021   TSH 3.02 08/31/2020   TSH 3.940 02/11/2020   TSH 2.77 09/17/2019   TSH 3.16 08/05/2019   TSH 2.17 05/06/2019   TSH 2.58 02/27/2019   TSH 3.02 12/24/2017   TSH 0.18 (L) 07/24/2017   TSH 0.01 (L) 06/19/2017   FREET4 0.73 03/02/2021   FREET4 0.82 08/31/2020   FREET4 1.18 02/11/2020   FREET4 0.78 09/17/2019   FREET4 0.68 08/05/2019   FREET4 1.13 05/06/2019   FREET4 0.87 02/27/2019   FREET4 0.72 12/24/2017   FREET4 0.86 07/24/2017   FREET4 1.20  06/19/2017   T3FREE 3.3 03/02/2021   T3FREE 3.5 08/31/2020   T3FREE 3.3 02/11/2020   T3FREE 3.1 09/17/2019   T3FREE 3.1 08/05/2019   T3FREE 3.5 05/06/2019   T3FREE 3.3 02/27/2019   T3FREE 3.5 12/24/2017   T3FREE 3.2 07/24/2017   T3FREE 5.2 (H) 06/19/2017   Her TPO antibodies were elevated: Component     Latest Ref Rng & Units 12/26/2016  Thyroperoxidase Ab SerPl-aCnc     <9 IU/mL 129 (H)  Thyroglobulin Ab     <2 IU/mL 1   Her Graves' antibodies are elevated: Lab Results  Component Value Date   TSI 441 (H) 03/07/2017   Reviewed the rest of her studies/procedures: We obtained a thyroid uptake and scan (04/03/2017): Uniform scan with the exception of a small right cold nodule, uptake at the upper limit of normal, 29%.  This was consistent with possible mild Graves' disease.  We then checked a thyroid ultrasound (04/15/2017): Small, 1.2 cm isoechoic solid nodule in the right lobe.  Also, small, subcentimeter echogenic left nodules.  None of the nodules meet criteria for biopsy or follow-up.  We repeated a thyroid ultrasound (04/01/2019): Her dominant right thyroid nodule has increased (1.5 cm X 1.5 x 0.7 cm) in size and risk.  Biopsy was recommended.  FNA (04/09/2019): Inconclusive: Atypia of undetermined significance (Bethesda category III); Afirma molecular marker: Suspicious (50% cancer risk)  I referred  her to surgery and she had Right thyroid lobectomy (06/08/2019): Benign nodule, lymphocytic thyroiditis.  Pt denies: - hoarseness - dysphagia - choking - SOB with lying down  Pt does have a FH of thyroid ds. In Mother >> hypothyroidism. No FH of thyroid cancer. No h/o radiation tx to head or neck. No herbal supplements. No Biotin use. No recent steroids use.   Pt. also has a history of POTS dx 2008 - managed by cardiology.  Was on Florinef and Mestinon >> now off Florinef. On Pindolol and Mestinon. She also has B12 and vitamin D deficiency. She has longstanding anemia. Also,  eosinophilic esophagitis and hiatal hernia >> on Protonix.  ROS: + See HPI  I reviewed pt's medications, allergies, PMH, social hx, family hx, and changes were documented in the history of present illness. Otherwise, unchanged from my initial visit note.  Past Medical History:  Diagnosis Date   ALLERGIC RHINITIS    ANEMIA-NOS    hx of   Anxiety    Complication of anesthesia    Delayed gastric emptying    dx at [facility] by gastric emptying study per patient   DVT of axillary vein, acute (HCC)    DYSAUTONOMIA    ENDOMETRIOSIS    GERD (gastroesophageal reflux disease) 51/76/1607   with certain foods due to esophageal paralysis   Headache(784.0)    Irritable bowel syndrome    Lumbar disc disease    Migraine    since 6th grade   PONV (postoperative nausea and vomiting)    scope patch works well   POTS (postural orthostatic tachycardia syndrome)    not triggered by anesthesia. usually triggered by dehydration   Seasonal allergies    Sickle cell trait ([place])    Thyroid disease    not on meds at this time (03/28/2021)- post parathyroidectomy   Past Surgical History:  Procedure Laterality Date   ANKLE RECONSTRUCTION Right 1993   BREAST BIOPSY Right 01/12/2015   fibroadenoma   CERVICAL FUSION  2021   COLONOSCOPY  2010   external provider-F/V-normal   ELBOW SURGERY Right 2015   SINUS EXPLORATION  2015   THYROID LOBECTOMY Right 06/08/2019   Procedure: RIGHT THYROID LOBECTOMY;  Surgeon: Armandina Gemma, MD;  Location: [facility];  Service: General;  Laterality: Right;   [place]   Social History   Socioeconomic History   Marital status: Married    Spouse name: [name]   Number of children: 1   Years of education: 16   Highest education level: Not on file  Occupational History   Occupation: Retail banker: Self Employed    Comment: Research scientist (life sciences) and [employer]  Tobacco Use   Smoking status: Former  Smoker    Last attempt to quit: 04/30/1996    Years since quitting: 20.8   Smokeless tobacco: Never Used  Substance and Sexual Activity   Alcohol use: Yes    Comment: 1 time weekly - 2 drinks of winr   Drug use: No   Sexual activity: Yes    Birth control/protection: Pill  Other Topics Concern   Not on file  Social History Narrative   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     clobetasol ointment (TEMOVATE) 0.05 %      Cyanocobalamin 500 MCG/0.1ML SOLN 1 spray in one nostril qwk 3 Bottle 3   diphenhydrAMINE (BENADRYL) 25 MG tablet Take 50 mg by mouth every 6 (six) hours as needed for itching or allergies.     docusate sodium (COLACE) 100 MG capsule Take 100 mg by mouth daily as needed for mild constipation.     ibuprofen (ADVIL) 600 MG tablet      Lactobacillus-Inulin (CULTURELLE DIGESTIVE HEALTH PO) Take 1 tablet by mouth daily.      magnesium oxide (MAG-OX) 400 MG tablet Take 1 tablet (400 mg total) by mouth daily. 90 tablet 3   pantoprazole (PROTONIX) 40 MG tablet Take 1 tablet (40 mg total) by mouth daily. 90 tablet 3   pindolol (VISKEN) 5 MG tablet Take 1 tablet (5 mg total) by mouth 2 (two) times daily. 180 tablet 0   pyridostigmine (MESTINON) 60 MG tablet Take 1 tablet (60 mg total) by mouth 2 (two) times daily. Begin to taper as directed 180 tablet 0   rizatriptan (MAXALT-MLT) 10 MG disintegrating tablet TAKE 1 TABLET BY MOUTH AS NEEDED FOR MIGRAINE, MAY REPEAT IN 2 HOURS IF NEEDED 9 tablet 11   No current facility-administered medications on file prior to visit.   Allergies  Allergen Reactions   Adhesive [Tape] Rash and Other (See Comments)    Skin blistered and  disintegrated after epidural (looked like 3rd degree burn) - possibly due to tape and/or tegaderm - do not use anything with adhesive   Sulfonamide Derivatives Nausea And Vomiting   Doxycycline Nausea And Vomiting   Sulfa Antibiotics Nausea And Vomiting   Amoxicillin Nausea And Vomiting and Rash   Erythromycin Nausea And Vomiting and Rash   Latex Rash    See notes under adhesive tape   Penicillins Nausea And Vomiting and Rash    Has patient had a PCN reaction causing immediate rash, facial/tongue/throat swelling, SOB or lightheadedness with hypotension: Yes Has patient had a PCN reaction causing severe rash involving mucus membranes or skin necrosis: No Has patient had a PCN reaction that required hospitalization: No Has patient had a PCN reaction occurring within the last 10 years: No If all of the above answers are "NO", then may proceed with Cephalosporin use.   Family History  Problem Relation Age of Onset   Thyroid disease Mother        hypothyroidism   Migraines Mother    Colon polyps Mother 55   Skin cancer Mother        malignant melanoma   Crohn's disease Maternal Grandmother    Parkinson's disease Paternal Grandmother    Arthritis/Rheumatoid Paternal Grandmother    Cancer Paternal Grandfather        skin   Parkinson's disease Paternal Grandfather    Breast cancer Cousin    COPD Neg Hx    Colon cancer Neg Hx    Esophageal cancer Neg Hx    Rectal cancer Neg Hx    Stomach cancer Neg Hx     PE: There were no vitals taken for this visit. Wt Readings from Last 3 Encounters:  07/03/21 184 lb 3.2 oz (83.6 kg)  04/13/21 175 lb (79.4 kg)  03/28/21 175 lb (79.4 kg)   Constitutional: overweight, in NAD Eyes: EOMI, no exophthalmos ENT: no neck masses palpated, thyroidectomy scar healed, no cervical  lymphadenopathy Cardiovascular: RRR, No MRG Respiratory: CTA B Musculoskeletal: no deformities Skin: moist, warm, no rashes Neurological: + Very faint tremor with  outstretched hands  ASSESSMENT: 1.  Graves' disease - Thyroid uptake and scan (04/03/2017): Upper normal 24 hour radio iodine uptake of 29%. Tiny cold nodule at inferior pole of RIGHT thyroid lobe without dominant thyroid mass.  2.  Small thyroid nodule - now s/p right hemithyroidectomy - Thyroid ultrasound (04/15/2017): Parenchymal Echotexture: Moderately heterogenous Isthmus: 0.3 cm Right lobe: 4.2 x 1.4 x 1.7 cm Left lobe: 4.2 x 1.4 x 1.6 cm _________________________________________________________ Nodule # 1: Location: Right; Inferior Maximum size: 1.2 cm; Other 2 dimensions: 0.8 x 0.8 cm Composition: solid/almost completely solid (2) Echogenicity: isoechoic (1) Given size (<1.4 cm) and appearance, this nodule does NOT meet TI-RADS criteria for biopsy or dedicated follow-up. __________________________________________  An additional small, subcentimeter echogenic nodules present in the left mid gland. This nodule does not meet criteria for further evaluation.  IMPRESSION: Bilateral thyroid nodules which do not meet criteria for further evaluation. No further follow-up is required.  Thyroid U/S (04/01/2019): Parenchymal Echotexture: Mildly heterogenous Isthmus: Normal in size measures 0.5 cm in diameter, unchanged Right lobe: Normal in size measuring 4.3 x 1.3 x 1.8 cm, unchanged, previously, 4.2 x 1.4 x 1.7 cm Left lobe: Normal in size measuring 4.4 x 1.6 x 1.7 cm, unchanged, previously, 4.2 x 1.4 x 1.6 cm ___________________________________   Nodule # 1: Prior biopsy: No Location: Right; Inferior Maximum size: 1.5 cm; Other 2 dimensions: 1.5 x 0.7 cm, previously, 1.3 x 1.1 x 0.7 cm Composition: solid/almost completely solid (2) Echogenicity: hypoechoic (2) Significant change in size (>/= 20% in two dimensions and minimal increase of 2 mm): Yes Change in features: Yes nodule now appears hypoechoic, previously, isoechoic Change in ACR TI-RADS risk category: Yes  previously a TR3 nodule, currently a TR4 nodule.   **Given size (>/= 1.5 cm) and appearance, fine needle aspiration of this moderately suspicious nodule should be considered based on TI-RADS criteria. _________________________________________________________   The approximately 0.8 cm isoechoic ill-defined nodule/pseudonodule within the mid aspect the right lobe of the thyroid labeled 2), is unchanged compared to the 03/2017 examination, previously, 0.7 cm, and again does not meet imaging criteria to recommend percutaneous sampling or continued dedicated follow-up.   IMPRESSION: Nodule #1 within the inferior pole the right lobe of the thyroid has increased in size and now appears hypoechoic and as such has been up graded from a TR3 nodule to a TR4 nodule and now meets imaging criteria to recommend percutaneous sampling as indicated.    04/09/2019: FNA: Atypia of undetermined significance (Bethesda category III); Afirma: Suspicious (50% cancer risk)  06/08/2019: Right thyroid lobectomy: Adenomatous nodule, lymphocytic thyroiditis (benign)  PLAN:  1. Patient with history of subclinical thyrotoxicosis with elevated TSI antibodies, consistent with mild Graves' disease.  Thyroid uptake and scan showed an uptake at the upper limit of normal and a uniform scan with the exception of a small cold nodule in the right lobe of the thyroid.  At the time of the scan, TSH was slightly low, but this worsened afterwards.  We had to start low-dose methimazole.  However, we ended up proceeding with right hemithyroidectomy.  Afterwards, her subclinical thyrotoxicosis resolved. -At last visit, TSH was normal Lab Results  Component Value Date   TSH 3.27 03/02/2021  -At previous visits she had fatigue and palpitations (she is on chronic beta-blockers for POTS).  However, before last visit she started to exercise and  she was feeling much better. -She continues to exercise but feels more fatigued and has an  increased appetite.  We discussed about possible culprits for these.  I did recommend a multivitamin but if she does not feel better afterwards, discussed about seeing PCP for a full evaluation including multivitamins, ferritin, etc.  Her menstrual cycles are regular.  No signs of perimenopause.  She did have a recent appointment with OB/GYN and she was not anemic at that time. -We will recheck her TFTs today -We did asked about the possibility of needing to start levothyroxine but this becomes unlikely as the time goes on -We will see her back in 1 year  2.  History of cold thyroid nodule -History of a right 1.5 cm thyroid nodule, slightly increased in size on the ultrasound from 2020 -She previously had food getting stuck in her throat  -She had right hemithyroidectomy in 06/2019-benign pathology -Left thyroid lobe is palpable on exam but without nodules -TFTs are normal -No further investigation is needed for this  Component     Latest Ref Rng 03/06/2022  T4,Free(Direct)     0.60 - 1.60 ng/dL 0.87   Triiodothyronine,Free,Serum     2.3 - 4.2 pg/mL 3.5   TSH     0.35 - 5.50 uIU/mL 2.53    Thyroid tests are normal, improved.  Philemon Kingdom, MD PhD [facility]

## 2022-03-15 DIAGNOSIS — M7712 Lateral epicondylitis, left elbow: Secondary | ICD-10-CM | POA: Diagnosis not present

## 2022-04-24 DIAGNOSIS — M25522 Pain in left elbow: Secondary | ICD-10-CM | POA: Diagnosis not present

## 2022-05-09 DIAGNOSIS — M7712 Lateral epicondylitis, left elbow: Secondary | ICD-10-CM | POA: Diagnosis not present

## 2022-05-10 ENCOUNTER — Telehealth: Payer: Self-pay | Admitting: *Deleted

## 2022-05-10 NOTE — Telephone Encounter (Signed)
Primary Cardiologist:Paula Harrington Challenger, MD   Preoperative team, please contact this patient and set up a phone call appointment for further preoperative risk assessment. Please obtain consent and complete medication review. Thank you for your help.   I confirm that guidance regarding antiplatelet and oral anticoagulation therapy has been completed and, if necessary, noted below (none requested)   Emmaline Life, NP-C  05/10/2022, 4:32 PM [address] Office [phone] Fax [phone]

## 2022-05-10 NOTE — Telephone Encounter (Signed)
   Pre-operative Risk Assessment    Patient Name: Chelsey Sanford  DOB: Jan 20, 1976 MRN: [id number]      Request for Surgical Clearance    Procedure:   LEFT ELBOW LATERAL OPEN DEBRIDEMENT WITH EXTENSOR TENDON REPAIR  Date of Surgery:  Clearance 06/11/22                                 Surgeon:  DR. Victorino December Surgeon's Group or Practice Name:  [facility] Phone number:  [phone] ATTN: [place] Fax number:  [phone]   Type of Clearance Requested:   - Medical ; NO MEDICATIONS LISTED AS NEEDING TO BE HELD   Type of Anesthesia:   CHOICE   Additional requests/questions:    Jiles Prows   05/10/2022, 2:59 PM

## 2022-05-10 NOTE — Telephone Encounter (Signed)
Pt agreeable to tele pre op appt 06/01/22 @ 9:20. Med rec and consent are done.

## 2022-05-10 NOTE — Telephone Encounter (Signed)
Pt agreeable to tele pre op appt 06/01/22 @ 9:20. Med rec and consent are done.    Patient Consent for Virtual Visit        Chelsey Sanford has provided verbal consent on 05/10/2022 for a virtual visit (video or telephone).   CONSENT FOR VIRTUAL VISIT FOR:  Chelsey Sanford  By participating in this virtual visit I agree to the following:  I hereby voluntarily request, consent and authorize [facility] and its employed or contracted physicians, physician assistants, nurse practitioners or other licensed health care professionals (the Practitioner), to provide me with telemedicine health care services (the "Services") as deemed necessary by the treating Practitioner. I acknowledge and consent to receive the Services by the Practitioner via telemedicine. I understand that the telemedicine visit will involve communicating with the Practitioner through live audiovisual communication technology and the disclosure of certain medical information by electronic transmission. I acknowledge that I have been given the opportunity to request an in-person assessment or other available alternative prior to the telemedicine visit and am voluntarily participating in the telemedicine visit.  I understand that I have the right to withhold or withdraw my consent to the use of telemedicine in the course of my care at any time, without affecting my right to future care or treatment, and that the Practitioner or I may terminate the telemedicine visit at any time. I understand that I have the right to inspect all information obtained and/or recorded in the course of the telemedicine visit and may receive copies of available information for a reasonable fee.  I understand that some of the potential risks of receiving the Services via telemedicine include:  Delay or interruption in medical evaluation due to technological equipment failure or disruption; Information transmitted may not be sufficient (e.g. poor resolution  of images) to allow for appropriate medical decision making by the Practitioner; and/or  In rare instances, security protocols could fail, causing a breach of personal health information.  Furthermore, I acknowledge that it is my responsibility to provide information about my medical history, conditions and care that is complete and accurate to the best of my ability. I acknowledge that Practitioner's advice, recommendations, and/or decision may be based on factors not within their control, such as incomplete or inaccurate data provided by me or distortions of diagnostic images or specimens that may result from electronic transmissions. I understand that the practice of medicine is not an exact science and that Practitioner makes no warranties or guarantees regarding treatment outcomes. I acknowledge that a copy of this consent can be made available to me via my patient portal ([facility]), or I can request a printed copy by calling the office of [facility].    I understand that my insurance will be billed for this visit.   I have read or had this consent read to me. I understand the contents of this consent, which adequately explains the benefits and risks of the Services being provided via telemedicine.  I have been provided ample opportunity to ask questions regarding this consent and the Services and have had my questions answered to my satisfaction. I give my informed consent for the services to be provided through the use of telemedicine in my medical care

## 2022-05-25 ENCOUNTER — Other Ambulatory Visit: Payer: Self-pay

## 2022-05-25 ENCOUNTER — Emergency Department (HOSPITAL_BASED_OUTPATIENT_CLINIC_OR_DEPARTMENT_OTHER)
Admission: EM | Admit: 2022-05-25 | Discharge: 2022-05-25 | Disposition: A | Discharge status: Home / Self Care | Payer: BC Managed Care – PPO | Attending: Emergency Medicine | Admitting: Emergency Medicine

## 2022-05-25 ENCOUNTER — Emergency Department (HOSPITAL_BASED_OUTPATIENT_CLINIC_OR_DEPARTMENT_OTHER): Payer: BC Managed Care – PPO

## 2022-05-25 ENCOUNTER — Encounter (HOSPITAL_BASED_OUTPATIENT_CLINIC_OR_DEPARTMENT_OTHER): Payer: Self-pay | Admitting: Emergency Medicine

## 2022-05-25 DIAGNOSIS — R109 Unspecified abdominal pain: Secondary | ICD-10-CM | POA: Diagnosis not present

## 2022-05-25 DIAGNOSIS — K529 Noninfective gastroenteritis and colitis, unspecified: Secondary | ICD-10-CM | POA: Diagnosis not present

## 2022-05-25 DIAGNOSIS — R1013 Epigastric pain: Secondary | ICD-10-CM | POA: Diagnosis not present

## 2022-05-25 DIAGNOSIS — R112 Nausea with vomiting, unspecified: Secondary | ICD-10-CM

## 2022-05-25 DIAGNOSIS — Z91040 Latex allergy status: Secondary | ICD-10-CM | POA: Diagnosis not present

## 2022-05-25 LAB — COMPREHENSIVE METABOLIC PANEL
ALT: 15 U/L (ref 0–44)
AST: 14 U/L — ABNORMAL LOW (ref 15–41)
Albumin: 4.8 g/dL (ref 3.5–5.0)
Alkaline Phosphatase: 53 U/L (ref 38–126)
Anion gap: 11 (ref 5–15)
BUN: 10 mg/dL (ref 6–20)
CO2: 28 mmol/L (ref 22–32)
Calcium: 10.4 mg/dL — ABNORMAL HIGH (ref 8.9–10.3)
Chloride: 102 mmol/L (ref 98–111)
Creatinine, Ser: 0.79 mg/dL (ref 0.44–1.00)
GFR, Estimated: >60 mL/min (ref >60)
Glucose, Bld: 103 mg/dL — ABNORMAL HIGH (ref 70–99)
Potassium: 4.1 mmol/L (ref 3.5–5.1)
Sodium: 141 mmol/L (ref 135–145)
Total Bilirubin: 0.6 mg/dL (ref 0.3–1.2)
Total Protein: 7.9 g/dL (ref 6.5–8.1)

## 2022-05-25 LAB — CBC
HCT: 41.9 % (ref 36.0–46.0)
Hemoglobin: 14.8 g/dL (ref 12.0–15.0)
MCH: 29.1 pg (ref 26.0–34.0)
MCHC: 35.3 g/dL (ref 30.0–36.0)
MCV: 82.5 fL (ref 80.0–100.0)
Platelets: 280 10*3/uL (ref 150–400)
RBC: 5.08 MIL/uL (ref 3.87–5.11)
RDW: 11.7 % (ref 11.5–15.5)
WBC: 12.6 10*3/uL — ABNORMAL HIGH (ref 4.0–10.5)
nRBC: 0 % (ref 0.0–0.2)

## 2022-05-25 LAB — URINALYSIS, ROUTINE W REFLEX MICROSCOPIC
Bilirubin Urine: NEGATIVE
Glucose, UA: NEGATIVE mg/dL
Hgb urine dipstick: NEGATIVE
Ketones, ur: NEGATIVE mg/dL
Leukocytes,Ua: NEGATIVE
Nitrite: NEGATIVE
Protein, ur: NEGATIVE mg/dL
Specific Gravity, Urine: 1.013 (ref 1.005–1.030)
pH: 5.5 (ref 5.0–8.0)

## 2022-05-25 LAB — PREGNANCY, URINE: Preg Test, Ur: NEGATIVE

## 2022-05-25 LAB — LIPASE, BLOOD: Lipase: 38 U/L (ref 11–51)

## 2022-05-25 MED ORDER — IOHEXOL 300 MG/ML  SOLN
100.0000 mL | Freq: Once | INTRAMUSCULAR | Status: AC | PRN
  Administered 2022-05-25: 80 mL via INTRAVENOUS

## 2022-05-25 MED ORDER — SODIUM CHLORIDE 0.9 % IV BOLUS
1000.0000 mL | Freq: Once | INTRAVENOUS | Status: AC
  Administered 2022-05-25: 1000 mL via INTRAVENOUS

## 2022-05-25 MED ORDER — ONDANSETRON 4 MG PO TBDP: 4.0000 mg | ORAL_TABLET | Freq: Three times a day (TID) | ORAL | 0 refills | Status: DC | PRN

## 2022-05-25 MED ORDER — OXYCODONE HCL 5 MG PO TABS
5.0000 mg | ORAL_TABLET | Freq: Once | ORAL | Status: AC
  Administered 2022-05-25: 5 mg via ORAL
  Filled 2022-05-25: qty 1

## 2022-05-25 MED ORDER — ONDANSETRON HCL 4 MG/2ML IJ SOLN
4.0000 mg | Freq: Once | INTRAMUSCULAR | Status: AC
  Administered 2022-05-25: 4 mg via INTRAVENOUS
  Filled 2022-05-25: qty 2

## 2022-05-25 MED ORDER — OXYCODONE-ACETAMINOPHEN 5-325 MG PO TABS: 1.0000 | ORAL_TABLET | Freq: Four times a day (QID) | ORAL | 0 refills | Status: DC | PRN

## 2022-05-25 MED ORDER — HYDROMORPHONE HCL 1 MG/ML IJ SOLN
0.5000 mg | Freq: Once | INTRAMUSCULAR | Status: AC
  Administered 2022-05-25: 0.5 mg via INTRAVENOUS
  Filled 2022-05-25: qty 1

## 2022-05-25 MED ORDER — PANTOPRAZOLE SODIUM 40 MG IV SOLR
40.0000 mg | Freq: Once | INTRAVENOUS | Status: AC
  Administered 2022-05-25: 40 mg via INTRAVENOUS
  Filled 2022-05-25: qty 10

## 2022-05-25 NOTE — Discharge Instructions (Addendum)
Please read and follow all provided instructions.  Your diagnoses today include:  1. Enteritis   2. Epigastric pain   3. Nausea and vomiting, unspecified vomiting type     Tests performed today include: Blood cell counts and platelets Kidney and liver function tests Pancreas function test (called lipase) Urine test to look for infection A blood or urine test for pregnancy (women only) CT scan of the abdomen pelvis: Shows signs of enteritis and poor movement of contents through the small intestine, no signs of a mechanical blockage or severe infection Vital signs. See below for your results today.   Medications prescribed:  Percocet (oxycodone/acetaminophen) - narcotic pain medication  DO NOT drive or perform any activities that require you to be awake and alert because this medicine can make you drowsy. BE VERY CAREFUL not to take multiple medicines containing Tylenol (also called acetaminophen). Doing so can lead to an overdose which can damage your liver and cause liver failure and possibly death.  Zofran (ondansetron) - for nausea and vomiting  Take any prescribed medications only as directed.  Home care instructions:  Follow any educational materials contained in this packet.  Follow-up instructions: Please follow-up with your primary care provider in the next 3 days for further evaluation of your symptoms.    Return instructions:  SEEK IMMEDIATE MEDICAL ATTENTION IF: The pain does not go away or becomes severe  A temperature above 101F develops  Repeated vomiting occurs (multiple episodes)  The pain becomes localized to portions of the abdomen. The right side could possibly be appendicitis. In an adult, the left lower portion of the abdomen could be colitis or diverticulitis.  Blood is being passed in stools or vomit (bright red or black tarry stools)  You develop chest pain, difficulty breathing, dizziness or fainting, or become confused, poorly responsive, or  inconsolable (young children) If you have any other emergent concerns regarding your health  Additional Information: Abdominal (belly) pain can be caused by many things. Your caregiver performed an examination and possibly ordered blood/urine tests and imaging (CT scan, x-rays, ultrasound). Many cases can be observed and treated at home after initial evaluation in the emergency department. Even though you are being discharged home, abdominal pain can be unpredictable. Therefore, you need a repeated exam if your pain does not resolve, returns, or worsens. Most patients with abdominal pain don't have to be admitted to the hospital or have surgery, but serious problems like appendicitis and gallbladder attacks can start out as nonspecific pain. Many abdominal conditions cannot be diagnosed in one visit, so follow-up evaluations are very important.  Your vital signs today were: BP 122/81   Pulse 82   Temp 98.2 F (36.8 C) (Oral)   Resp 15   LMP 05/24/2022   SpO2 98%  If your blood pressure (bp) was elevated above 135/85 this visit, please have this repeated by your doctor within one month. --------------

## 2022-05-25 NOTE — ED Provider Notes (Signed)
[facility] Provider Note   CSN: [id number] Arrival date & time: 05/25/22  1606     History  Chief Complaint  Patient presents with   Abdominal Pain    Chelsey Sanford is a 47 y.o. female.  Patient with no history of abdominal surgeries presents to the emergency department today for evaluation of epigastric abdominal pain and vomiting.  Symptoms started around 2:30 PM today.  She describes the pain as being a gradually progressive contraction in the upper mid abdomen and then progresses to severe pain for approximately 5 to 10 seconds before relenting.  This then repeats after several minutes.  She has had vomiting prior to arrival.  She is never had pain like this in the past.  No chest pain or difficulty breathing except when the pain is most severe.  No cough or fever.  No urinary symptoms.  Constipation, diarrhea, blood in the stool preceding.  She denies heavy alcohol, NSAID use.       Home Medications Prior to Admission medications   Medication Sig Start Date End Date Taking? Authorizing Provider  cetirizine (ZYRTEC) 10 MG tablet Take 10 mg by mouth daily.    [provider]  clobetasol ointment (TEMOVATE) 0.05 %     [provider]  diphenhydrAMINE (BENADRYL) 25 MG tablet Take 50 mg by mouth every 6 (six) hours as needed for itching or allergies.    [provider]  docusate sodium (COLACE) 100 MG capsule Take 100 mg by mouth daily as needed for mild constipation.    [provider]  ibuprofen (ADVIL) 600 MG tablet     [provider]  Lactobacillus-Inulin (CULTURELLE DIGESTIVE HEALTH PO) Take 1 tablet by mouth daily.     [provider]  magnesium oxide (MAG-OX) 400 MG tablet Take 1 tablet (400 mg total) by mouth daily. 07/03/21   Fay Records, MD  pantoprazole (PROTONIX) 40 MG tablet Take 1 tablet (40 mg total) by mouth daily. 12/27/21   Binnie Rail, MD  pindolol (VISKEN) 5 MG  tablet Take 1 tablet (5 mg total) by mouth 2 (two) times daily. 07/03/21   Fay Records, MD  rizatriptan (MAXALT-MLT) 10 MG disintegrating tablet TAKE 1 TABLET BY MOUTH AS NEEDED FOR MIGRAINE, MAY REPEAT IN 2 HOURS IF NEEDED 12/27/21   Binnie Rail, MD      Allergies    Adhesive [tape], Sulfonamide derivatives, Doxycycline, Sulfa antibiotics, Amoxicillin, Erythromycin, Latex, and Penicillins    Review of Systems   Review of Systems  Physical Exam Updated Vital Signs BP (!) 141/85 (BP Location: Right Arm)   Pulse 83   Temp 97.8 F (36.6 C) (Oral)   Resp 20   LMP 05/24/2022   SpO2 100%  Physical Exam Vitals and nursing note reviewed.  Constitutional:      General: She is not in acute distress.    Appearance: She is well-developed.  HENT:     Head: Normocephalic and atraumatic.     Right Ear: External ear normal.     Left Ear: External ear normal.     Nose: Nose normal.  Eyes:     Conjunctiva/sclera: Conjunctivae normal.  Cardiovascular:     Rate and Rhythm: Normal rate and regular rhythm.     Heart sounds: No murmur heard. Pulmonary:     Effort: No respiratory distress.     Breath sounds: No wheezing, rhonchi or rales.  Abdominal:     Palpations:  Abdomen is soft.     Tenderness: There is abdominal tenderness (Moderate) in the epigastric area. There is no guarding or rebound. Negative signs include Murphy's sign and McBurney's sign.  Musculoskeletal:     Cervical back: Normal range of motion and neck supple.     Right lower leg: No edema.     Left lower leg: No edema.  Skin:    General: Skin is warm and dry.     Findings: No rash.  Neurological:     General: No focal deficit present.     Mental Status: She is alert. Mental status is at baseline.     Motor: No weakness.  Psychiatric:        Mood and Affect: Mood normal.     ED Results / Procedures / Treatments   Labs (all labs ordered are listed, but only abnormal results are displayed) Labs Reviewed   COMPREHENSIVE METABOLIC PANEL - Abnormal; Notable for the following components:      Result Value   Glucose, Bld 103 (*)    Calcium 10.4 (*)    AST 14 (*)    All other components within normal limits  CBC - Abnormal; Notable for the following components:   WBC 12.6 (*)    All other components within normal limits  LIPASE, BLOOD  URINALYSIS, ROUTINE W REFLEX MICROSCOPIC  PREGNANCY, URINE    EKG None  Radiology CT ABDOMEN PELVIS W CONTRAST  Result Date: 05/25/2022 CLINICAL DATA:  Severe epigastric pain. EXAM: CT ABDOMEN AND PELVIS WITH CONTRAST TECHNIQUE: Multidetector CT imaging of the abdomen and pelvis was performed using the standard protocol following bolus administration of intravenous contrast. RADIATION DOSE REDUCTION: This exam was performed according to the departmental dose-optimization program which includes automated exposure control, adjustment of the mA and/or kV according to patient size and/or use of iterative reconstruction technique. CONTRAST:  66m OMNIPAQUE IOHEXOL 300 MG/ML  SOLN COMPARISON:  Ultrasound abdomen March 2018 FINDINGS: Lower chest: Mild linear opacity lung bases likely scar or atelectasis. No pleural effusion. Breathing motion. Hepatobiliary: Segment 4 simple appearing hepatic cyst again identified with diameter approaching 8.6 by 6.3 cm and Hounsfield unit of 11. No enhancing liver mass. Patent portal vein. Gallbladder is nondilated. Pancreas: Preserved pancreatic parenchyma without enhancing mass or ductal dilatation. Spleen: Spleen is nonenlarged. Preserved enhancement. Small splenule. Adrenals/Urinary Tract: Adrenal glands are preserved. No enhancing renal mass or collecting system dilatation. The uterus has a normal course and caliber down to the bladder. Preserved contours of the urinary bladder. Stomach/Bowel: On this non oral contrast exam the large bowel is relatively decompressed with some mild colonic stool. Few diverticula. Normal retrocecal appendix  in the right hemipelvis. The stomach is nondilated. The duodenal bulb, C-loop and sweep are preserved. There are fluid-filled loops of small bowel seen in the mid abdomen extending into the left hemipelvis. Maximal diameter seen of loops in the right lower quadrant approaching 3.4 cm in diameter, mildly dilated. There is a moderately lung loop of ileum just distal to this with significant wall thickening, submucosal thickening particular. Just distal to this there is a slow transition to a more normal caliber small bowel. There is some nondilated loops of very proximal ileum as well. Central mesenteric stranding. Trace ascites. No pneumatosis. No free air. Vascular/Lymphatic: No significant vascular findings are present. No enlarged abdominal or pelvic lymph nodes. Few prominent mesenteric nodes are seen, not pathologic by size criteria. Reproductive: Uterus and bilateral adnexa are unremarkable. Other: No anterior pelvic wall hernia.  Musculoskeletal: Mild degenerative changes seen particularly at L5-S1 with some osteophyte formation and disc bulging. Associated stenosis. IMPRESSION: 1. Fluid-filled loops of small bowel seen in the mid abdomen extending into the left hemipelvis. Maximal diameter seen of loops in the right lower quadrant approaching 3.4 cm in diameter. There is a loop of ileum just distal to this with significant wall thickening, submucosal thickening particular. Distal to this there is a slow transition to a more normal caliber small bowel. Central mesenteric stranding. Findings are nonspecific but can be seen with infectious or inflammatory enteritis. 2. Trace ascites. No free air or pneumatosis. 3. Segment 4 simple appearing hepatic cyst again identified with diameter approaching 8.6 by 6.3 cm. No specific imaging follow-up of this lesion. 4. Mild degenerative changes at L5-S1 with some osteophyte formation and disc bulging. Associated stenosis. Electronically Signed   By: Jill Side M.D.   On:  05/25/2022 17:57    Procedures Procedures    Medications Ordered in ED Medications  HYDROmorphone (DILAUDID) injection 0.5 mg (0.5 mg Intravenous Given 05/25/22 1745)  ondansetron (ZOFRAN) injection 4 mg (4 mg Intravenous Given 05/25/22 1755)  sodium chloride 0.9 % bolus 1,000 mL (0 mLs Intravenous Stopped 05/25/22 2035)  pantoprazole (PROTONIX) injection 40 mg (40 mg Intravenous Given 05/25/22 1755)  iohexol (OMNIPAQUE) 300 MG/ML solution 100 mL (80 mLs Intravenous Contrast Given 05/25/22 1737)  oxyCODONE (Oxy IR/ROXICODONE) immediate release tablet 5 mg (5 mg Oral Given 05/25/22 1902)    ED Course/ Medical Decision Making/ A&P    Patient seen and examined. History obtained directly from patient. Work-up including labs, imaging, EKG ordered in triage, if performed, were reviewed.    Labs/EKG: Independently reviewed and interpreted.  This included: CBC with elevated white blood cell count of 12.6; CMP unremarkable; lipase normal; urine without signs of infection; urine pregnancy negative.  Imaging: Ordered CT abdomen and pelvis  Medications/Fluids: Ordered: Pain medication and nausea medication, IV fluid bolus, IV Protonix c  Most recent vital signs reviewed and are as follows: BP (!) 141/85 (BP Location: Right Arm)   Pulse 83   Temp 97.8 F (36.6 C) (Oral)   Resp 20   LMP 05/24/2022   SpO2 100%   Initial impression: Epigastric abdominal pain, vomiting  7:49 PM Reassessment performed. Patient appears stable.  Pain is currently 4 out of 10.  Things seem stable, but will monitor for a bit longer.  Patient has some anxiety about the pain coming back worse after leaving.  She has tolerated some sips of fluid.  This is reasonable and we will reevaluate.  BP 130/68 (BP Location: Right Arm)   Pulse 83   Temp 98.2 F (36.8 C) (Oral)   Resp 15   LMP 05/24/2022   SpO2 100%   9:35 PM Reassessment performed. Patient appears stable.  She is ready to be discharged.  Reviewed pertinent  lab work and imaging with patient at bedside. Questions answered.   Most current vital signs reviewed and are as follows: BP 122/81   Pulse 82   Temp 98.2 F (36.8 C) (Oral)   Resp 18   LMP 05/24/2022   SpO2 98%   Plan: Discharge to home.   Prescriptions written for: Percocet, Zofran  Other home care instructions discussed: Use pain and nausea medication on a schedule for the next 12 to 24 hours and then gradually decrease use, stick with clear liquids over the next 12 to 24 hours and then gradually advance diet.  ED return instructions discussed: The  patient was urged to return to the Emergency Department immediately with worsening of current symptoms, worsening abdominal pain, persistent vomiting, blood noted in stools, fever, or any other concerns. The patient verbalized understanding.   Follow-up instructions discussed: Patient encouraged to follow-up with their PCP in 3 days.    Click here for ABCD2, HEART and other calculatorsREFRESH Note before signing :1}                          Medical Decision Making Amount and/or Complexity of Data Reviewed Labs: ordered. Radiology: ordered.  Risk Prescription drug management.   For this patient's complaint of abdominal pain, the following conditions were considered on the differential diagnosis: gastritis/PUD, enteritis/duodenitis, appendicitis, cholelithiasis/cholecystitis, cholangitis, pancreatitis, ruptured viscus, colitis, diverticulitis, small/large bowel obstruction, proctitis, cystitis, pyelonephritis, ureteral colic, aortic dissection, aortic aneurysm. In women, ectopic pregnancy, pelvic inflammatory disease, ovarian cysts, and tubo-ovarian abscess were also considered. Atypical chest etiologies were also considered including ACS, PE, and pneumonia.  CT shows enteritis with likely ileus, no mechanical bowel obstruction.  No signs of perforation.  Patient's symptoms severe at arrival, controlled during ED stay.  Will give trial  of pain and nausea medication for home.  Patient will continue to hydrate and follow-up with her doctor next week if symptoms or not markedly improved.  The patient's vital signs, pertinent lab work and imaging were reviewed and interpreted as discussed in the ED course. Hospitalization was considered for further testing, treatments, or serial exams/observation. However as patient is well-appearing, has a stable exam, and reassuring studies today, I do not feel that they warrant admission at this time. This plan was discussed with the patient who verbalizes agreement and comfort with this plan and seems reliable and able to return to the Emergency Department with worsening or changing symptoms.           Final Clinical Impression(s) / ED Diagnoses Final diagnoses:  Enteritis  Epigastric pain  Nausea and vomiting, unspecified vomiting type    Rx / DC Orders ED Discharge Orders          Ordered    oxyCODONE-acetaminophen (PERCOCET/ROXICET) 5-325 MG tablet  Every 6 hours PRN        05/25/22 2108    ondansetron (ZOFRAN-ODT) 4 MG disintegrating tablet  Every 8 hours PRN        05/25/22 2108              Carlisle Cater, PA-C 05/25/22 2137    Blanchie Dessert, MD 05/26/22 0021

## 2022-05-25 NOTE — ED Triage Notes (Signed)
Abdominal pain upper, mid. Started around 2:30pm described pain as "feeling like contractions"

## 2022-05-28 ENCOUNTER — Encounter: Payer: Self-pay | Admitting: Internal Medicine

## 2022-05-29 ENCOUNTER — Encounter: Payer: Self-pay | Admitting: Internal Medicine

## 2022-05-29 NOTE — Progress Notes (Unsigned)
Subjective:    Patient ID: Chelsey Sanford, female    DOB: 1976-03-26, 47 y.o.   MRN: [id number]     HPI Chelsey Sanford is here for follow up from the hospital  ED 05/25/22 for abdominal pain.  Was having epigastric pain and vomiting.  Symptoms started that afternoon.  The was like contractions in the upper mid abdomen that would progress to severe pain for 5-10 seconds before improving.  This would repeat after several minutes.  No prior episodes.  UA neg, WBC slightly elevated.   Ct Ab/P showed findings suggestive of infectious or inflammatory enteritis.  Prescribed percocet and zofran for how.  She has been eating a bland diet and drinking plenty of fluids.    Should go away w/in one week- doxy  Medications and allergies reviewed with patient and updated if appropriate.  Current Outpatient Medications on File Prior to Visit  Medication Sig Dispense Refill   cetirizine (ZYRTEC) 10 MG tablet Take 10 mg by mouth daily.     clobetasol ointment (TEMOVATE) 0.05 %      diphenhydrAMINE (BENADRYL) 25 MG tablet Take 50 mg by mouth every 6 (six) hours as needed for itching or allergies.     docusate sodium (COLACE) 100 MG capsule Take 100 mg by mouth daily as needed for mild constipation.     ibuprofen (ADVIL) 600 MG tablet      Lactobacillus-Inulin (CULTURELLE DIGESTIVE HEALTH PO) Take 1 tablet by mouth daily.      magnesium oxide (MAG-OX) 400 MG tablet Take 1 tablet (400 mg total) by mouth daily. 90 tablet 3   ondansetron (ZOFRAN-ODT) 4 MG disintegrating tablet Take 1 tablet (4 mg total) by mouth every 8 (eight) hours as needed for nausea or vomiting. 10 tablet 0   oxyCODONE-acetaminophen (PERCOCET/ROXICET) 5-325 MG tablet Take 1 tablet by mouth every 6 (six) hours as needed for severe pain. 12 tablet 0   pantoprazole (PROTONIX) 40 MG tablet Take 1 tablet (40 mg total) by mouth daily. 90 tablet 3   pindolol (VISKEN) 5 MG tablet Take 1 tablet (5 mg total) by mouth 2 (two) times daily. 180 tablet 0    rizatriptan (MAXALT-MLT) 10 MG disintegrating tablet TAKE 1 TABLET BY MOUTH AS NEEDED FOR MIGRAINE, MAY REPEAT IN 2 HOURS IF NEEDED 9 tablet 11   No current facility-administered medications on file prior to visit.     Review of Systems     Objective:  There were no vitals filed for this visit. BP Readings from Last 3 Encounters:  05/25/22 122/81  03/06/22 120/64  12/27/21 122/82   Wt Readings from Last 3 Encounters:  03/06/22 183 lb 9.6 oz (83.3 kg)  07/03/21 184 lb 3.2 oz (83.6 kg)  04/13/21 175 lb (79.4 kg)   There is no height or weight on file to calculate BMI.    Physical Exam     Lab Results  Component Value Date   WBC 12.6 (H) 05/25/2022   HGB 14.8 05/25/2022   HCT 41.9 05/25/2022   PLT 280 05/25/2022   GLUCOSE 103 (H) 05/25/2022   CHOL 202 (H) 02/11/2020   TRIG 90 02/11/2020   HDL 58 02/11/2020   LDLCALC 128 (H) 02/11/2020   ALT 15 05/25/2022   AST 14 (L) 05/25/2022   NA 141 05/25/2022   K 4.1 05/25/2022   CL 102 05/25/2022   CREATININE 0.79 05/25/2022   BUN 10 05/25/2022   CO2 28 05/25/2022   TSH 2.53 03/06/2022  INR 0.9 09/14/2011     Assessment & Plan:    See Problem List for Assessment and Plan of chronic medical problems.

## 2022-05-30 ENCOUNTER — Ambulatory Visit: Payer: BC Managed Care – PPO | Admitting: Internal Medicine

## 2022-05-30 VITALS — BP 122/80 | HR 70 | Temp 98.0°F | Ht 66.0 in | Wt 186.0 lb

## 2022-05-30 DIAGNOSIS — K219 Gastro-esophageal reflux disease without esophagitis: Secondary | ICD-10-CM | POA: Diagnosis not present

## 2022-05-30 DIAGNOSIS — K529 Noninfective gastroenteritis and colitis, unspecified: Secondary | ICD-10-CM | POA: Diagnosis not present

## 2022-05-30 NOTE — Patient Instructions (Addendum)
        Medications changes include :   none    Continue increased fluids, bland diet and advance your diet slowly.  Slowly increase your fiber/vegetables as tolerated.   You can take stool softener if needed.  Continue the probiotics.     Return if symptoms worsen or fail to improve.

## 2022-05-30 NOTE — Assessment & Plan Note (Signed)
Acute Symptoms started 1/26-was seen in emergency room and diagnosis confirmed by CT abdomen pelvis She was prescribed Percocet and Zofran-has only taken one of the pain medications Pain has improved and today just has significant bloating and some discomfort but no pain Diarrhea hopefully has stopped-May almost feel little constipated today Since symptoms are improving this was likely a viral infection-no need for additional treatment Has been eating bland and drinking plenty of fluids-will continue Slowly advance diet once she is feeling a little bit better Okay to take a stool softener Continue probiotics She will call with any questions or concerns

## 2022-05-30 NOTE — Assessment & Plan Note (Signed)
Chronic GERD controlled Continue pantoprazole 40 mg daily 

## 2022-06-01 ENCOUNTER — Ambulatory Visit: Payer: BC Managed Care – PPO | Attending: Cardiovascular Disease | Admitting: Physician Assistant

## 2022-06-01 DIAGNOSIS — Z01810 Encounter for preprocedural cardiovascular examination: Secondary | ICD-10-CM

## 2022-06-01 NOTE — Telephone Encounter (Signed)
Please see today's virtual telephone visit for cardiac clearance.  I have forwarded my note to the surgeon's office.

## 2022-06-01 NOTE — Progress Notes (Signed)
Virtual Visit via Telephone Note   Because of Chelsey Sanford's co-morbid illnesses, she is at least at moderate risk for complications without adequate follow up.  This format is felt to be most appropriate for this patient at this time.  The patient did not have access to video technology/had technical difficulties with video requiring transitioning to audio format only (telephone).  All issues noted in this document were discussed and addressed.  No physical exam could be performed with this format.  Please refer to the patient's chart for her consent to telehealth for [facility].  Evaluation Performed:  Preoperative cardiovascular risk assessment _____________   Date:  06/01/2022   Patient ID:  Chelsey Sanford, DOB February 20, 1976, MRN [id number] Patient Location:  Home Provider location:   Office  Primary Care Provider:  Binnie Rail, MD Primary Cardiologist:  Dorris Carnes, MD  Chief Complaint / Patient Profile   47 y.o. y/o female with a h/o autonomic dysfunction, POTS, Hashimoto's s/p thyroid surgery who is pending elbow surgery by Dr. Victorino December and presents today for telephonic preoperative cardiovascular risk assessment.  History of Present Illness    Chelsey Sanford is a 47 y.o. female who presents via audio/video conferencing for a telehealth visit today.  Pt was last seen in cardiology clinic on 07/03/2021 by Dr. Dorris Carnes.  At that time Chelsey Sanford was doing well.  The patient is now pending procedure as outlined above. 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 usually triggered by dehydration   Seasonal allergies    Sickle cell trait ([place])    Thyroid disease    not on meds at this time (03/28/2021)- post parathyroidectomy   Past Surgical History:  Procedure Laterality Date   ANKLE RECONSTRUCTION Right 1993   BREAST BIOPSY Right 01/12/2015   fibroadenoma   CERVICAL FUSION  2021   COLONOSCOPY  2010   external provider-F/V-normal   ELBOW SURGERY Right 2015   SINUS EXPLORATION  2015   THYROID LOBECTOMY Right 06/08/2019   Procedure: RIGHT THYROID LOBECTOMY;  Surgeon: Armandina Gemma, MD;  Location: [facility];  Service: General;  Laterality: Right;   [place]    Allergies  Allergies  Allergen Reactions   Adhesive [Tape] Rash and Other (See Comments)    Skin blistered and disintegrated after epidural (looked like 3rd degree burn) - possibly due to tape and/or tegaderm - do not use anything with adhesive   Sulfonamide Derivatives Nausea And Vomiting   Doxycycline Nausea And Vomiting   Sulfa Antibiotics Nausea And Vomiting  Amoxicillin Nausea And Vomiting and Rash   Erythromycin Nausea And Vomiting and Rash   Latex Rash    See notes under adhesive tape   Penicillins Nausea And Vomiting and Rash    Has patient had a PCN reaction causing immediate rash, facial/tongue/throat swelling, SOB or lightheadedness with hypotension: Yes Has patient had a PCN reaction causing severe rash involving mucus membranes or skin necrosis: No Has patient had a PCN reaction that required hospitalization: No Has patient had a PCN reaction occurring within the last 10 years: No If all of the above  answers are "NO", then may proceed with Cephalosporin use.    Home Medications    Prior to Admission medications   Medication Sig Start Date End Date Taking? Authorizing Provider  cetirizine (ZYRTEC) 10 MG tablet Take 10 mg by mouth daily.    [provider]  clobetasol ointment (TEMOVATE) 0.05 %     [provider]  diphenhydrAMINE (BENADRYL) 25 MG tablet Take 50 mg by mouth every 6 (six) hours as needed for itching or allergies.    [provider]  docusate sodium (COLACE) 100 MG capsule Take 100 mg by mouth daily as needed for mild constipation.    [provider]  ibuprofen (ADVIL) 600 MG tablet     [provider]  Lactobacillus-Inulin (CULTURELLE DIGESTIVE HEALTH PO) Take 1 tablet by mouth daily.     [provider]  magnesium oxide (MAG-OX) 400 MG tablet Take 1 tablet (400 mg total) by mouth daily. 07/03/21   Fay Records, MD  pantoprazole (PROTONIX) 40 MG tablet Take 1 tablet (40 mg total) by mouth daily. 12/27/21   Binnie Rail, MD  pindolol (VISKEN) 5 MG tablet Take 1 tablet (5 mg total) by mouth 2 (two) times daily. 07/03/21   Fay Records, MD  rizatriptan (MAXALT-MLT) 10 MG disintegrating tablet TAKE 1 TABLET BY MOUTH AS NEEDED FOR MIGRAINE, MAY REPEAT IN 2 HOURS IF NEEDED 12/27/21   Binnie Rail, MD    Physical Exam    Vital Signs:  Chelsey Sanford does not have vital signs available for review today.  Given telephonic nature of communication, physical exam is limited. AAOx3. NAD. Normal affect.  Speech and respirations are unlabored.  Accessory Clinical Findings    None  Assessment & Plan    1.  Preoperative Cardiovascular Risk Assessment:  -Patient is a pleasant 47 year old female with past medical history of autonomic dysfunction and POTS, she has not had any significant dizziness.  Her POTS disorder is very well-controlled.  She has upcoming elbow surgery.  She denies any recent chest pain or worsening dyspnea.  She  has no problem running a mile on the treadmill or walks 3 miles on a daily basis.  She is clearly able to accomplish at least 4 METS of activity.  She is a low risk candidate for upcoming procedure.   The patient was advised that if she develops new symptoms prior to surgery to contact our office to arrange for a follow-up visit, and she verbalized understanding.   A copy of this note will be routed to requesting surgeon.  Time:   Today, I have spent 5 minutes with the patient with telehealth technology discussing medical history, symptoms, and management plan.     [place], [place]  06/01/2022, 9:22 AM

## 2022-06-11 DIAGNOSIS — M7712 Lateral epicondylitis, left elbow: Secondary | ICD-10-CM | POA: Diagnosis not present

## 2022-07-02 ENCOUNTER — Encounter: Payer: Self-pay | Admitting: Internal Medicine

## 2022-07-02 ENCOUNTER — Other Ambulatory Visit: Payer: Self-pay | Admitting: Internal Medicine

## 2022-07-02 DIAGNOSIS — G90A Postural orthostatic tachycardia syndrome (POTS): Secondary | ICD-10-CM

## 2022-07-05 DIAGNOSIS — M25622 Stiffness of left elbow, not elsewhere classified: Secondary | ICD-10-CM | POA: Diagnosis not present

## 2022-07-19 DIAGNOSIS — M25622 Stiffness of left elbow, not elsewhere classified: Secondary | ICD-10-CM | POA: Diagnosis not present

## 2022-07-19 DIAGNOSIS — M25522 Pain in left elbow: Secondary | ICD-10-CM | POA: Diagnosis not present

## 2022-08-17 DIAGNOSIS — M25622 Stiffness of left elbow, not elsewhere classified: Secondary | ICD-10-CM | POA: Diagnosis not present

## 2022-08-22 ENCOUNTER — Telehealth: Payer: Self-pay | Admitting: Internal Medicine

## 2022-08-22 DIAGNOSIS — G90A Postural orthostatic tachycardia syndrome (POTS): Secondary | ICD-10-CM

## 2022-08-22 MED ORDER — PINDOLOL 5 MG PO TABS: ORAL_TABLET | ORAL | 1 refills | Status: DC

## 2022-08-22 NOTE — Telephone Encounter (Signed)
Refill for Pindolol has been sent to [facility].  Pt has an appointment with Dr. Tenny Craw in September.

## 2022-08-22 NOTE — Telephone Encounter (Signed)
*  STAT* If patient is at the pharmacy, call can be transferred to refill team.   1. Which medications need to be refilled? (please list name of each medication and dose if known)   pindolol (VISKEN) 5 MG tablet    2. Which pharmacy/location (including street and city if local pharmacy) is medication to be sent to? [id number] - [place], [place] - [address]   3. Do they need a 30 day or 90 day supply? 90 day   Pt has scheduled appt on 01/02/23

## 2022-08-23 DIAGNOSIS — M25622 Stiffness of left elbow, not elsewhere classified: Secondary | ICD-10-CM | POA: Diagnosis not present

## 2022-08-23 DIAGNOSIS — M25522 Pain in left elbow: Secondary | ICD-10-CM | POA: Diagnosis not present

## 2022-08-30 DIAGNOSIS — M25522 Pain in left elbow: Secondary | ICD-10-CM | POA: Diagnosis not present

## 2022-09-12 DIAGNOSIS — M25522 Pain in left elbow: Secondary | ICD-10-CM | POA: Diagnosis not present

## 2023-01-01 NOTE — Progress Notes (Signed)
Cardiology Office Note   Date:  01/02/2023   ID:  Chelsey Sanford, DOB [birth year], MRN [id number]  PCP:  Pincus Sanes, MD  Cardiologist:   Dietrich Pates, MD   F/U of autonomic dysfunciton      History of Present Illness: Chelsey Sanford is a 47 y.o. female with a history of autonomic dysfunction    Pt also had hx Hashimoto's   s/p thyroid surgery.     I saw the pt in March 2023   SInce seen she says she is doing well   Has occasional dizziness, fatgue   No syncope SHe is very active    Yoga, jogging, work     Diet:    Eats 11 am to 7 PM 11  Eggs, avocado Lunch  COttage chesse   Liquid IV  Electrolytes with 100 oz fluid Dinner:  Chicken and orzo  Current Meds  Medication Sig   cetirizine (ZYRTEC) 10 MG tablet Take 10 mg by mouth daily.   clobetasol ointment (TEMOVATE) 0.05 %    diphenhydrAMINE (BENADRYL) 25 MG tablet Take 50 mg by mouth every 6 (six) hours as needed for itching or allergies.   docusate sodium (COLACE) 100 MG capsule Take 100 mg by mouth daily as needed for mild constipation.   ibuprofen (ADVIL) 600 MG tablet    Lactobacillus-Inulin (CULTURELLE DIGESTIVE HEALTH PO) Take 1 tablet by mouth daily.    magnesium oxide (MAG-OX) 400 MG tablet Take 1 tablet (400 mg total) by mouth daily.   pantoprazole (PROTONIX) 40 MG tablet Take 1 tablet (40 mg total) by mouth daily.   pindolol (VISKEN) 5 MG tablet TAKE 1 TABLET(5 MG) BY MOUTH TWICE DAILY   rizatriptan (MAXALT-MLT) 10 MG disintegrating tablet TAKE 1 TABLET BY MOUTH AS NEEDED FOR MIGRAINE, MAY REPEAT IN 2 HOURS IF NEEDED     Allergies:   Adhesive [tape], Sulfonamide derivatives, Doxycycline, Sulfa antibiotics, Amoxicillin, Erythromycin, Latex, and Penicillins   Past Medical History:  Diagnosis Date   ALLERGIC RHINITIS    ANEMIA-NOS    hx of   Anxiety    Complication of anesthesia    Delayed gastric emptying    dx at [facility] by gastric emptying study per patient   DVT of axillary vein, acute (HCC)    DYSAUTONOMIA     ENDOMETRIOSIS    GERD (gastroesophageal reflux disease) 10/06/2011   with certain foods due to esophageal paralysis   Headache(784.0)    Irritable bowel syndrome    Lumbar disc disease    Migraine    since 6th grade   PONV (postoperative nausea and vomiting)    scope patch works well   POTS (postural orthostatic tachycardia syndrome)    not triggered by anesthesia. usually triggered by dehydration   Seasonal allergies    Sickle cell trait (HCC)    Thyroid disease    not on meds at this time (03/28/2021)- post parathyroidectomy    Past Surgical History:  Procedure Laterality Date   ANKLE RECONSTRUCTION Right 1993   BREAST BIOPSY Right 01/12/2015   fibroadenoma   CERVICAL FUSION  2021   COLONOSCOPY  2010   external provider-F/V-normal   ELBOW SURGERY Right 2015   SINUS EXPLORATION  2015   THYROID LOBECTOMY Right 06/08/2019   Procedure: RIGHT THYROID LOBECTOMY;  Surgeon: Darnell Level, MD;  Location: [facility];  Service: General;  Laterality: Right;   TONSILLECTOMY  1996   WISDOM TOOTH EXTRACTION  1992     Social  History:  The patient  reports that she quit smoking about 26 years ago. Her smoking use included cigarettes. She has never used smokeless tobacco. She reports current alcohol use of about 14.0 - 21.0 standard drinks of alcohol per week. She reports that she does not use drugs.   Family History:  The patient's family history includes Arthritis/Rheumatoid in her paternal grandmother; Breast cancer in her cousin; Cancer in her paternal grandfather; Colon polyps (age of onset: 65) in her mother; Crohn's disease in her maternal grandmother; Migraines in her mother; Parkinson's disease in her paternal grandfather and paternal grandmother; Skin cancer in her mother; Thyroid disease in her mother.    ROS:  Please see the history of present illness. All other systems are reviewed and  Negative to the above problem except as noted.    PHYSICAL EXAM: VS:  BP  124/74   Pulse 73   Ht 5\' 6"  (1.676 m)   Wt 179 lb (81.2 kg)   SpO2 99%   BMI 28.89 kg/m    GEN: Well nourished, well developed, in no acute distress  HEENT: normal  Neck: no JVD  No bruit Cardiac: RRR; no murmur  No LE edema  Respiratory:  clear to auscultation bilaterally GI: soft, nontender, nondistended,    MS: no deformity Moving all extremities     EKG:  EKG is ordered today.  SR 77 bpm   Lipid Panel    Component Value Date/Time   CHOL 202 (H) 02/11/2020 0936   TRIG 90 02/11/2020 0936   HDL 58 02/11/2020 0936   CHOLHDL 3.5 02/11/2020 0936   CHOLHDL 4 05/27/2018 1029   VLDL 24.2 05/27/2018 1029   LDLCALC 128 (H) 02/11/2020 0936      Wt Readings from Last 3 Encounters:  01/02/23 179 lb (81.2 kg)  05/30/22 186 lb (84.4 kg)  03/06/22 183 lb 9.6 oz (83.3 kg)      ASSESSMENT AND PLAN:  1  Autonomic dysfunction  Pt is doing very well on current regimen   Stay active   Keep on pindolol    Stay hyudrated      2  Hx thyroid dysfunciton      Off of replacment Rx   Follows with [name]    3  HL   Mild increase LDL  Will check lipomed, apoB and A1c    Plan for f/u in 1 year     Current medicines are reviewed at length with the patient today.  The patient does not have concerns regarding medicine  Signed, Dietrich Pates, MD  01/02/2023 2:05 PM    [facility] [address] [place], [place], [place]  [address] Phone: [phone]; Fax: [phone]

## 2023-01-02 ENCOUNTER — Encounter: Payer: Self-pay | Admitting: Internal Medicine

## 2023-01-02 ENCOUNTER — Ambulatory Visit: Payer: BC Managed Care – PPO | Attending: Internal Medicine | Admitting: Internal Medicine

## 2023-01-02 VITALS — BP 124/74 | HR 73 | Ht 66.0 in | Wt 179.0 lb

## 2023-01-02 DIAGNOSIS — E0590 Thyrotoxicosis, unspecified without thyrotoxic crisis or storm: Secondary | ICD-10-CM | POA: Diagnosis not present

## 2023-01-02 DIAGNOSIS — Z79899 Other long term (current) drug therapy: Secondary | ICD-10-CM | POA: Diagnosis not present

## 2023-01-02 DIAGNOSIS — G90A Postural orthostatic tachycardia syndrome (POTS): Secondary | ICD-10-CM

## 2023-01-02 DIAGNOSIS — E559 Vitamin D deficiency, unspecified: Secondary | ICD-10-CM | POA: Diagnosis not present

## 2023-01-02 NOTE — Patient Instructions (Signed)
Medication Instructions:   *If you need a refill on your cardiac medications before your next appointment, please call your pharmacy*   Lab Work: VIT D, HGBA1C, NMR, APO B  If you have labs (blood work) drawn today and your tests are completely normal, you will receive your results only by: MyChart Message (if you have MyChart) OR A paper copy in the mail If you have any lab test that is abnormal or we need to change your treatment, we will call you to review the results.   Testing/Procedures:    Follow-Up: At [facility], you and your health needs are our priority.  As part of our continuing mission to provide you with exceptional heart care, we have created designated Provider Care Teams.  These Care Teams include your primary Cardiologist (physician) and Advanced Practice Providers (APPs -  Physician Assistants and Nurse Practitioners) who all work together to provide you with the care you need, when you need it.  We recommend signing up for the patient portal called "MyChart".  Sign up information is provided on this After Visit Summary.  MyChart is used to connect with patients for Virtual Visits (Telemedicine).  Patients are able to view lab/test results, encounter notes, upcoming appointments, etc.  Non-urgent messages can be sent to your provider as well.   To learn more about what you can do with MyChart, go to [email or web address].

## 2023-01-03 ENCOUNTER — Ambulatory Visit: Payer: BC Managed Care – PPO | Attending: Internal Medicine

## 2023-01-03 DIAGNOSIS — G90A Postural orthostatic tachycardia syndrome (POTS): Secondary | ICD-10-CM | POA: Diagnosis not present

## 2023-01-03 DIAGNOSIS — E559 Vitamin D deficiency, unspecified: Secondary | ICD-10-CM

## 2023-01-03 DIAGNOSIS — Z79899 Other long term (current) drug therapy: Secondary | ICD-10-CM | POA: Diagnosis not present

## 2023-01-03 DIAGNOSIS — E0590 Thyrotoxicosis, unspecified without thyrotoxic crisis or storm: Secondary | ICD-10-CM

## 2023-01-05 LAB — NMR, LIPOPROFILE
Cholesterol, Total: 223 mg/dL — ABNORMAL HIGH (ref 100–199)
HDL Particle Number: 39.8 umol/L (ref >=30.5)
HDL-C: 56 mg/dL (ref >39)
LDL Particle Number: 1650 nmol/L — ABNORMAL HIGH (ref <1000)
LDL Size: 20.9 nm (ref >20.5)
LDL-C (NIH Calc): 146 mg/dL — ABNORMAL HIGH (ref 0–99)
LP-IR Score: 62 — ABNORMAL HIGH (ref <=45)
Small LDL Particle Number: 526 nmol/L (ref <=527)
Triglycerides: 118 mg/dL (ref 0–149)

## 2023-01-05 LAB — HEMOGLOBIN A1C
Est. average glucose Bld gHb Est-mCnc: 105 mg/dL
Hgb A1c MFr Bld: 5.3 % (ref 4.8–5.6)

## 2023-01-05 LAB — APOLIPOPROTEIN B: Apolipoprotein B: 106 mg/dL — ABNORMAL HIGH (ref <90)

## 2023-01-05 LAB — VITAMIN D 25 HYDROXY (VIT D DEFICIENCY, FRACTURES): Vit D, 25-Hydroxy: 39.2 ng/mL (ref 30.0–100.0)

## 2023-01-14 ENCOUNTER — Other Ambulatory Visit: Payer: Self-pay | Admitting: Internal Medicine

## 2023-01-14 DIAGNOSIS — L710 Perioral dermatitis: Secondary | ICD-10-CM | POA: Diagnosis not present

## 2023-02-14 DIAGNOSIS — Z01411 Encounter for gynecological examination (general) (routine) with abnormal findings: Secondary | ICD-10-CM | POA: Diagnosis not present

## 2023-02-14 DIAGNOSIS — N951 Menopausal and female climacteric states: Secondary | ICD-10-CM | POA: Diagnosis not present

## 2023-02-14 DIAGNOSIS — Z130 Encounter for screening for diseases of the blood and blood-forming organs and certain disorders involving the immune mechanism: Secondary | ICD-10-CM | POA: Diagnosis not present

## 2023-03-07 ENCOUNTER — Encounter: Payer: Self-pay | Admitting: Internal Medicine

## 2023-03-07 ENCOUNTER — Ambulatory Visit: Payer: BC Managed Care – PPO | Admitting: Internal Medicine

## 2023-03-07 VITALS — BP 120/82 | HR 80 | Ht 66.0 in | Wt 182.0 lb

## 2023-03-07 DIAGNOSIS — E890 Postprocedural hypothyroidism: Secondary | ICD-10-CM

## 2023-03-07 DIAGNOSIS — E041 Nontoxic single thyroid nodule: Secondary | ICD-10-CM

## 2023-03-07 DIAGNOSIS — L821 Other seborrheic keratosis: Secondary | ICD-10-CM | POA: Diagnosis not present

## 2023-03-07 DIAGNOSIS — L578 Other skin changes due to chronic exposure to nonionizing radiation: Secondary | ICD-10-CM | POA: Diagnosis not present

## 2023-03-07 DIAGNOSIS — E0500 Thyrotoxicosis with diffuse goiter without thyrotoxic crisis or storm: Secondary | ICD-10-CM | POA: Diagnosis not present

## 2023-03-07 DIAGNOSIS — L710 Perioral dermatitis: Secondary | ICD-10-CM | POA: Diagnosis not present

## 2023-03-07 DIAGNOSIS — D225 Melanocytic nevi of trunk: Secondary | ICD-10-CM | POA: Diagnosis not present

## 2023-03-07 LAB — T3, FREE: T3, Free: 3.4 pg/mL (ref 2.3–4.2)

## 2023-03-07 LAB — TSH: TSH: 3.84 u[IU]/mL (ref 0.35–5.50)

## 2023-03-07 LAB — T4, FREE: Free T4: 0.69 ng/dL (ref 0.60–1.60)

## 2023-03-07 NOTE — Patient Instructions (Signed)
Please stop at the lab.  Please come back for a follow-up appointment in 1 year.  

## 2023-03-07 NOTE — Progress Notes (Signed)
Patient ID: Chelsey Sanford, female   DOB: [birth year], 47 y.o.   MRN: [id number]   HPI  Chelsey Sanford is a 47 y.o.-year-old female, returning for follow-up for subclinical thyrotoxicosis (likely Graves' disease).  Last visit 1 year ago.  Interim history: She continues to exercise consistently. Recently found to be menopausal. She has aches and pains, also fatigue (she sleeps very poorly), and also mentions weight gain (1 pound per our scale).  Reviewed history: Patient was found to have a low TSH on her annual physical exam in 11/2016. She was getting annual TFTs until then due to a family history of hypothyroidism in her mother.  The rest of her TSH levels have been normal since 2008.  Her thyroid tests initially improved in 02/2017, but the latest set of tests worsened again (subclinical thyrotoxicosis).  We started 5 mg of methimazole daily in 05/2017.  She started to feel better afterwards but she was lost for follow-up for 1 year and 7 months afterwards.  She then had R thyroidectomy in 06/2019 for a thyroid nodule with inconclusive Bx. Final pathology was benign.  She was able to stop methimazole after her thyroid surgery.  We did not have to start levothyroxine afterwards.  Reviewed her TFTs: Lab Results  Component Value Date   TSH 2.53 03/06/2022   TSH 3.27 03/02/2021   TSH 3.02 08/31/2020   TSH 3.940 02/11/2020   TSH 2.77 09/17/2019   TSH 3.16 08/05/2019   TSH 2.17 05/06/2019   TSH 2.58 02/27/2019   TSH 3.02 12/24/2017   TSH 0.18 (L) 07/24/2017   FREET4 0.87 03/06/2022   FREET4 0.73 03/02/2021   FREET4 0.82 08/31/2020   FREET4 1.18 02/11/2020   FREET4 0.78 09/17/2019   FREET4 0.68 08/05/2019   FREET4 1.13 05/06/2019   FREET4 0.87 02/27/2019   FREET4 0.72 12/24/2017   FREET4 0.86 07/24/2017   T3FREE 3.5 03/06/2022   T3FREE 3.3 03/02/2021   T3FREE 3.5 08/31/2020   T3FREE 3.3 02/11/2020   T3FREE 3.1 09/17/2019   T3FREE 3.1 08/05/2019   T3FREE 3.5 05/06/2019    T3FREE 3.3 02/27/2019   T3FREE 3.5 12/24/2017   T3FREE 3.2 07/24/2017   Her TPO antibodies were elevated: Component     Latest Ref Rng & Units 12/26/2016  Thyroperoxidase Ab SerPl-aCnc     <9 IU/mL 129 (H)  Thyroglobulin Ab     <2 IU/mL 1   Her Graves' antibodies are elevated: Lab Results  Component Value Date   TSI 441 (H) 03/07/2017   Reviewed the rest of her studies/procedures: We obtained a thyroid uptake and scan (04/03/2017): Uniform scan with the exception of a small right cold nodule, uptake at the upper limit of normal, 29%.  This was consistent with possible mild Graves' disease.  We then checked a thyroid ultrasound (04/15/2017): Small, 1.2 cm isoechoic solid nodule in the right lobe.  Also, small, subcentimeter echogenic left nodules.  None of the nodules meet criteria for biopsy or follow-up.  We repeated a thyroid ultrasound (04/01/2019): Her dominant right thyroid nodule has increased (1.5 cm X 1.5 x 0.7 cm) in size and risk.  Biopsy was recommended.  FNA (04/09/2019): Inconclusive: Atypia of undetermined significance (Bethesda category III); Afirma molecular marker: Suspicious (50% cancer risk)  I referred her to surgery and she had Right thyroid lobectomy (06/08/2019): Benign nodule, lymphocytic thyroiditis.  Pt denies: - hoarseness - dysphagia - choking - SOB with lying down She has food/pills stuck in throat at times.  Pt  does have a FH of thyroid ds. In Mother >> hypothyroidism. No FH of thyroid cancer. No h/o radiation tx to head or neck. No herbal supplements. No Biotin use. No recent steroids use.   Pt. also has a history of POTS dx 2008 - managed by cardiology.  On Pindolol. She also has B12 and vitamin D deficiency. She has longstanding anemia. Also, eosinophilic esophagitis and hiatal hernia >> on Protonix.  ROS: + See HPI  I reviewed pt's medications, allergies, PMH, social hx, family hx, and changes were documented in the history of present illness.  Otherwise, unchanged from my initial visit note.  Past Medical History:  Diagnosis Date   ALLERGIC RHINITIS    ANEMIA-NOS    hx of   Anxiety    Complication of anesthesia    Delayed gastric emptying    dx at [facility] by gastric emptying study per patient   DVT of axillary vein, acute (HCC)    DYSAUTONOMIA    ENDOMETRIOSIS    GERD (gastroesophageal reflux disease) 10/06/2011   with certain foods due to esophageal paralysis   Headache(784.0)    Irritable bowel syndrome    Lumbar disc disease    Migraine    since 6th grade   PONV (postoperative nausea and vomiting)    scope patch works well   POTS (postural orthostatic tachycardia syndrome)    not triggered by anesthesia. usually triggered by dehydration   Seasonal allergies    Sickle cell trait (HCC)    Thyroid disease    not on meds at this time (03/28/2021)- post parathyroidectomy   Past Surgical History:  Procedure Laterality Date   ANKLE RECONSTRUCTION Right 1993   BREAST BIOPSY Right 01/12/2015   fibroadenoma   CERVICAL FUSION  2021   COLONOSCOPY  2010   external provider-F/V-normal   ELBOW SURGERY Right 2015   SINUS EXPLORATION  2015   THYROID LOBECTOMY Right 06/08/2019   Procedure: RIGHT THYROID LOBECTOMY;  Surgeon: Darnell Level, MD;  Location: [facility];  Service: General;  Laterality: Right;   TONSILLECTOMY  1996   WISDOM TOOTH EXTRACTION  1992   Social History   Socioeconomic History   Marital status: Married    Spouse name: Jeannett Senior   Number of children: 1   Years of education: 16   Highest education level: Not on file  Occupational History   Occupation: Leisure centre manager: Self Employed    Comment: Civil engineer, contracting and Tennis  Tobacco Use   Smoking status: Former Smoker    Last attempt to quit: 04/30/1996    Years since quitting: 20.8   Smokeless tobacco: Never Used  Substance and Sexual Activity   Alcohol use: Yes    Comment: 1 time weekly - 2 drinks of winr   Drug  use: No   Sexual activity: Yes    Birth control/protection: Pill  Other Topics Concern   Not on file  Social History Narrative   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     clobetasol ointment (TEMOVATE) 0.05 %      diphenhydrAMINE (BENADRYL) 25 MG tablet Take 50 mg by  mouth every 6 (six) hours as needed for itching or allergies.     docusate sodium (COLACE) 100 MG capsule Take 100 mg by mouth daily as needed for mild constipation.     ibuprofen (ADVIL) 600 MG tablet      Lactobacillus-Inulin (CULTURELLE DIGESTIVE HEALTH PO) Take 1 tablet by mouth daily.      magnesium oxide (MAG-OX) 400 MG tablet Take 1 tablet (400 mg total) by mouth daily. 90 tablet 3   pantoprazole (PROTONIX) 40 MG tablet TAKE 1 TABLET(40 MG) BY MOUTH DAILY 90 tablet 3   pindolol (VISKEN) 5 MG tablet TAKE 1 TABLET(5 MG) BY MOUTH TWICE DAILY 180 tablet 1   rizatriptan (MAXALT-MLT) 10 MG disintegrating tablet TAKE 1 TABLET BY MOUTH AS NEEDED FOR MIGRAINE, MAY REPEAT IN 2 HOURS IF NEEDED 9 tablet 11   No current facility-administered medications on file prior to visit.   Allergies  Allergen Reactions   Adhesive [Tape] Rash and Other (See Comments)    Skin blistered and disintegrated after epidural (looked like 3rd degree burn) - possibly due to tape and/or tegaderm - do not use anything with adhesive   Sulfonamide Derivatives Nausea And Vomiting   Doxycycline Nausea And Vomiting   Sulfa Antibiotics Nausea And Vomiting   Amoxicillin Nausea And Vomiting and Rash   Erythromycin Nausea And Vomiting and Rash   Latex Rash    See notes under adhesive tape   Penicillins Nausea And Vomiting and Rash    Has  patient had a PCN reaction causing immediate rash, facial/tongue/throat swelling, SOB or lightheadedness with hypotension: Yes Has patient had a PCN reaction causing severe rash involving mucus membranes or skin necrosis: No Has patient had a PCN reaction that required hospitalization: No Has patient had a PCN reaction occurring within the last 10 years: No If all of the above answers are "NO", then may proceed with Cephalosporin use.   Family History  Problem Relation Age of Onset   Thyroid disease Mother        hypothyroidism   Migraines Mother    Colon polyps Mother 86   Skin cancer Mother        malignant melanoma   Crohn's disease Maternal Grandmother    Parkinson's disease Paternal Grandmother    Arthritis/Rheumatoid Paternal Grandmother    Cancer Paternal Grandfather        skin   Parkinson's disease Paternal Grandfather    Breast cancer Cousin    COPD Neg Hx    Colon cancer Neg Hx    Esophageal cancer Neg Hx    Rectal cancer Neg Hx    Stomach cancer Neg Hx    PE: BP 120/82   Pulse 80   Ht 5\' 6"  (1.676 m)   Wt 182 lb (82.6 kg)   SpO2 99%   BMI 29.38 kg/m  Wt Readings from Last 10 Encounters:  03/07/23 182 lb (82.6 kg)  01/02/23 179 lb (81.2 kg)  05/30/22 186 lb (84.4 kg)  03/06/22 183 lb 9.6 oz (83.3 kg)  07/03/21 184 lb 3.2 oz (83.6 kg)  04/13/21 175 lb (79.4 kg)  03/28/21 175 lb (79.4 kg)  03/02/21 179 lb 6.4 oz (81.4 kg)  08/01/20 178 lb (80.7 kg)  03/02/20 179 lb (81.2 kg)   Constitutional: overweight, in NAD Eyes: EOMI, no exophthalmos ENT: no neck masses palpated, thyroidectomy scar healed, no cervical lymphadenopathy Cardiovascular: RRR, No MRG Respiratory: CTA B Musculoskeletal: no deformities Skin:no rashes Neurological: + Very faint tremor with outstretched hands  ASSESSMENT: 1.  Graves' disease - Thyroid uptake and scan (04/03/2017): Upper normal 24 hour radio iodine uptake of 29%. Tiny cold nodule at inferior pole of RIGHT thyroid lobe  without dominant thyroid mass.  2.  Small thyroid nodule - now s/p right hemithyroidectomy - Thyroid ultrasound (04/15/2017): Parenchymal Echotexture: Moderately heterogenous Isthmus: 0.3 cm Right lobe: 4.2 x 1.4 x 1.7 cm Left lobe: 4.2 x 1.4 x 1.6 cm _________________________________________________________ Nodule # 1: Location: Right; Inferior Maximum size: 1.2 cm; Other 2 dimensions: 0.8 x 0.8 cm Composition: solid/almost completely solid (2) Echogenicity: isoechoic (1) Given size (<1.4 cm) and appearance, this nodule does NOT meet TI-RADS criteria for biopsy or dedicated follow-up. __________________________________________  An additional small, subcentimeter echogenic nodules present in the left mid gland. This nodule does not meet criteria for further evaluation.  IMPRESSION: Bilateral thyroid nodules which do not meet criteria for further evaluation. No further follow-up is required.  Thyroid U/S (04/01/2019): Parenchymal Echotexture: Mildly heterogenous Isthmus: Normal in size measures 0.5 cm in diameter, unchanged Right lobe: Normal in size measuring 4.3 x 1.3 x 1.8 cm, unchanged, previously, 4.2 x 1.4 x 1.7 cm Left lobe: Normal in size measuring 4.4 x 1.6 x 1.7 cm, unchanged, previously, 4.2 x 1.4 x 1.6 cm ___________________________________   Nodule # 1: Prior biopsy: No Location: Right; Inferior Maximum size: 1.5 cm; Other 2 dimensions: 1.5 x 0.7 cm, previously, 1.3 x 1.1 x 0.7 cm Composition: solid/almost completely solid (2) Echogenicity: hypoechoic (2) Significant change in size (>/= 20% in two dimensions and minimal increase of 2 mm): Yes Change in features: Yes nodule now appears hypoechoic, previously, isoechoic Change in ACR TI-RADS risk category: Yes previously a TR3 nodule, currently a TR4 nodule.   **Given size (>/= 1.5 cm) and appearance, fine needle aspiration of this moderately suspicious nodule should be considered based on TI-RADS  criteria. _________________________________________________________   The approximately 0.8 cm isoechoic ill-defined nodule/pseudonodule within the mid aspect the right lobe of the thyroid labeled 2), is unchanged compared to the 03/2017 examination, previously, 0.7 cm, and again does not meet imaging criteria to recommend percutaneous sampling or continued dedicated follow-up.   IMPRESSION: Nodule #1 within the inferior pole the right lobe of the thyroid has increased in size and now appears hypoechoic and as such has been up graded from a TR3 nodule to a TR4 nodule and now meets imaging criteria to recommend percutaneous sampling as indicated.    04/09/2019: FNA: Atypia of undetermined significance (Bethesda category III); Afirma: Suspicious (50% cancer risk)  06/08/2019: Right thyroid lobectomy: Adenomatous nodule, lymphocytic thyroiditis (benign)  PLAN:  1. Patient with history of subclinical thyrotoxicosis with elevated TSI antibodies, consistent with mild Graves' disease.  Thyroid uptake and scan showed an uptake at the upper limit of normal and a uniform scan with the exception of a small cold nodule in the right lobe of the thyroid.  At the time of the scan, TSH was slightly low, but this worsened afterwards.  We had to start low-dose methimazole.  However, we ended up proceeding with right hemithyroidectomy.  Afterwards, her subclinical thyrotoxicosis resolved. -At last visit, TSH was normal: Lab Results  Component Value Date   TSH 2.53 03/06/2022  -At previous visits she had fatigue and palpitations (she is on chronic beta-blockers for POTS).  However, she then started to exercise and was feeling better. -She continues to feel tired and was recently found to be menopausal.  We discussed about the possibility of using hormone replacement-bioidentical and she  will discuss about this with her OB/GYN provider. -We recheck her TFTs today -We did discuss about the possibility of  needing to start levothyroxine but this becomes more and more likely as the time goes on after her surgery -We will see her back in 1 year  2.  History of cold thyroid nodule -History of a right 1.5 cm thyroid nodule, slightly increased in size on the ultrasound from 2020 -She has occasional food getting stuck in her throat (not new), no neck compression symptoms now -She had a right hemithyroidectomy in 2021-benign pathology -Left thyroid lobe is palpable on exam, but without nodules -Her thyroid tests were normal at last check -No further imaging investigation is needed for this  Component     Latest Ref Rng 03/07/2023  TSH     0.35 - 5.50 uIU/mL 3.84   T4,Free(Direct)     0.60 - 1.60 ng/dL 9.51   Triiodothyronine,Free,Serum     2.3 - 4.2 pg/mL 3.4   TFTs are normal.  Carlus Pavlov, MD PhD [facility]

## 2023-03-12 ENCOUNTER — Other Ambulatory Visit: Payer: Self-pay | Admitting: Internal Medicine

## 2023-03-12 DIAGNOSIS — Z1231 Encounter for screening mammogram for malignant neoplasm of breast: Secondary | ICD-10-CM

## 2023-04-08 ENCOUNTER — Encounter: Payer: Self-pay | Admitting: Family Medicine

## 2023-04-08 ENCOUNTER — Ambulatory Visit: Payer: BC Managed Care – PPO | Admitting: Family Medicine

## 2023-04-08 VITALS — BP 106/70 | HR 76 | Temp 98.4°F | Resp 20 | Ht 66.0 in | Wt 180.0 lb

## 2023-04-08 DIAGNOSIS — J0110 Acute frontal sinusitis, unspecified: Secondary | ICD-10-CM

## 2023-04-08 DIAGNOSIS — R051 Acute cough: Secondary | ICD-10-CM

## 2023-04-08 LAB — POC COVID19 BINAXNOW: SARS Coronavirus 2 Ag: NEGATIVE

## 2023-04-08 MED ORDER — AZITHROMYCIN 250 MG PO TABS: ORAL_TABLET | ORAL | 0 refills | Status: AC

## 2023-04-08 MED ORDER — PREDNISONE 10 MG (21) PO TBPK: ORAL_TABLET | ORAL | 0 refills | Status: DC

## 2023-04-08 NOTE — Progress Notes (Signed)
Assessment & Plan:  1. Acute non-recurrent frontal sinusitis Education provided on sinus infections.  Encouraged to continue symptom management, Tylenol/Ibuprofen, Vicks, and a humidifier at night.  Patient states she has tolerated a Z-Pak in the past. - azithromycin (ZITHROMAX) 250 MG tablet; Take 2 tablets (500 mg total) by mouth daily for 1 day, THEN 1 tablet (250 mg total) daily for 4 days.  Dispense: 6 each; Refill: 0 - predniSONE (STERAPRED UNI-PAK 21 TAB) 10 MG (21) TBPK tablet; As directed x 6 days  Dispense: 21 tablet; Refill: 0 - POC COVID-19 BinaxNow  2. Acute cough - POC COVID-19 BinaxNow Results for orders placed or performed in visit on 04/08/23  POC COVID-19 BinaxNow  Result Value Ref Range   SARS Coronavirus 2 Ag Negative Negative     Follow up plan: Return if symptoms worsen or fail to improve.  Deliah Boston, MSN, APRN, FNP-C  Subjective:  HPI: Chelsey Sanford is a 47 y.o. female presenting on 04/08/2023 for URI (Cough with rattles, slight (mostly dry) production -brown to yellow, Ribs hurt and has some SOB, also has HA, sinus pressure and some drainage./Unsure about any fever. Started last WED )  Patient complains of cough, headache, facial pain/pressure, postnasal drainage, shortness of breath, and rib pain . She denies fever. Onset of symptoms was 5 days ago, unchanged since that time. She is drinking plenty of fluids. Evaluation to date: none. Treatment to date:  Mucinex day/night and Allegra . She has a history of allergic rhinitis. She does not smoke.    ROS: Negative unless specifically indicated above in HPI.   Relevant past medical history reviewed and updated as indicated.   Allergies and medications reviewed and updated.   Current Outpatient Medications:    Cholecalciferol (VITAMIN D) 50 MCG (2000 UT) CAPS, Take 2,000 Units by mouth., Disp: , Rfl:    clobetasol ointment (TEMOVATE) 0.05 %, , Disp: , Rfl:    docusate sodium (COLACE) 100 MG capsule,  Take 100 mg by mouth daily as needed for mild constipation., Disp: , Rfl:    fexofenadine (ALLEGRA ALLERGY) 60 MG tablet, Take 60 mg by mouth 2 (two) times daily., Disp: , Rfl:    ibuprofen (ADVIL) 600 MG tablet, , Disp: , Rfl:    Lactobacillus-Inulin (CULTURELLE DIGESTIVE HEALTH PO), Take 1 tablet by mouth daily. , Disp: , Rfl:    pantoprazole (PROTONIX) 40 MG tablet, TAKE 1 TABLET(40 MG) BY MOUTH DAILY, Disp: 90 tablet, Rfl: 3   pindolol (VISKEN) 5 MG tablet, TAKE 1 TABLET(5 MG) BY MOUTH TWICE DAILY, Disp: 180 tablet, Rfl: 1   rizatriptan (MAXALT-MLT) 10 MG disintegrating tablet, TAKE 1 TABLET BY MOUTH AS NEEDED FOR MIGRAINE, MAY REPEAT IN 2 HOURS IF NEEDED, Disp: 9 tablet, Rfl: 11   diphenhydrAMINE (BENADRYL) 25 MG tablet, Take 50 mg by mouth every 6 (six) hours as needed for itching or allergies. (Patient not taking: Reported on 04/08/2023), Disp: , Rfl:    magnesium oxide (MAG-OX) 400 MG tablet, Take 1 tablet (400 mg total) by mouth daily. (Patient not taking: Reported on 04/08/2023), Disp: 90 tablet, Rfl: 3  Allergies  Allergen Reactions   Adhesive [Tape] Rash and Other (See Comments)    Skin blistered and disintegrated after epidural (looked like 3rd degree burn) - possibly due to tape and/or tegaderm - do not use anything with adhesive   Sulfonamide Derivatives Nausea And Vomiting   Doxycycline Nausea And Vomiting   Sulfa Antibiotics Nausea And Vomiting   Amoxicillin Nausea  And Vomiting and Rash   Erythromycin Nausea And Vomiting and Rash   Latex Rash    See notes under adhesive tape   Penicillins Nausea And Vomiting and Rash    Has patient had a PCN reaction causing immediate rash, facial/tongue/throat swelling, SOB or lightheadedness with hypotension: Yes Has patient had a PCN reaction causing severe rash involving mucus membranes or skin necrosis: No Has patient had a PCN reaction that required hospitalization: No Has patient had a PCN reaction occurring within the last 10 years:  No If all of the above answers are "NO", then may proceed with Cephalosporin use.    Objective:   BP 106/70   Pulse 76   Temp 98.4 F (36.9 C)   Resp 20   Ht 5\' 6"  (1.676 m)   Wt 180 lb (81.6 kg)   LMP 03/15/2023 (Approximate)   SpO2 97%   BMI 29.05 kg/m    Physical Exam Vitals reviewed.  Constitutional:      General: She is not in acute distress.    Appearance: Normal appearance. She is not ill-appearing, toxic-appearing or diaphoretic.  HENT:     Head: Normocephalic and atraumatic.     Right Ear: Tympanic membrane, ear canal and external ear normal. There is no impacted cerumen.     Left Ear: Tympanic membrane, ear canal and external ear normal. There is no impacted cerumen.     Nose: Congestion present. No rhinorrhea.     Right Sinus: Frontal sinus tenderness present. No maxillary sinus tenderness.     Left Sinus: Frontal sinus tenderness present. No maxillary sinus tenderness.     Mouth/Throat:     Mouth: Mucous membranes are moist.     Pharynx: Oropharynx is clear. No oropharyngeal exudate or posterior oropharyngeal erythema.  Eyes:     General: No scleral icterus.       Right eye: No discharge.        Left eye: No discharge.     Conjunctiva/sclera: Conjunctivae normal.  Cardiovascular:     Rate and Rhythm: Normal rate and regular rhythm.     Heart sounds: Normal heart sounds. No murmur heard.    No friction rub. No gallop.  Pulmonary:     Effort: Pulmonary effort is normal. No respiratory distress.     Breath sounds: Normal breath sounds. No stridor. No wheezing, rhonchi or rales.  Chest:     Chest wall: Tenderness (between ribs) present.  Musculoskeletal:        General: Normal range of motion.     Cervical back: Normal range of motion.  Lymphadenopathy:     Cervical: No cervical adenopathy.  Skin:    General: Skin is warm and dry.     Capillary Refill: Capillary refill takes less than 2 seconds.  Neurological:     General: No focal deficit present.      Mental Status: She is alert and oriented to person, place, and time. Mental status is at baseline.  Psychiatric:        Mood and Affect: Mood normal.        Behavior: Behavior normal.        Thought Content: Thought content normal.        Judgment: Judgment normal.

## 2023-04-08 NOTE — Patient Instructions (Signed)
Continue symptom management, Tylenol/Ibuprofen, Vicks, and a humidifier at night.

## 2023-04-09 ENCOUNTER — Ambulatory Visit
Admission: RE | Admit: 2023-04-09 | Discharge: 2023-04-09 | Disposition: A | Discharge status: Home / Self Care | Payer: BC Managed Care – PPO | Source: Ambulatory Visit | Attending: Internal Medicine | Admitting: Internal Medicine

## 2023-04-09 DIAGNOSIS — Z1231 Encounter for screening mammogram for malignant neoplasm of breast: Secondary | ICD-10-CM

## 2023-04-12 ENCOUNTER — Other Ambulatory Visit: Payer: Self-pay | Admitting: Internal Medicine

## 2023-04-12 DIAGNOSIS — R928 Other abnormal and inconclusive findings on diagnostic imaging of breast: Secondary | ICD-10-CM

## 2023-05-02 ENCOUNTER — Ambulatory Visit
Admission: RE | Admit: 2023-05-02 | Discharge: 2023-05-02 | Disposition: A | Discharge status: Home / Self Care | Payer: BC Managed Care – PPO | Source: Ambulatory Visit | Attending: Internal Medicine | Admitting: Internal Medicine

## 2023-05-02 DIAGNOSIS — R92332 Mammographic heterogeneous density, left breast: Secondary | ICD-10-CM | POA: Diagnosis not present

## 2023-05-02 DIAGNOSIS — N6342 Unspecified lump in left breast, subareolar: Secondary | ICD-10-CM | POA: Diagnosis not present

## 2023-05-02 DIAGNOSIS — R928 Other abnormal and inconclusive findings on diagnostic imaging of breast: Secondary | ICD-10-CM

## 2023-06-14 ENCOUNTER — Other Ambulatory Visit: Payer: Self-pay | Admitting: Internal Medicine

## 2023-06-14 DIAGNOSIS — G90A Postural orthostatic tachycardia syndrome (POTS): Secondary | ICD-10-CM

## 2023-08-07 ENCOUNTER — Encounter: Payer: Self-pay | Admitting: Internal Medicine

## 2023-10-07 IMAGING — MG MM DIGITAL SCREENING BILAT W/ TOMO AND CAD
6 of 10 series · 6 of 30 positions shown · non-contrast
Comparison: Previous exam(s).

CLINICAL DATA: Screening.

EXAM:
DIGITAL SCREENING BILATERAL MAMMOGRAM WITH TOMOSYNTHESIS AND CAD
TECHNIQUE: Bilateral screening digital craniocaudal and mediolateral oblique
mammograms were obtained. Bilateral screening digital breast
tomosynthesis was performed. The images were evaluated with
computer-aided detection.

[L CC synth-2D]
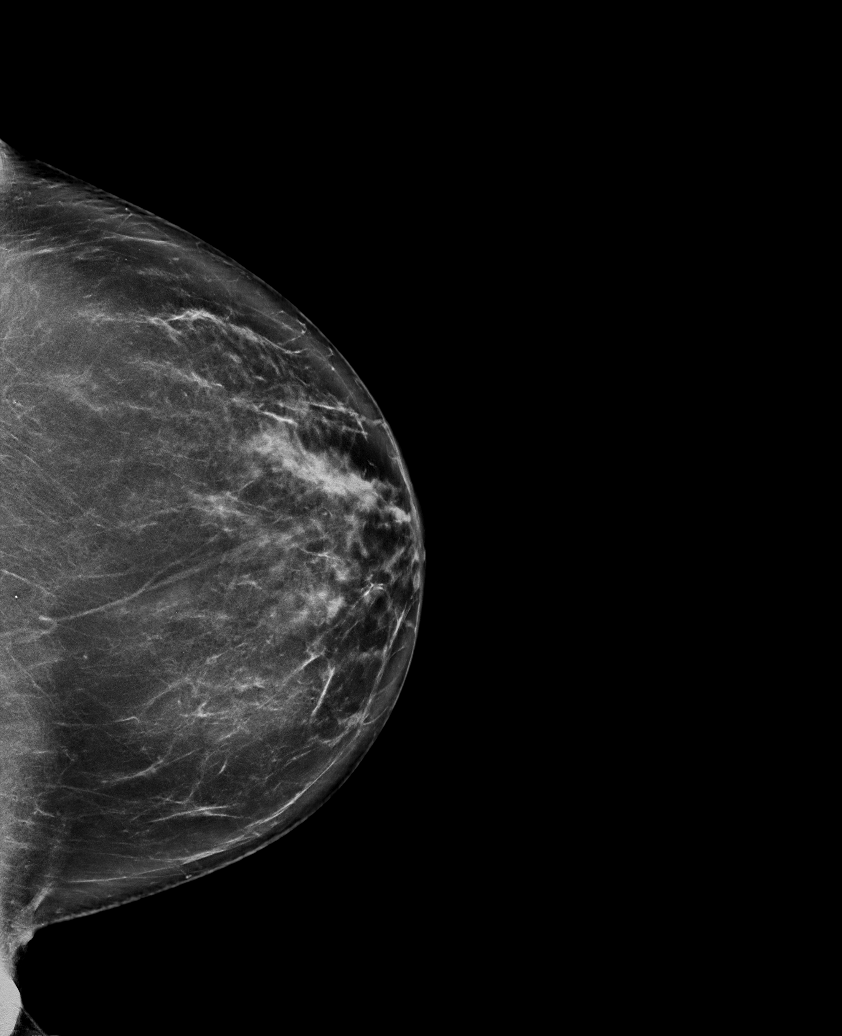

[R MLO synth-2D]
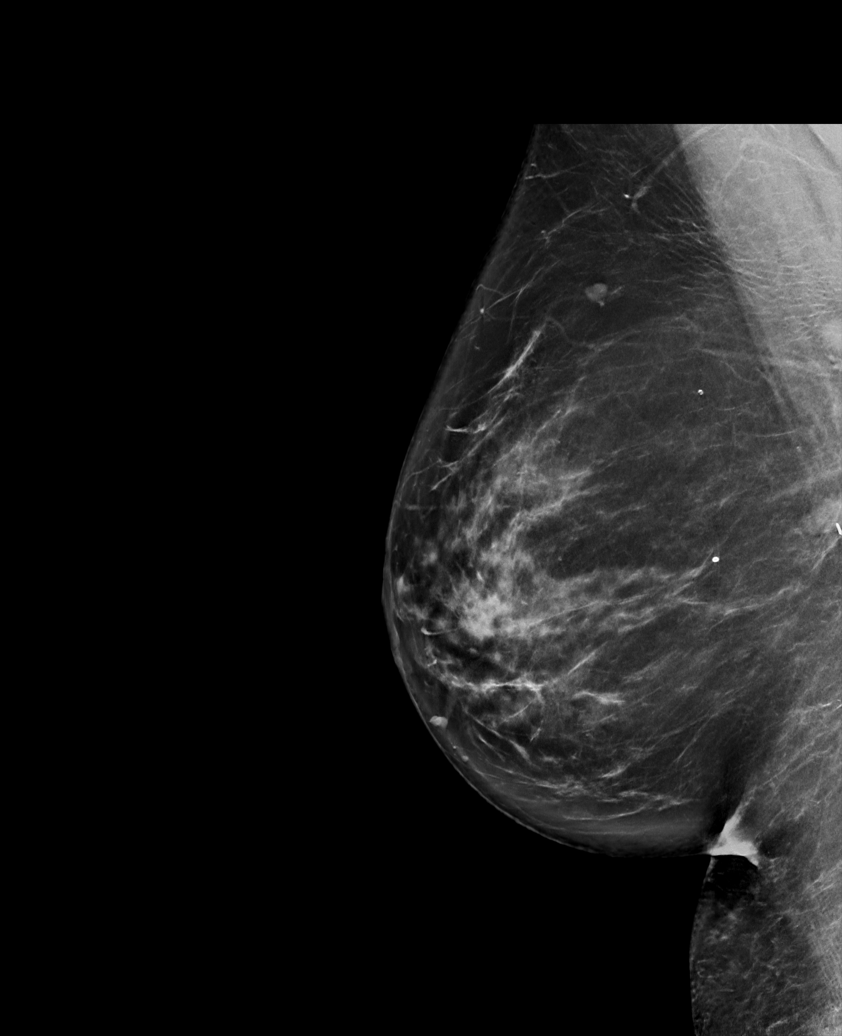

[L MLO synth-2D (1 of 2)]
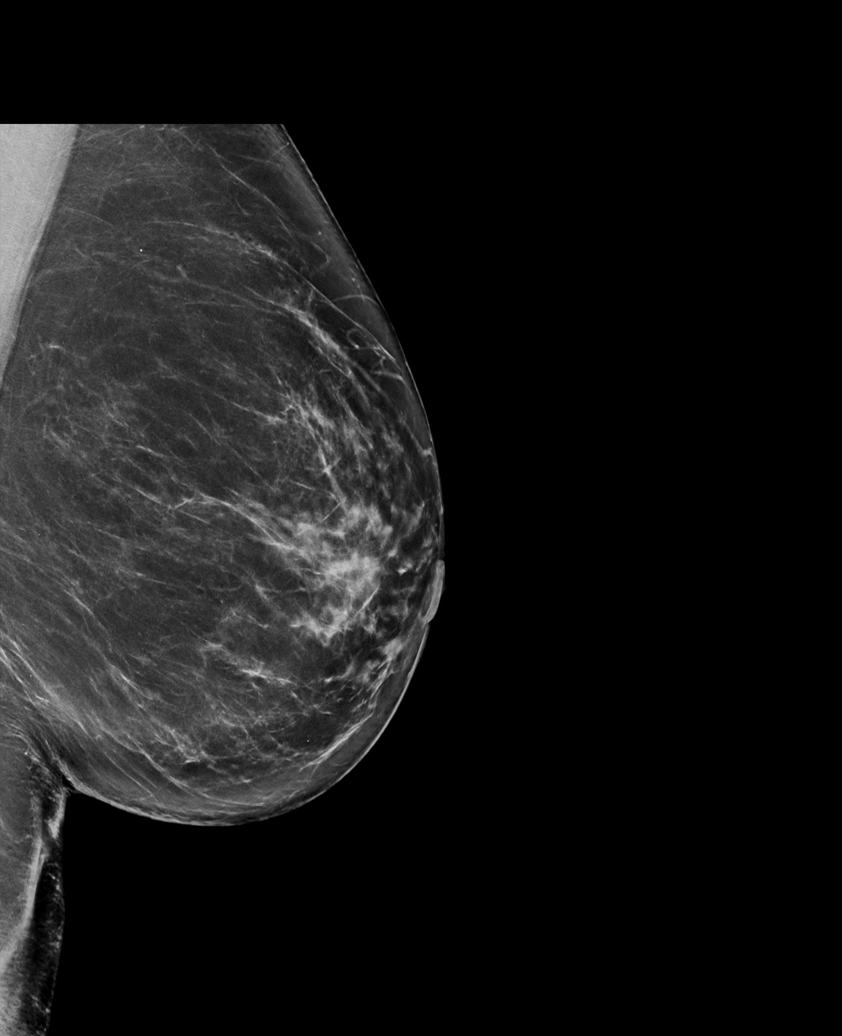

[L MLO synth-2D (2 of 2)]
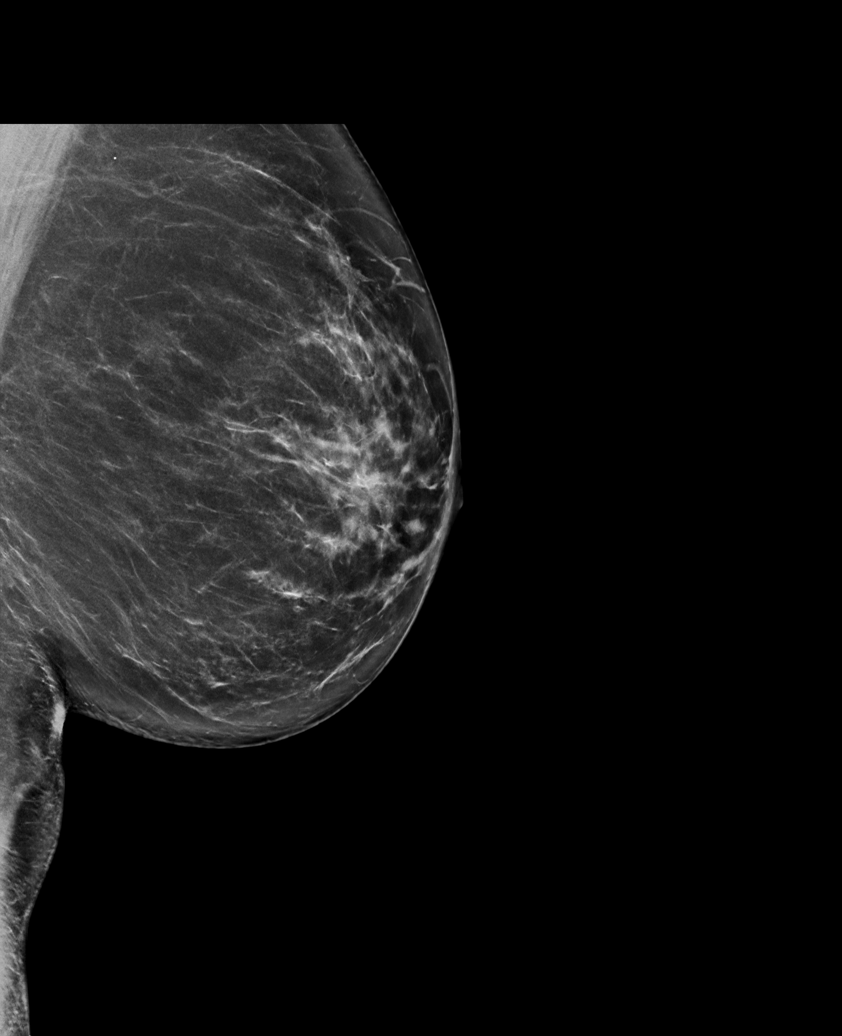

[R CC synth-2D]
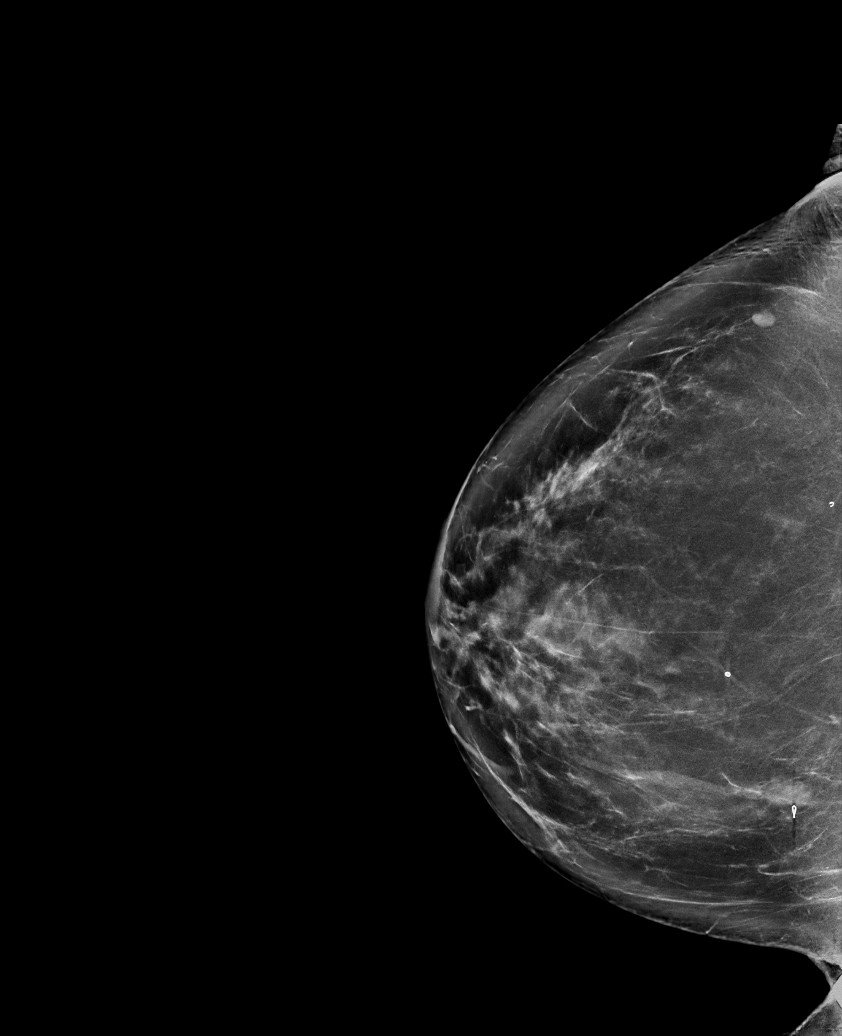

[R MLO tomo · tomo slice 49/96.0]
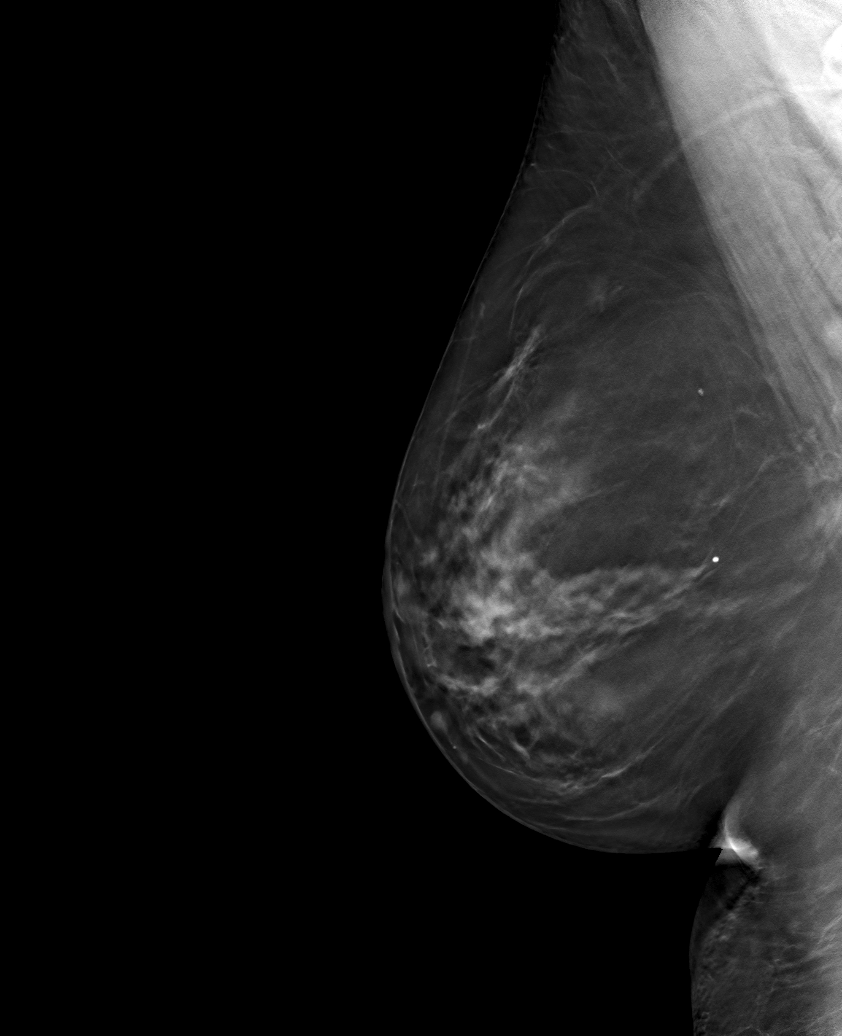

[6 of 30 positions shown; findings below may reference images not displayed]

ACR Breast Density Category c: The breast tissue is heterogeneously
dense, which may obscure small masses.
FINDINGS: There are no findings suspicious for malignancy.
IMPRESSION: No mammographic evidence of malignancy. A result letter of this
screening mammogram will be mailed directly to the patient.

RECOMMENDATION:
Screening mammogram in one year. (Code:Q3-W-BC3)

BI-RADS CATEGORY  1: Negative.

## 2024-02-27 ENCOUNTER — Other Ambulatory Visit: Payer: Self-pay | Admitting: Internal Medicine

## 2024-02-27 DIAGNOSIS — Z1231 Encounter for screening mammogram for malignant neoplasm of breast: Secondary | ICD-10-CM

## 2024-03-01 NOTE — Progress Notes (Addendum)
 Cardiology Office Note   Date: 03/10/2024   ID:  Chelsey Sanford, DOB [birth year], MRN [id number]  PCP:  Chelsey Sanford PARAS, MD  Cardiologist:   Chelsey Gull, MD   Pt presents for follow up  of autonomic dysfunciton      History of Present Illness: Chelsey Sanford is a 48 y.o. female with a history of autonomic dysfunction    Pt also had hx Hashimoto's   s/p thyroid  surgery.     Symptoms of severe dizziness, palpitations developed back in 2008    Pt treated with florinef , pindolol  and IV fluids through the years    She remained very active thorughout and has improved significantly      I last saw the pt in 2024   Since seen she says she is doing fairly well  Still has dizziness at times   No syncope   REmains active She has noticed some heart racing, like heart coming out of chest    Not prolonged   She says it is different than when her heart was racing due to dysautonomia   Again, no  syncope     She stays well  hydrated    Denies significant palpitations  Admits to being under increased stress recently   May be moving    Was racing to get to clinic today       Current Meds  Medication Sig   Cholecalciferol (VITAMIN D ) 50 MCG (2000 UT) CAPS Take 2,000 Units by mouth.   clobetasol  ointment (TEMOVATE ) 0.05 %    diphenhydrAMINE  (BENADRYL ) 25 MG tablet Take 50 mg by mouth every 6 (six) hours as needed for itching or allergies.   docusate sodium (COLACE) 100 MG capsule Take 100 mg by mouth daily as needed for mild constipation.   fexofenadine  (ALLEGRA  ALLERGY) 60 MG tablet Take 60 mg by mouth 2 (two) times daily.   ibuprofen  (ADVIL ) 600 MG tablet    Lactobacillus-Inulin (CULTURELLE DIGESTIVE HEALTH PO) Take 1 tablet by mouth daily.    magnesium  oxide (MAG-OX) 400 MG tablet Take 1 tablet (400 mg total) by mouth daily. (Patient taking differently: Take 400 mg by mouth daily. Takes a needed)   pantoprazole  (PROTONIX ) 40 MG tablet TAKE 1 TABLET(40 MG) BY MOUTH DAILY   pindolol  (VISKEN ) 5 MG  tablet TAKE 1 TABLET(5 MG) BY MOUTH TWICE DAILY   rizatriptan  (MAXALT -MLT) 10 MG disintegrating tablet TAKE 1 TABLET BY MOUTH AS NEEDED FOR MIGRAINE, MAY REPEAT IN 2 HOURS IF NEEDED     Allergies:   Adhesive [tape], Sulfonamide derivatives, Doxycycline , Sulfa antibiotics, Amoxicillin, Erythromycin, Latex, and Penicillins   Past Medical History:  Diagnosis Date   ALLERGIC RHINITIS    ANEMIA-NOS    hx of   Anxiety    Complication of anesthesia    Delayed gastric emptying    dx at [facility] by gastric emptying study per patient   DVT of axillary vein, acute (HCC)    DYSAUTONOMIA    ENDOMETRIOSIS    GERD (gastroesophageal reflux disease) 10/06/2011   with certain foods due to esophageal paralysis   Headache(784.0)    Irritable bowel syndrome    Lumbar disc disease    Migraine    since 6th grade   PONV (postoperative nausea and vomiting)    scope patch works well   POTS (postural orthostatic tachycardia syndrome)    not triggered by anesthesia. usually triggered by dehydration   Seasonal allergies    Sickle cell trait  Thyroid  disease    not on meds at this time (03/28/2021)- post parathyroidectomy    Past Surgical History:  Procedure Laterality Date   ANKLE RECONSTRUCTION Right 1993   BREAST BIOPSY Right 01/12/2015   fibroadenoma   CERVICAL FUSION  2021   COLONOSCOPY  2010   external provider-F/V-normal   ELBOW SURGERY Right 2015   SINUS EXPLORATION  2015   THYROID  LOBECTOMY Right 06/08/2019   Procedure: RIGHT THYROID  LOBECTOMY;  Surgeon: Chelsey Boas, MD;  Location: [facility] [place];  Service: General;  Laterality: Right;   TONSILLECTOMY  1996   WISDOM TOOTH EXTRACTION  1992     Social History:  The patient  reports that she quit smoking about 27 years ago. Her smoking use included cigarettes. She has never used smokeless tobacco. She reports current alcohol use of about 14.0 - 21.0 standard drinks of alcohol per week. She reports that she does not use drugs.    Family History:  The patient's family history includes Arthritis/Rheumatoid in her paternal grandmother; Breast cancer in her cousin; Cancer in her paternal grandfather; Colon polyps (age of onset: 75) in her mother; Crohn's disease in her maternal grandmother; Migraines in her mother; Parkinson's disease in her paternal grandfather and paternal grandmother; Skin cancer in her mother; Thyroid  disease in her mother.    ROS:  Please see the history of present illness. All other systems are reviewed and  Negative to the above problem except as noted.    PHYSICAL EXAM: VS:  BP (!) 140/102 (BP Location: Right Arm, Patient Position: Sitting, Cuff Size: Normal) Comment: Dynamapp Bp 146/87 Right arm  Pulse 65   Ht 5' 6.5 (1.689 m)   Wt 179 lb 4.8 oz (81.3 kg)   SpO2 98%   BMI 28.51 kg/m    GEN: Well nourished, well developed, in no acute distress  HEENT: normal  Neck: no JVD  Cardiac: RRR; no murmurs  Respiratory:  clear to auscultation  GI: soft, nontender, no masses Ext are without edema  Good distal pulses     EKG:  EKG is ordered today.  SR 77 bpm   Lipid Panel    Component Value Date/Time   CHOL 202 (H) 02/11/2020 0936   TRIG 90 02/11/2020 0936   HDL 58 02/11/2020 0936   CHOLHDL 3.5 02/11/2020 0936   CHOLHDL 4 05/27/2018 1029   VLDL 24.2 05/27/2018 1029   LDLCALC 128 (H) 02/11/2020 0936      Wt Readings from Last 3 Encounters:  03/10/24 179 lb 4.8 oz (81.3 kg)  03/06/24 176 lb (79.8 kg)  04/08/23 180 lb (81.6 kg)      ASSESSMENT AND PLAN:  1  Autonomic dysfunction  Pt is doing well overall   Still with dizziness at times but is able to remain very active   BP is actuatlly high today on arrival, though she says this is rare for her    usually 110s Keep on pindolol  and Mag Oxide    Stays hydrated.  Exercises regularly   2  Tachycardia   Different she says from what she has felt in past with POTS  Irregular  If she continues to have these spells will set up for   ZIo patch     3   Hx thyroid  dysfunciton     Pt with hx of probable Graves disease   Followed in endocrine  s/p R thyroidectomy in 2021 (inconclusive bx)   4  HL  Lipids have been elevated  LDL 146   Will check NMR panel  Reviewed diet   5  Metabolic  Check A1C,  Plan for f/u in 1 year     Current medicines are reviewed at length with the patient today.  The patient does not have concerns regarding medicine  Signed, Chelsey Gull, MD

## 2024-03-06 ENCOUNTER — Ambulatory Visit: Payer: BC Managed Care – PPO | Admitting: Internal Medicine

## 2024-03-06 ENCOUNTER — Encounter: Payer: Self-pay | Admitting: Internal Medicine

## 2024-03-06 VITALS — BP 114/80 | HR 67 | Resp 16 | Ht 66.0 in | Wt 176.0 lb

## 2024-03-06 DIAGNOSIS — E0500 Thyrotoxicosis with diffuse goiter without thyrotoxic crisis or storm: Secondary | ICD-10-CM

## 2024-03-06 DIAGNOSIS — E041 Nontoxic single thyroid nodule: Secondary | ICD-10-CM

## 2024-03-06 DIAGNOSIS — E890 Postprocedural hypothyroidism: Secondary | ICD-10-CM

## 2024-03-06 NOTE — Progress Notes (Addendum)
 Patient ID: Chelsey Sanford, female   DOB: 10/11/1975, 48 y.o.   MRN: [id number]   HPI  Chelsey Sanford is a 48 y.o.-year-old female, returning for follow-up for subclinical thyrotoxicosis (likely Graves' disease).  Last visit 1 year ago.  Interim history: She continues to exercise consistently.  She lost 6 pounds since last visit. She continues to have fatigue, aches and pains, and insomnia and before last visit she was found to be menopausal.  Reviewed history: Patient was found to have a low TSH on her annual physical exam in 11/2016. She was getting annual TFTs until then due to a family history of hypothyroidism in her mother.  The rest of her TSH levels have been normal since 2008.  Her thyroid  tests initially improved in 02/2017, but the latest set of tests worsened again (subclinical thyrotoxicosis).  We started 5 mg of methimazole  daily in 05/2017.  She started to feel better afterwards but she was lost for follow-up for 1 year and 7 months afterwards.  She then had R thyroidectomy in 06/2019 for a thyroid  nodule with inconclusive Bx. Final pathology was benign.  She was able to stop methimazole  after her thyroid  surgery.  We did not have to start levothyroxine afterwards.  Reviewed her TFTs: Lab Results  Component Value Date   TSH 3.84 03/07/2023   TSH 2.53 03/06/2022   TSH 3.27 03/02/2021   TSH 3.02 08/31/2020   TSH 3.940 02/11/2020   TSH 2.77 09/17/2019   TSH 3.16 08/05/2019   TSH 2.17 05/06/2019   TSH 2.58 02/27/2019   TSH 3.02 12/24/2017   FREET4 0.69 03/07/2023   FREET4 0.87 03/06/2022   FREET4 0.73 03/02/2021   FREET4 0.82 08/31/2020   FREET4 1.18 02/11/2020   FREET4 0.78 09/17/2019   FREET4 0.68 08/05/2019   FREET4 1.13 05/06/2019   FREET4 0.87 02/27/2019   FREET4 0.72 12/24/2017   T3FREE 3.4 03/07/2023   T3FREE 3.5 03/06/2022   T3FREE 3.3 03/02/2021   T3FREE 3.5 08/31/2020   T3FREE 3.3 02/11/2020   T3FREE 3.1 09/17/2019   T3FREE 3.1 08/05/2019   T3FREE  3.5 05/06/2019   T3FREE 3.3 02/27/2019   T3FREE 3.5 12/24/2017   Her TPO antibodies were elevated: Component     Latest Ref Rng & Units 12/26/2016  Thyroperoxidase Ab SerPl-aCnc     <9 IU/mL 129 (H)  Thyroglobulin Ab     <2 IU/mL 1   Her Graves' antibodies were elevated: Lab Results  Component Value Date   TSI 441 (H) 03/07/2017   Reviewed the rest of her studies/procedures: We obtained a thyroid  uptake and scan (04/03/2017): Uniform scan with the exception of a small right cold nodule, uptake at the upper limit of normal, 29%.  This was consistent with possible mild Graves' disease.  We then checked a thyroid  ultrasound (04/15/2017): Small, 1.2 cm isoechoic solid nodule in the right lobe.  Also, small, subcentimeter echogenic left nodules.  None of the nodules meet criteria for biopsy or follow-up.  We repeated a thyroid  ultrasound (04/01/2019): Her dominant right thyroid  nodule has increased (1.5 cm X 1.5 x 0.7 cm) in size and risk.  Biopsy was recommended.  FNA (04/09/2019): Inconclusive: Atypia of undetermined significance (Bethesda category III); Afirma molecular marker: Suspicious (50% cancer risk)  I referred her to surgery and she had Right thyroid  lobectomy (06/08/2019): Benign nodule, lymphocytic thyroiditis.  Pt denies: - hoarseness - dysphagia - choking - SOB with lying down She has food/pills stuck in throat at times.  Pt does have a FH of thyroid  ds. In Mother >> hypothyroidism. No FH of thyroid  cancer. No h/o radiation tx to head or neck. No herbal supplements. No Biotin use. No recent steroids use.   Pt. also has a history of POTS dx 2008 - managed by cardiology.  On beta-blocker (pindolol ). She also has B12 and vitamin D  deficiency. She has longstanding anemia. Also, eosinophilic esophagitis and hiatal hernia >> on Protonix . She has a h/o venous clot 2/2 iv infusion. Sister with unprovoked PE.  ROS: + See HPI  I reviewed pt's medications, allergies, PMH,  social hx, family hx, and changes were documented in the history of present illness. Otherwise, unchanged from my initial visit note.  Past Medical History:  Diagnosis Date   ALLERGIC RHINITIS    ANEMIA-NOS    hx of   Anxiety    Complication of anesthesia    Delayed gastric emptying    dx at [facility] by gastric emptying study per patient   DVT of axillary vein, acute (HCC)    DYSAUTONOMIA    ENDOMETRIOSIS    GERD (gastroesophageal reflux disease) 10/06/2011   with certain foods due to esophageal paralysis   Headache(784.0)    Irritable bowel syndrome    Lumbar disc disease    Migraine    since 6th grade   PONV (postoperative nausea and vomiting)    scope patch works well   POTS (postural orthostatic tachycardia syndrome)    not triggered by anesthesia. usually triggered by dehydration   Seasonal allergies    Sickle cell trait    Thyroid  disease    not on meds at this time (03/28/2021)- post parathyroidectomy   Past Surgical History:  Procedure Laterality Date   ANKLE RECONSTRUCTION Right 1993   BREAST BIOPSY Right 01/12/2015   fibroadenoma   CERVICAL FUSION  2021   COLONOSCOPY  2010   external provider-F/V-normal   ELBOW SURGERY Right 2015   SINUS EXPLORATION  2015   THYROID  LOBECTOMY Right 06/08/2019   Procedure: RIGHT THYROID  LOBECTOMY;  Surgeon: Eletha Boas, MD;  Location: [facility];  Service: General;  Laterality: Right;   TONSILLECTOMY  1996   WISDOM TOOTH EXTRACTION  1992   Social History   Socioeconomic History   Marital status: Married    Spouse name: Garnette   Number of children: 1   Years of education: 16   Highest education level: Not on file  Occupational History   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, Contracting and Tennis  Tobacco Use   Smoking status: Former Smoker    Last attempt to quit: 04/30/1996    Years since quitting: 20.8   Smokeless tobacco: Never Used  Substance and Sexual  Activity   Alcohol use: Yes    Comment: 1 time weekly - 2 drinks of winr   Drug use: No   Sexual activity: Yes    Birth control/protection: Pill  Other Topics Concern   Not on file  Social History Narrative   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     clobetasol  ointment (TEMOVATE )  0.05 %      diphenhydrAMINE  (BENADRYL ) 25 MG tablet Take 50 mg by mouth every 6 (six) hours as needed for itching or allergies. (Patient not taking: Reported on 04/08/2023)     docusate sodium (COLACE) 100 MG capsule Take 100 mg by mouth daily as needed for mild constipation.     fexofenadine  (ALLEGRA  ALLERGY) 60 MG tablet Take 60 mg by mouth 2 (two) times daily.     ibuprofen  (ADVIL ) 600 MG tablet      Lactobacillus-Inulin (CULTURELLE DIGESTIVE HEALTH PO) Take 1 tablet by mouth daily.      magnesium  oxide (MAG-OX) 400 MG tablet Take 1 tablet (400 mg total) by mouth daily. (Patient not taking: Reported on 04/08/2023) 90 tablet 3   pantoprazole  (PROTONIX ) 40 MG tablet TAKE 1 TABLET(40 MG) BY MOUTH DAILY 90 tablet 3   pindolol  (VISKEN ) 5 MG tablet TAKE 1 TABLET(5 MG) BY MOUTH TWICE DAILY 180 tablet 2   predniSONE  (STERAPRED UNI-PAK 21 TAB) 10 MG (21) TBPK tablet As directed x 6 days 21 tablet 0   rizatriptan  (MAXALT -MLT) 10 MG disintegrating tablet TAKE 1 TABLET BY MOUTH AS NEEDED FOR MIGRAINE, MAY REPEAT IN 2 HOURS IF NEEDED 9 tablet 11   No current facility-administered medications on file prior to visit.   Allergies  Allergen Reactions   Adhesive [Tape] Rash and Other (See Comments)    Skin blistered and disintegrated after epidural (looked like 3rd degree burn) - possibly due to  tape and/or tegaderm - do not use anything with adhesive   Sulfonamide Derivatives Nausea And Vomiting   Doxycycline  Nausea And Vomiting   Sulfa Antibiotics Nausea And Vomiting   Amoxicillin Nausea And Vomiting and Rash   Erythromycin Nausea And Vomiting and Rash   Latex Rash    See notes under adhesive tape   Penicillins Nausea And Vomiting and Rash    Has patient had a PCN reaction causing immediate rash, facial/tongue/throat swelling, SOB or lightheadedness with hypotension: Yes Has patient had a PCN reaction causing severe rash involving mucus membranes or skin necrosis: No Has patient had a PCN reaction that required hospitalization: No Has patient had a PCN reaction occurring within the last 10 years: No If all of the above answers are NO, then may proceed with Cephalosporin use.   Family History  Problem Relation Age of Onset   Thyroid  disease Mother        hypothyroidism   Migraines Mother    Colon polyps Mother 79   Skin cancer Mother        malignant melanoma   Crohn's disease Maternal Grandmother    Parkinson's disease Paternal Grandmother    Arthritis/Rheumatoid Paternal Grandmother    Cancer Paternal Grandfather        skin   Parkinson's disease Paternal Grandfather    Breast cancer Cousin    COPD Neg Hx    Colon cancer Neg Hx    Esophageal cancer Neg Hx    Rectal cancer Neg Hx    Stomach cancer Neg Hx    PE: BP 114/80   Pulse 67   Resp 16   Ht 5' 6 (1.676 m)   Wt 176 lb (79.8 kg)   SpO2 99%   BMI 28.41 kg/m  Wt Readings from Last 10 Encounters:  03/06/24 176 lb (79.8 kg)  04/08/23 180 lb (81.6 kg)  03/07/23 182 lb (82.6 kg)  01/02/23 179 lb (81.2 kg)  05/30/22 186 lb (84.4 kg)  03/06/22 183 lb 9.6 oz (  83.3 kg)  07/03/21 184 lb 3.2 oz (83.6 kg)  04/13/21 175 lb (79.4 kg)  03/28/21 175 lb (79.4 kg)  03/02/21 179 lb 6.4 oz (81.4 kg)   Constitutional: overweight, in NAD Eyes: EOMI, no exophthalmos ENT: no neck masses palpated, thyroidectomy scar  healed, no cervical lymphadenopathy Cardiovascular: RRR, No MRG Respiratory: CTA B Musculoskeletal: no deformities Skin:no rashes Neurological: + Very faint tremor with outstretched hands  ASSESSMENT: 1.  Graves' disease - Thyroid  uptake and scan (04/03/2017): Upper normal 24 hour radio iodine uptake of 29%. Tiny cold nodule at inferior pole of RIGHT thyroid  lobe without dominant thyroid  mass.  2.  Small thyroid  nodule - now s/p right hemithyroidectomy - Thyroid  ultrasound (04/15/2017): Parenchymal Echotexture: Moderately heterogenous Isthmus: 0.3 cm Right lobe: 4.2 x 1.4 x 1.7 cm Left lobe: 4.2 x 1.4 x 1.6 cm _________________________________________________________ Nodule # 1: Location: Right; Inferior Maximum size: 1.2 cm; Other 2 dimensions: 0.8 x 0.8 cm Composition: solid/almost completely solid (2) Echogenicity: isoechoic (1) Given size (<1.4 cm) and appearance, this nodule does NOT meet TI-RADS criteria for biopsy or dedicated follow-up. __________________________________________  An additional small, subcentimeter echogenic nodules present in the left mid gland. This nodule does not meet criteria for further evaluation.  IMPRESSION: Bilateral thyroid  nodules which do not meet criteria for further evaluation. No further follow-up is required.  Thyroid  U/S (04/01/2019): Parenchymal Echotexture: Mildly heterogenous Isthmus: Normal in size measures 0.5 cm in diameter, unchanged Right lobe: Normal in size measuring 4.3 x 1.3 x 1.8 cm, unchanged, previously, 4.2 x 1.4 x 1.7 cm Left lobe: Normal in size measuring 4.4 x 1.6 x 1.7 cm, unchanged, previously, 4.2 x 1.4 x 1.6 cm ___________________________________   Nodule # 1: Prior biopsy: No Location: Right; Inferior Maximum size: 1.5 cm; Other 2 dimensions: 1.5 x 0.7 cm, previously, 1.3 x 1.1 x 0.7 cm Composition: solid/almost completely solid (2) Echogenicity: hypoechoic (2) Significant change in size (>/= 20% in two  dimensions and minimal increase of 2 mm): Yes Change in features: Yes nodule now appears hypoechoic, previously, isoechoic Change in ACR TI-RADS risk category: Yes previously a TR3 nodule, currently a TR4 nodule.   **Given size (>/= 1.5 cm) and appearance, fine needle aspiration of this moderately suspicious nodule should be considered based on TI-RADS criteria. _________________________________________________________   The approximately 0.8 cm isoechoic ill-defined nodule/pseudonodule within the mid aspect the right lobe of the thyroid  labeled 2), is unchanged compared to the 03/2017 examination, previously, 0.7 cm, and again does not meet imaging criteria to recommend percutaneous sampling or continued dedicated follow-up.   IMPRESSION: Nodule #1 within the inferior pole the right lobe of the thyroid  has increased in size and now appears hypoechoic and as such has been up graded from a TR3 nodule to a TR4 nodule and now meets imaging criteria to recommend percutaneous sampling as indicated.    04/09/2019: FNA: Atypia of undetermined significance (Bethesda category III); Afirma: Suspicious (50% cancer risk)  06/08/2019: Right thyroid  lobectomy: Adenomatous nodule, lymphocytic thyroiditis (benign)  PLAN:  1. Patient with history of subclinical thyrotoxicosis, with elevated TSI antibodies, consistent with mild Graves' disease.  Thyroid  uptake and scan showed an uptake at the upper limit of normal in the uniform scan with the exception of a small cold nodule in the right lobe.  At the time of the scan, TSH was slightly low, but it worsened afterwards so we had to start low-dose methimazole .  However, after right hemithyroidectomy her subclinical thyrotoxicosis resolved and the last TSH was  normal: Lab Results  Component Value Date   TSH 3.84 03/07/2023  - No thyrotoxic symptoms.  She did lose 6 pounds since last visit, but does not have tachycardia or other thyrotoxic symptoms.  She  had palpitations previously - she is on beta-blockers for POTS -improved after starting to exercise.  At last visit, she had increased fatigue, insomnia, and joint aches, which continue now.  She is menopausal and we discussed about discussing with her OB/GYN provider if she qualified for hormone replacement therapy.  She plans to do so at next visit with OB/GYN. - Will recheck her TFTs today - We did discuss about possibly discharging her to PCP with annual TSH checks.  She agrees with this.  However, if the labs are abnormal today, we discussed about coming back to see me in another year.  2.  History of cold thyroid  nodule - Patient has a history of a right 1.5 cm thyroid  nodule, slightly increased in size on the ultrasound from 2020.  She had right hemithyroidectomy in 2021, with benign pathology -The left thyroid  lobe is palpable on exam but without nodules -No neck compression symptoms other than occasional food getting stuck in her throat, which is not new -No further imaging investigation is needed for this, but will continue to follow her clinically  Component     Latest Ref Rng 03/06/2024  Triiodothyronine,Free,Serum     2.3 - 4.2 pg/mL 3.0   T4,Free(Direct)     0.8 - 1.8 ng/dL 1.0   TSH     mIU/L 7.33   TFTs are normal.  Lela Fendt, MD PhD [facility] Endocrinology

## 2024-03-06 NOTE — Patient Instructions (Signed)
 Please stop at the lab.  Please come back for a follow-up appointment in 1 year.

## 2024-03-07 LAB — T4, FREE: Free T4: 1 ng/dL (ref 0.8–1.8)

## 2024-03-07 LAB — T3, FREE: T3, Free: 3 pg/mL (ref 2.3–4.2)

## 2024-03-07 LAB — TSH: TSH: 2.66 m[IU]/L

## 2024-03-09 ENCOUNTER — Ambulatory Visit: Payer: Self-pay | Admitting: Internal Medicine

## 2024-03-10 ENCOUNTER — Encounter: Payer: Self-pay | Admitting: Internal Medicine

## 2024-03-10 ENCOUNTER — Ambulatory Visit: Attending: Internal Medicine | Admitting: Internal Medicine

## 2024-03-10 VITALS — BP 140/102 | HR 65 | Ht 66.5 in | Wt 179.3 lb

## 2024-03-10 DIAGNOSIS — Z23 Encounter for immunization: Secondary | ICD-10-CM | POA: Diagnosis not present

## 2024-03-10 DIAGNOSIS — D225 Melanocytic nevi of trunk: Secondary | ICD-10-CM | POA: Diagnosis not present

## 2024-03-10 DIAGNOSIS — Z13220 Encounter for screening for lipoid disorders: Secondary | ICD-10-CM | POA: Diagnosis not present

## 2024-03-10 DIAGNOSIS — Z86018 Personal history of other benign neoplasm: Secondary | ICD-10-CM | POA: Diagnosis not present

## 2024-03-10 DIAGNOSIS — L821 Other seborrheic keratosis: Secondary | ICD-10-CM | POA: Diagnosis not present

## 2024-03-10 DIAGNOSIS — L719 Rosacea, unspecified: Secondary | ICD-10-CM | POA: Diagnosis not present

## 2024-03-10 DIAGNOSIS — E0590 Thyrotoxicosis, unspecified without thyrotoxic crisis or storm: Secondary | ICD-10-CM

## 2024-03-10 DIAGNOSIS — G90A Postural orthostatic tachycardia syndrome (POTS): Secondary | ICD-10-CM | POA: Diagnosis not present

## 2024-03-10 DIAGNOSIS — E559 Vitamin D deficiency, unspecified: Secondary | ICD-10-CM | POA: Diagnosis not present

## 2024-03-10 NOTE — Patient Instructions (Signed)
 Medication Instructions:  Your physician recommends that you continue on your current medications as directed. Please refer to the Current Medication list given to you today.  *If you need a refill on your cardiac medications before your next appointment, please call your pharmacy*  Lab Work: TODAY: NMR, Hgb A1c, Apo B, vitamin D , CBC If you have labs (blood work) drawn today and your tests are completely normal, you will receive your results only by: MyChart Message (if you have MyChart) OR A paper copy in the mail If you have any lab test that is abnormal or we need to change your treatment, we will call you to review the results.  Follow-Up: At [facility], you and your health needs are our priority.  As part of our continuing mission to provide you with exceptional heart care, our providers are all part of one team.  This team includes your primary Cardiologist (physician) and Advanced Practice Providers or APPs (Physician Assistants and Nurse Practitioners) who all work together to provide you with the care you need, when you need it.  Your next appointment:   9 month(s)  Provider:   Vina Gull, MD   We recommend signing up for the patient portal called MyChart.  Sign up information is provided on this After Visit Summary.  MyChart is used to connect with patients for Virtual Visits (Telemedicine).  Patients are able to view lab/test results, encounter notes, upcoming appointments, etc.  Non-urgent messages can be sent to your provider as well.    To learn more about what you can do with MyChart, go to [email or web address].

## 2024-03-19 DIAGNOSIS — E0590 Thyrotoxicosis, unspecified without thyrotoxic crisis or storm: Secondary | ICD-10-CM | POA: Diagnosis not present

## 2024-03-19 DIAGNOSIS — G90A Postural orthostatic tachycardia syndrome (POTS): Secondary | ICD-10-CM | POA: Diagnosis not present

## 2024-03-19 DIAGNOSIS — E559 Vitamin D deficiency, unspecified: Secondary | ICD-10-CM | POA: Diagnosis not present

## 2024-03-19 DIAGNOSIS — Z13220 Encounter for screening for lipoid disorders: Secondary | ICD-10-CM | POA: Diagnosis not present

## 2024-03-23 ENCOUNTER — Ambulatory Visit: Payer: Self-pay | Admitting: Internal Medicine

## 2024-03-23 DIAGNOSIS — Z79899 Other long term (current) drug therapy: Secondary | ICD-10-CM

## 2024-03-23 DIAGNOSIS — E785 Hyperlipidemia, unspecified: Secondary | ICD-10-CM

## 2024-03-24 ENCOUNTER — Other Ambulatory Visit: Payer: Self-pay

## 2024-03-24 DIAGNOSIS — E785 Hyperlipidemia, unspecified: Secondary | ICD-10-CM

## 2024-03-24 DIAGNOSIS — Z79899 Other long term (current) drug therapy: Secondary | ICD-10-CM

## 2024-03-24 MED ORDER — ROSUVASTATIN CALCIUM 5 MG PO TABS: 5.0000 mg | ORAL_TABLET | Freq: Every day | ORAL | 3 refills | Status: AC

## 2024-03-29 LAB — NMR, LIPOPROFILE
Cholesterol, Total: 238 mg/dL — ABNORMAL HIGH (ref 100–199)
HDL Particle Number: 42.7 umol/L (ref >=30.5)
HDL-C: 63 mg/dL (ref >39)
LDL Particle Number: 1702 nmol/L — ABNORMAL HIGH (ref <1000)
LDL Size: 20.5 nm — ABNORMAL LOW (ref >20.5)
LDL-C (NIH Calc): 159 mg/dL — ABNORMAL HIGH (ref 0–99)
LP-IR Score: 52 — ABNORMAL HIGH (ref <=45)
Small LDL Particle Number: 723 nmol/L — ABNORMAL HIGH (ref <=527)
Triglycerides: 92 mg/dL (ref 0–149)

## 2024-03-29 LAB — CBC
Hematocrit: 44 % (ref 34.0–46.6)
Hemoglobin: 14.2 g/dL (ref 11.1–15.9)
MCH: 30.1 pg (ref 26.6–33.0)
MCHC: 32.3 g/dL (ref 31.5–35.7)
MCV: 93 fL (ref 79–97)
Platelets: 270 x10E3/uL (ref 150–450)
RBC: 4.72 x10E6/uL (ref 3.77–5.28)
RDW: 13.1 % (ref 11.7–15.4)
WBC: 8.1 x10E3/uL (ref 3.4–10.8)

## 2024-03-29 LAB — HEMOGLOBIN A1C
Est. average glucose Bld gHb Est-mCnc: 97 mg/dL
Hgb A1c MFr Bld: 5 % (ref 4.8–5.6)

## 2024-03-29 LAB — VITAMIN D, 25-HYDROXY, TOTAL: Vitamin D, 25-Hydroxy, Serum: 73 ng/mL

## 2024-03-29 LAB — APOLIPOPROTEIN B: Apolipoprotein B: 117 mg/dL — ABNORMAL HIGH (ref <90)

## 2024-05-04 ENCOUNTER — Ambulatory Visit
Admission: RE | Admit: 2024-05-04 | Discharge: 2024-05-04 | Disposition: A | Discharge status: Home / Self Care | Source: Ambulatory Visit | Attending: Internal Medicine | Admitting: Internal Medicine

## 2024-05-04 DIAGNOSIS — Z1231 Encounter for screening mammogram for malignant neoplasm of breast: Secondary | ICD-10-CM

## 2024-05-28 ENCOUNTER — Telehealth: Payer: Self-pay | Admitting: Internal Medicine

## 2024-05-28 NOTE — Telephone Encounter (Signed)
 Caron with [employer] called about having Dr. Okey fill out a questionnaire regarding pt's POTS condition. He states he will fax it over today. Informed him ill send a message so they can be on the lookout for it.
